# Patient Record
Sex: Female | Born: 1960 | Race: Black or African American | Hispanic: No | Smoking: Never smoker
Health system: Southern US, Community
[De-identification: ages and names within clinical notes are randomized; demographics above are authoritative.]

## PROBLEM LIST (undated history)

## (undated) DIAGNOSIS — F419 Anxiety disorder, unspecified: Secondary | ICD-10-CM

## (undated) DIAGNOSIS — K219 Gastro-esophageal reflux disease without esophagitis: Secondary | ICD-10-CM

## (undated) DIAGNOSIS — K529 Noninfective gastroenteritis and colitis, unspecified: Secondary | ICD-10-CM

## (undated) DIAGNOSIS — D649 Anemia, unspecified: Secondary | ICD-10-CM

## (undated) DIAGNOSIS — N938 Other specified abnormal uterine and vaginal bleeding: Secondary | ICD-10-CM

## (undated) DIAGNOSIS — R51 Headache: Secondary | ICD-10-CM

## (undated) DIAGNOSIS — M797 Fibromyalgia: Secondary | ICD-10-CM

## (undated) DIAGNOSIS — M1990 Unspecified osteoarthritis, unspecified site: Secondary | ICD-10-CM

## (undated) DIAGNOSIS — Z9289 Personal history of other medical treatment: Secondary | ICD-10-CM

## (undated) HISTORY — DX: Fibromyalgia: M79.7

## (undated) HISTORY — PX: COLONOSCOPY: SHX174

## (undated) HISTORY — DX: Noninfective gastroenteritis and colitis, unspecified: K52.9

## (undated) HISTORY — PX: MULTIPLE TOOTH EXTRACTIONS: SHX2053

## (undated) HISTORY — DX: Unspecified osteoarthritis, unspecified site: M19.90

## (undated) HISTORY — DX: Other specified abnormal uterine and vaginal bleeding: N93.8

## (undated) HISTORY — DX: Personal history of other medical treatment: Z92.89

## (undated) HISTORY — DX: Anxiety disorder, unspecified: F41.9

## (undated) HISTORY — DX: Gastro-esophageal reflux disease without esophagitis: K21.9

## (undated) HISTORY — DX: Anemia, unspecified: D64.9

---

## 1998-03-03 HISTORY — PX: TUBAL LIGATION: SHX77

## 1998-05-04 ENCOUNTER — Ambulatory Visit (HOSPITAL_COMMUNITY): Admission: RE | Admit: 1998-05-04 | Discharge: 1998-05-04 | Payer: Self-pay | Admitting: Obstetrics and Gynecology

## 1999-04-19 ENCOUNTER — Encounter: Admission: RE | Admit: 1999-04-19 | Discharge: 1999-04-19 | Payer: Self-pay | Admitting: Family Medicine

## 1999-04-19 ENCOUNTER — Encounter: Payer: Self-pay | Admitting: Family Medicine

## 2000-06-29 ENCOUNTER — Encounter: Payer: Self-pay | Admitting: Obstetrics and Gynecology

## 2000-06-29 ENCOUNTER — Encounter: Admission: RE | Admit: 2000-06-29 | Discharge: 2000-06-29 | Payer: Self-pay | Admitting: Obstetrics and Gynecology

## 2000-07-12 ENCOUNTER — Emergency Department (HOSPITAL_COMMUNITY): Admission: EM | Admit: 2000-07-12 | Discharge: 2000-07-12 | Payer: Self-pay | Admitting: Emergency Medicine

## 2000-07-31 ENCOUNTER — Encounter: Admission: RE | Admit: 2000-07-31 | Discharge: 2000-07-31 | Payer: Self-pay | Admitting: Family Medicine

## 2000-07-31 ENCOUNTER — Encounter: Payer: Self-pay | Admitting: Family Medicine

## 2001-04-23 ENCOUNTER — Encounter: Payer: Self-pay | Admitting: Family Medicine

## 2001-04-23 ENCOUNTER — Encounter: Admission: RE | Admit: 2001-04-23 | Discharge: 2001-04-23 | Payer: Self-pay | Admitting: Family Medicine

## 2001-07-12 ENCOUNTER — Other Ambulatory Visit: Admission: RE | Admit: 2001-07-12 | Discharge: 2001-07-12 | Payer: Self-pay | Admitting: Obstetrics and Gynecology

## 2001-07-26 ENCOUNTER — Encounter: Admission: RE | Admit: 2001-07-26 | Discharge: 2001-07-26 | Payer: Self-pay | Admitting: Family Medicine

## 2001-07-26 ENCOUNTER — Encounter: Payer: Self-pay | Admitting: Family Medicine

## 2002-09-30 ENCOUNTER — Other Ambulatory Visit: Admission: RE | Admit: 2002-09-30 | Discharge: 2002-09-30 | Payer: Self-pay | Admitting: Obstetrics and Gynecology

## 2003-05-05 ENCOUNTER — Encounter: Admission: RE | Admit: 2003-05-05 | Discharge: 2003-05-05 | Payer: Self-pay | Admitting: Family Medicine

## 2005-05-05 ENCOUNTER — Other Ambulatory Visit: Admission: RE | Admit: 2005-05-05 | Discharge: 2005-05-05 | Payer: Self-pay | Admitting: Obstetrics and Gynecology

## 2005-11-13 ENCOUNTER — Encounter: Admission: RE | Admit: 2005-11-13 | Discharge: 2005-11-13 | Payer: Self-pay | Admitting: Family Medicine

## 2005-12-25 ENCOUNTER — Ambulatory Visit: Payer: Self-pay | Admitting: Gastroenterology

## 2006-02-17 ENCOUNTER — Ambulatory Visit: Payer: Self-pay | Admitting: Gastroenterology

## 2011-04-22 ENCOUNTER — Other Ambulatory Visit (HOSPITAL_COMMUNITY): Payer: Self-pay | Admitting: Family Medicine

## 2011-04-22 DIAGNOSIS — N938 Other specified abnormal uterine and vaginal bleeding: Secondary | ICD-10-CM

## 2011-04-22 DIAGNOSIS — D649 Anemia, unspecified: Secondary | ICD-10-CM

## 2011-04-28 ENCOUNTER — Ambulatory Visit (HOSPITAL_COMMUNITY)
Admission: RE | Admit: 2011-04-28 | Discharge: 2011-04-28 | Disposition: A | Discharge status: Home / Self Care | Payer: Self-pay | Source: Ambulatory Visit | Attending: Family Medicine | Admitting: Family Medicine

## 2011-04-28 DIAGNOSIS — N949 Unspecified condition associated with female genital organs and menstrual cycle: Secondary | ICD-10-CM | POA: Insufficient documentation

## 2011-04-28 DIAGNOSIS — D649 Anemia, unspecified: Secondary | ICD-10-CM | POA: Insufficient documentation

## 2011-04-28 DIAGNOSIS — N938 Other specified abnormal uterine and vaginal bleeding: Secondary | ICD-10-CM | POA: Insufficient documentation

## 2011-05-14 ENCOUNTER — Encounter (HOSPITAL_COMMUNITY): Payer: Self-pay | Admitting: *Deleted

## 2011-05-14 ENCOUNTER — Inpatient Hospital Stay (HOSPITAL_COMMUNITY)
Admission: AD | Admit: 2011-05-14 | Discharge: 2011-05-14 | Disposition: A | Discharge status: Home / Self Care | Payer: Self-pay | Source: Ambulatory Visit | Attending: Obstetrics & Gynecology | Admitting: Obstetrics & Gynecology

## 2011-05-14 DIAGNOSIS — N921 Excessive and frequent menstruation with irregular cycle: Secondary | ICD-10-CM

## 2011-05-14 DIAGNOSIS — N920 Excessive and frequent menstruation with regular cycle: Secondary | ICD-10-CM | POA: Insufficient documentation

## 2011-05-14 HISTORY — DX: Headache: R51

## 2011-05-14 LAB — WET PREP, GENITAL
Clue Cells Wet Prep HPF POC: NONE SEEN
Yeast Wet Prep HPF POC: NONE SEEN

## 2011-05-14 LAB — CBC
MCH: 26.1 pg (ref 26.0–34.0)
MCV: 82.6 fL (ref 78.0–100.0)
Platelets: 329 10*3/uL (ref 150–400)
RDW: 15.2 % (ref 11.5–15.5)
WBC: 6.6 10*3/uL (ref 4.0–10.5)

## 2011-05-14 MED ORDER — MEDROXYPROGESTERONE ACETATE 10 MG PO TABS: 10.0000 mg | ORAL_TABLET | Freq: Two times a day (BID) | ORAL | Status: DC

## 2011-05-14 NOTE — Discharge Instructions (Signed)
Endometrial Biopsy This is a test in which a tissue sample (a biopsy) is taken from inside the uterus (womb). It is then looked at by a specialist under a microscope to see if the tissue is normal or abnormal. The endometrium is the lining of the uterus. This test helps determine where you are in your menstrual cycle and how hormone levels are affecting the lining of the uterus. Another use for this test is to diagnose endometrial cancer, tuberculosis, polyps, or inflammatory conditions and to evaluate uterine bleeding. PREPARATION FOR TEST No preparation or fasting is necessary. NORMAL FINDINGS No pathologic conditions. Presence of "secretory-type" endometrium 3 to 5 days before to normal menstruation. Ranges for normal findings may vary among different laboratories and hospitals. You should always check with your doctor after having lab work or other tests done to discuss the meaning of your test results and whether your values are considered within normal limits. MEANING OF TEST  Your caregiver will go over the test results with you and discuss the importance and meaning of your results, as well as treatment options and the need for additional tests if necessary. OBTAINING THE TEST RESULTS It is your responsibility to obtain your test results. Ask the lab or department performing the test when and how you will get your results. Document Released: 06/20/2004 Document Revised: 02/06/2011 Document Reviewed: 01/28/2008 [employer] Patient Information 2012 [employer]. 

## 2011-05-14 NOTE — MAU Note (Signed)
Pt has been taking Provera for the last 7 days, still bleeding.

## 2011-05-14 NOTE — MAU Provider Note (Signed)
History   Pt presents today c/o continued heavy vag bleeding. She is currently being seen by a family practice physician and is being treated with provera. She states she still has three days left of the medication and she continues to bleed. She also reports having a recent ultrasound and she was told everything was "ok." She denies abd pain, vag irritation, or any other sx.  CSN: [id number]  Arrival date and time: 05/14/11 1145   First Provider Initiated Contact with Patient 05/14/11 1246      Chief Complaint  Patient presents with  . Vaginal Bleeding   HPI  OB History    Grav Para Term Preterm Abortions TAB SAB Ect Mult Living   1 1 1       1       Past Medical History  Diagnosis Date  . Asthma   . Headache     Past Surgical History  Procedure Date  . Cesarean section   . Tubal ligation     History reviewed. No pertinent family history.  History  Substance Use Topics  . Smoking status: Never Smoker   . Smokeless tobacco: Not on file  . Alcohol Use: No    Allergies:  Allergies  Allergen Reactions  . Penicillins Nausea And Vomiting    Prescriptions prior to admission  Medication Sig Dispense Refill  . medroxyPROGESTERone (PROVERA) 10 MG tablet Take 10 mg by mouth daily. Patient takes one tablet days 1-10,she is on day 7 of ten        Review of Systems  Constitutional: Negative for fever and chills.  Eyes: Negative for blurred vision and double vision.  Respiratory: Negative for cough, hemoptysis, sputum production, shortness of breath and wheezing.   Cardiovascular: Negative for chest pain and palpitations.  Gastrointestinal: Negative for nausea, vomiting, abdominal pain, diarrhea and constipation.  Genitourinary: Negative for dysuria, urgency, frequency and hematuria.  Neurological: Negative for dizziness and headaches.  Psychiatric/Behavioral: Negative for depression and suicidal ideas.   Physical Exam   Blood pressure 128/70, pulse 73, temperature  99.4 F (37.4 C), temperature source Oral, resp. rate 20, height 5' 5"  (1.651 m), weight 195 lb 6.4 oz (88.633 kg), last menstrual period 04/28/2011, SpO2 100.00%.  Physical Exam  Nursing note and vitals reviewed. Constitutional: She is oriented to person, place, and time. She appears well-developed and well-nourished. No distress.  HENT:  Head: Normocephalic and atraumatic.  Eyes: EOM are normal. Pupils are equal, round, and reactive to light.  GI: Soft. She exhibits no distension and no mass. There is no tenderness. There is no rebound and no guarding.  Genitourinary: There is bleeding around the vagina. No vaginal discharge found.       Cervix Lg/closed. Uterus top-normal size with no adnexal masses. Pt non-tender to palpation.  Neurological: She is alert and oriented to person, place, and time.  Skin: Skin is warm and dry. She is not diaphoretic.  Psychiatric: She has a normal mood and affect. Her behavior is normal. Judgment and thought content normal.    MAU Course  Procedures  Wet prep and GC/Chlamydia cultures done.  Results for orders placed during the hospital encounter of 05/14/11 (from the past 24 hour(s))  WET PREP, GENITAL     Status: Abnormal   Collection Time   05/14/11  1:02 PM      Component Value Range   Yeast Wet Prep HPF POC NONE SEEN  NONE SEEN    Trich, Wet Prep NONE SEEN  NONE SEEN  Clue Cells Wet Prep HPF POC NONE SEEN  NONE SEEN    WBC, Wet Prep HPF POC FEW (*) NONE SEEN   CBC     Status: Abnormal   Collection Time   05/14/11  1:05 PM      Component Value Range   WBC 6.6  4.0 - 10.5 (K/uL)   RBC 3.10 (*) 3.87 - 5.11 (MIL/uL)   Hemoglobin 8.1 (*) 12.0 - 15.0 (g/dL)   HCT 25.6 (*) 36.0 - 46.0 (%)   MCV 82.6  78.0 - 100.0 (fL)   MCH 26.1  26.0 - 34.0 (pg)   MCHC 31.6  30.0 - 36.0 (g/dL)   RDW 15.2  11.5 - 15.5 (%)   Platelets 329  150 - 400 (K/uL)   Previous US reviewed from 04/22/11 which showed possible adenomyosis.  Assessment and Plan  Anemia:  discussed with pt at length. Will start iron supplementation.  Irregular vag bleeding: pt needs an endometrial biopsy. Will have her f/u in the GYN clinic. Will increase her provera to 4m daily. Discussed diet, activity, risks, and precautions.  EAlinda Deem Brookes Craine III, DrHSc, MPAS, PA-C  05/14/2011, 1:09 PM

## 2011-05-14 NOTE — MAU Provider Note (Signed)
Attestation of Attending Supervision of Advanced Practitioner: Evaluation and management procedures were performed by the PA/NP/CNM/OB Fellow under my supervision/collaboration. Chart reviewed, and agree with management and plan.  Verita Schneiders, M.D. 05/14/2011 1:42 PM

## 2011-05-14 NOTE — MAU Note (Signed)
Patient states she has been having almost constant bleeding since December. Was seen in MAU on 2-25 and has been bleeding daily since. Heavy, especially in the am, changing pads at least every hour. No pain.

## 2011-05-15 LAB — GC/CHLAMYDIA PROBE AMP, GENITAL: GC Probe Amp, Genital: UNDETERMINED

## 2011-06-04 ENCOUNTER — Ambulatory Visit (INDEPENDENT_AMBULATORY_CARE_PROVIDER_SITE_OTHER): Payer: Self-pay | Admitting: Obstetrics and Gynecology

## 2011-06-04 ENCOUNTER — Encounter: Payer: Self-pay | Admitting: Obstetrics and Gynecology

## 2011-06-04 ENCOUNTER — Other Ambulatory Visit (HOSPITAL_COMMUNITY)
Admission: RE | Admit: 2011-06-04 | Discharge: 2011-06-04 | Disposition: A | Discharge status: Home / Self Care | Payer: Self-pay | Source: Ambulatory Visit | Attending: Obstetrics & Gynecology | Admitting: Obstetrics & Gynecology

## 2011-06-04 VITALS — BP 135/82 | HR 68 | Temp 96.8°F | Resp 20 | Ht 64.5 in | Wt 194.2 lb

## 2011-06-04 DIAGNOSIS — N938 Other specified abnormal uterine and vaginal bleeding: Secondary | ICD-10-CM

## 2011-06-04 DIAGNOSIS — N939 Abnormal uterine and vaginal bleeding, unspecified: Secondary | ICD-10-CM

## 2011-06-04 DIAGNOSIS — N926 Irregular menstruation, unspecified: Secondary | ICD-10-CM

## 2011-06-04 DIAGNOSIS — N949 Unspecified condition associated with female genital organs and menstrual cycle: Secondary | ICD-10-CM | POA: Insufficient documentation

## 2011-06-04 DIAGNOSIS — Z01812 Encounter for preprocedural laboratory examination: Secondary | ICD-10-CM

## 2011-06-04 LAB — POCT PREGNANCY, URINE: Preg Test, Ur: NEGATIVE

## 2011-06-04 NOTE — Progress Notes (Signed)
Addended by: Michel Harrow on: 06/04/2011 03:22 PM   Modules accepted: Orders

## 2011-06-04 NOTE — Progress Notes (Signed)
S: Pt presents today for an endometrial biopsy. She has a long hx of heavy, irregular vag bleeding. She has been placed on provera on multiple occasions without relief. She also reports having a normal pelvic US. She has no other complaints and states she is not bleeding currently. O: VSS A&O x 3 in NAD Abd soft, non-tender to palpation. NL external genitalia. Time-out procedure done. Cervix easily visualized. Betadine used to clean cervix. Uterus sounded to 7cm. Tenaculum was placed on posterior lip of her cervix. Endometrial biopsy was then performed without difficulty. Pt tolerated procedure well. All instrumentation was then removed from the vagina without difficulty. A/P: DUB: pt will return in 2wks to discuss results of biopsy. Discussed post-procedure instructions and precautions. Discussed diet, activity, risks, and precautions.  Alinda Deem. Shante Maysonet III, DrHSc, MPAS, PA-C

## 2011-06-04 NOTE — Progress Notes (Signed)
UPT = Negative today

## 2011-06-26 ENCOUNTER — Encounter: Payer: Self-pay | Admitting: Advanced Practice Midwife

## 2011-06-26 ENCOUNTER — Ambulatory Visit (INDEPENDENT_AMBULATORY_CARE_PROVIDER_SITE_OTHER): Payer: Self-pay | Admitting: Advanced Practice Midwife

## 2011-06-26 VITALS — BP 129/95 | HR 69 | Temp 98.3°F | Ht 64.0 in | Wt 197.1 lb

## 2011-06-26 DIAGNOSIS — N938 Other specified abnormal uterine and vaginal bleeding: Secondary | ICD-10-CM

## 2011-06-26 DIAGNOSIS — N926 Irregular menstruation, unspecified: Secondary | ICD-10-CM

## 2011-06-26 MED ORDER — MEDROXYPROGESTERONE ACETATE 10 MG PO TABS: 20.0000 mg | ORAL_TABLET | Freq: Every day | ORAL | Status: DC

## 2011-07-01 NOTE — Progress Notes (Signed)
  Subjective:    Patient ID: Amanda Dickerson, female    DOB: December 22, 1960, 51 y.o.   MRN: [id number]  HPI: Pt is here for results of EBX from previous visit. Long Hx of Menorrhagia.     Review of Systems: Denies dizziness.      Objective:   Physical ExamA&OX$, NAD, no palor.  ENX normal.     Assessment & Plan:   1. DUB (dysfunctional uterine bleeding)  medroxyPROGESTERone (PROVERA) 10 MG tablet   Plan: Discussed hormones (including Mirena), vs ablation vs hysterectomy including R/B/I of each. Pt desires ablation.  F/U w/ surgeon ASAP Rx Provera for heavy bleeding in interim.  Bleeding prescautions.  Manya Silvas 06/26/11

## 2011-07-18 ENCOUNTER — Telehealth: Payer: Self-pay | Admitting: Emergency Medicine

## 2011-07-18 ENCOUNTER — Ambulatory Visit (INDEPENDENT_AMBULATORY_CARE_PROVIDER_SITE_OTHER): Payer: Self-pay | Admitting: Emergency Medicine

## 2011-07-18 ENCOUNTER — Encounter: Payer: Self-pay | Admitting: Emergency Medicine

## 2011-07-18 VITALS — BP 143/84 | HR 68 | Temp 98.2°F | Ht 66.5 in | Wt 198.0 lb

## 2011-07-18 DIAGNOSIS — Z0000 Encounter for general adult medical examination without abnormal findings: Secondary | ICD-10-CM

## 2011-07-18 DIAGNOSIS — Z01419 Encounter for gynecological examination (general) (routine) without abnormal findings: Secondary | ICD-10-CM | POA: Insufficient documentation

## 2011-07-18 DIAGNOSIS — IMO0001 Reserved for inherently not codable concepts without codable children: Secondary | ICD-10-CM

## 2011-07-18 DIAGNOSIS — N926 Irregular menstruation, unspecified: Secondary | ICD-10-CM

## 2011-07-18 DIAGNOSIS — N938 Other specified abnormal uterine and vaginal bleeding: Secondary | ICD-10-CM | POA: Insufficient documentation

## 2011-07-18 DIAGNOSIS — M797 Fibromyalgia: Secondary | ICD-10-CM | POA: Insufficient documentation

## 2011-07-18 DIAGNOSIS — N939 Abnormal uterine and vaginal bleeding, unspecified: Secondary | ICD-10-CM

## 2011-07-18 DIAGNOSIS — R42 Dizziness and giddiness: Secondary | ICD-10-CM

## 2011-07-18 MED ORDER — VENLAFAXINE HCL ER 37.5 MG PO CP24: 37.5000 mg | ORAL_CAPSULE | Freq: Every day | ORAL | Status: DC

## 2011-07-18 MED ORDER — VENLAFAXINE HCL 37.5 MG PO TABS: 37.5000 mg | ORAL_TABLET | Freq: Two times a day (BID) | ORAL | Status: DC

## 2011-07-18 NOTE — Assessment & Plan Note (Signed)
Pre-syncope vs mild vertigo.  Pre-syncope more likely.  Instructed her to drink at least 3-4 bottles of water a day.  She is to keep a log of these episodes and associated symptoms.  Return in about 1 month.

## 2011-07-18 NOTE — Patient Instructions (Signed)
It was nice to meet you!  Fibromyalgia - We are going to start the medicine Effexor at 37.23m twice a day.  It may make you tired or dizzy.  We are starting at a very low dose, so these will hopefully not be a problem.  It will take 1-2 weeks to see a difference in the pain.  It is also important to get good sleep at night and be as active as possible.  Hopefully, the Effexor will decrease your pain enough that you will be able to exercise more.  Dizziness - I would like for you to drink at least 3-4 bottles of water a day.  I am not sure what is going on with the dizziness.  Over the next few weeks please pay attention to these dizzy spells and write down how often they happen, any triggers, and any associated symptoms.  I have ordered some fasting labs (nothing to eat or drink except water for the 8hrs prior to lab draw).  Once you get the orange card, you can make a lab appointment to get them drawn.   I will see you back in about 1 month.

## 2011-07-18 NOTE — Telephone Encounter (Signed)
Patient is calling because she needs the Rx for Effexor to read for the long lasting medication in order to be able to get it through the [facility].  The fax # is [phone].
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
## 2011-07-18 NOTE — Assessment & Plan Note (Addendum)
Up to date except for TDaP, will address at next visit.  Will check lipids and cmp when fasting and after gets orange card.

## 2011-07-18 NOTE — Assessment & Plan Note (Signed)
Unclear if definitively fibromyalgia vs chronic MSK pain.  Will start Effexor XR 37.71m titrate to 742mafter one week.  Also encouraged exercise and good sleep.

## 2011-07-18 NOTE — Telephone Encounter (Signed)
Will change prescription to Effexor XR 37.65m daily, increase to 776mdaily in 1 week.  Prescription placed in to be faxed box.

## 2011-07-18 NOTE — Assessment & Plan Note (Signed)
Managed at [facility].  Endometrial biopsy normal.  Scheduled for an ablation.

## 2011-07-25 ENCOUNTER — Encounter: Payer: Self-pay | Admitting: Obstetrics & Gynecology

## 2011-07-25 ENCOUNTER — Ambulatory Visit (INDEPENDENT_AMBULATORY_CARE_PROVIDER_SITE_OTHER): Payer: Self-pay | Admitting: Obstetrics & Gynecology

## 2011-07-25 VITALS — BP 134/85 | HR 64 | Temp 97.8°F | Resp 12 | Ht 65.0 in | Wt 198.3 lb

## 2011-07-25 DIAGNOSIS — N938 Other specified abnormal uterine and vaginal bleeding: Secondary | ICD-10-CM

## 2011-07-25 DIAGNOSIS — N939 Abnormal uterine and vaginal bleeding, unspecified: Secondary | ICD-10-CM

## 2011-07-25 DIAGNOSIS — Z01818 Encounter for other preprocedural examination: Secondary | ICD-10-CM

## 2011-07-25 DIAGNOSIS — N926 Irregular menstruation, unspecified: Secondary | ICD-10-CM

## 2011-07-25 NOTE — Progress Notes (Signed)
History:  51 y.o. G1P1001 here today for preoperative examination for and discussion about endometrial ablation for menorrhagia. She does not currently have any bleeding.  No abdominal pain, abnormal discharge or other concerns.  The following portions of the patient's history were reviewed and updated as appropriate: allergies, current medications, past family history, past medical history, past social history, past surgical history and problem list.  Review of Systems:  Pertinent items are noted in HPI.  Objective:  Physical Exam Blood pressure 134/85, pulse 64, temperature 97.8 F (36.6 C), temperature source Oral, resp. rate 12, height 5' 5"  (1.651 m), weight 198 lb 4.8 oz (89.948 kg), last menstrual period 04/28/2011. Gen: NAD Abd: Soft, nontender and nondistended Pelvic: Normal appearing external genitalia; normal appearing vaginal mucosa and cervix.  Normal discharge.  Small anteverted uterus, no other palpable masses, no uterine or adnexal tenderness  Labs and Imaging 06/04/11 Endometrium, biopsy BENIGN PROLIFERATIVE ENDOMETRIUM. NO ATYPIA, HYPERPLASIA OR MALIGNANCY.  04/28/11 TRANSABDOMINAL AND TRANSVAGINAL ULTRASOUND OF PELVIS  Findings: Uterus: 9.8 x 4.7 x 5.4 cm. The uterine myometrium has a heterogeneous echotexture, particularly in the posterior corpus. Ill-defined increased echogenicity and tiny cystic foci are seen in the posterior uterine myometrium, suspicious for adenomyosis. No discrete fibroids identified. Endometrium: Double layer endometrial thickness measures 16 mm. No focal lesion visualized.  Right ovary: not directly visualized by transabdominal or  transvaginal sonography, however no adnexal mass identified. Left ovary: not directly visualized by transabdominal or transvaginal sonography, however no adnexal mass identified. Other Findings: No free fluid  IMPRESSION: 1. Endometrial thickness measures 16 mm. If bleeding is unresponsive to hormonal or medical therapy,  endometrial sampling should be considered in patients at high risk for endometrial carcinoma, or sonohysterogram could be performed for focal lesion work-up. 2. Heterogeneous increased echogenicity and tiny cystic foci in the posterior uterine myometrium, suspicious for adenomyosis. No definite fibroids identified. Pelvic MRI could be performed for improved characterization if clinically warranted. 3. Nonvisualization of ovaries, however no adnexal mass identified.   Assessment & Plan:  Patient desires surgical management with endometrial ablation. Discussed different modalities of endometrial ablation, differences highlighted. Patient desires Novasure endometrial ablation.  Risks of surgery were discussed with the patient including but not limited to: bleeding which may require transfusion; infection which may require antibiotics; injury to uterus leading to risk of injury to surrounding intraperitoneal organs, need for additional procedures including laparoscopy or laparotomy, and other postoperative/anesthesia complications.  Patient was told that the likelihood that her condition and symptoms will be treated effectively with this surgical management was very high; the postoperative expectations were also discussed in detail. The patient also understands the alternative treatment options which were discussed in full. All questions were answered.  She was told that she will be contacted by our surgical scheduler regarding the time and date of her surgery; routine preoperative instructions of having nothing to eat or drink after midnight on the day prior to surgery and also coming to the hospital 1 1/2 hours prior to her time of surgery were also emphasized.  She was told she may be called for a preoperative appointment about a week prior to surgery and will be given further preoperative instructions at that visit. Printed patient education handout about the Novasure procedure were given to the patient to review  at home.

## 2011-07-25 NOTE — Patient Instructions (Signed)
Endometrial Ablation Endometrial ablation removes the lining of the uterus (endometrium). It is usually a same day, outpatient treatment. Ablation helps avoid major surgery (such as a hysterectomy). A hysterectomy is removal of the cervix and uterus. Endometrial ablation has less risk and complications, has a shorter recovery period and is less expensive. After endometrial ablation, most women will have little or no menstrual bleeding. You may not keep your fertility. Pregnancy is no longer likely after this procedure but if you are pre-menopausal, you still need to use a reliable method of birth control following the procedure because pregnancy can occur. REASONS TO HAVE THE PROCEDURE MAY INCLUDE:  Heavy periods.   Bleeding that is causing anemia.   Anovulatory bleeding, very irregular, bleeding.   Bleeding submucous fibroids (on the lining inside the uterus) if they are smaller than 3 centimeters.  REASONS NOT TO HAVE THE PROCEDURE MAY INCLUDE:  You wish to have more children.   You have a pre-cancerous or cancerous problem. The cause of any abnormal bleeding must be diagnosed before having the procedure.   You have pain coming from the uterus.   You have a submucus fibroid larger than 3 centimeters.   You recently had a baby.   You recently had an infection in the uterus.   You have a severe retro-flexed, tipped uterus and cannot insert the instrument to do the ablation.   You had a Cesarean section or deep major surgery on the uterus.   The inner cavity of the uterus is too large for the endometrial ablation instrument.  RISKS AND COMPLICATIONS   Perforation of the uterus.   Bleeding.   Infection of the uterus, bladder or vagina.   Injury to surrounding organs.   Cutting the cervix.   An air bubble to the lung (air embolus).   Pregnancy following the procedure.   Failure of the procedure to help the problem requiring hysterectomy.   Decreased ability to diagnose  cancer in the lining of the uterus.  BEFORE THE PROCEDURE  The lining of the uterus must be tested to make sure there is no pre-cancerous or cancer cells present.   Medications may be given to make the lining of the uterus thinner.   Ultrasound may be used to evaluate the size and look for abnormalities of the uterus.   Future pregnancy is not desired.  PROCEDURE  There are different ways to destroy the lining of the uterus.   Resectoscope - radio frequency-alternating electric current is the most common one used.   Cryotherapy - freezing the lining of the uterus.   Heated Free Liquid - heated salt (saline) solution inserted into the uterus.   Wire mesh - uses high energy current through wire mesh conduction in the uterus.   Thermal Balloon - a catheter with a balloon tip is inserted into the uterus and filled with heated fluid.  Your caregiver will talk with you about the method used in this clinic. They will also instruct you on the pros and cons of the procedure. Endometrial ablation is performed along with a procedure called operative hysteroscopy. A narrow viewing tube is inserted through the birth canal (vagina) and through the cervix into the uterus. A tiny camera attached to the viewing tube (hysteroscope) allows the uterine cavity to be shown on a TV monitor during surgery. Your uterus is filled with a harmless liquid to make the procedure easier. The lining of the uterus is then removed. The lining can also be removed with a  resectoscope which allows your surgeon to cut away the lining of the uterus under direct vision. Usually, you will be able to go home within an hour after the procedure. HOME CARE INSTRUCTIONS   Do not drive for 24 hours.   No tampons, douching or intercourse for 2 weeks or until your caregiver approves.   Rest at home for 24 to 48 hours. You may then resume normal activities unless told differently by your caregiver.   Take your temperature two times a  day for 4 days, and record it.   Take any medications your caregiver has ordered, as directed.   Use some form of contraception if you are pre-menopausal and do not want to get pregnant.  Bleeding after the procedure is normal. It varies from light spotting and mildly watery to bloody discharge for 4 to 6 weeks. You may also have mild cramping. Only take over-the-counter or prescription medicines for pain, discomfort, or fever as directed by your caregiver. Do not use aspirin, as this may aggravate bleeding. Frequent urination during the first 24 hours is normal. You will not know how effective your surgery is until at least 3 months after the surgery. SEEK IMMEDIATE MEDICAL CARE IF:   Bleeding is heavier than a normal menstrual cycle.   An oral temperature above 102 F (38.9 C) develops.   You have increasing cramps or pains not relieved with medication or develop belly (abdominal) pain which does not seem to be related to the same area of earlier cramping and pain.   You are light headed, weak or have fainting episodes.   You develop pain in the shoulder strap areas.   You have chest or leg pain.   You have abnormal vaginal discharge.   You have painful urination.  Document Released: 12/28/2003 Document Revised: 02/06/2011 Document Reviewed: 03/27/2007 [facility] Patient Information 2012 [place].

## 2011-08-02 HISTORY — PX: ENDOMETRIAL ABLATION W/ NOVASURE: SUR434

## 2011-08-06 ENCOUNTER — Telehealth: Payer: Self-pay | Admitting: *Deleted

## 2011-08-06 ENCOUNTER — Ambulatory Visit: Payer: Self-pay | Admitting: Family Medicine

## 2011-08-06 ENCOUNTER — Encounter: Payer: Self-pay | Admitting: Physician Assistant

## 2011-08-06 ENCOUNTER — Encounter (HOSPITAL_COMMUNITY): Payer: Self-pay

## 2011-08-06 ENCOUNTER — Ambulatory Visit (INDEPENDENT_AMBULATORY_CARE_PROVIDER_SITE_OTHER): Payer: Self-pay | Admitting: Physician Assistant

## 2011-08-06 VITALS — BP 133/85 | HR 69 | Temp 98.3°F | Resp 16 | Ht 64.5 in | Wt 197.2 lb

## 2011-08-06 DIAGNOSIS — Z113 Encounter for screening for infections with a predominantly sexual mode of transmission: Secondary | ICD-10-CM

## 2011-08-06 NOTE — Progress Notes (Signed)
Chief Complaint:  Follow-up   Amanda Dickerson is  51 y.o. G1P1001.  Patient's last menstrual period was 04/28/2011..  She presents complaining of Follow-up  Presents for gc/chl screening only secondary to indeterminate test in 05/2011. Pt scheduled for Novasure with Dr. Harolyn Rutherford 08/18/10. No complaints.  Obstetrical/Gynecological History: OB History    Grav Para Term Preterm Abortions TAB SAB Ect Mult Living   1 1 1       1       Past Medical History: Past Medical History  Diagnosis Date  . Asthma   . Headache   . Fibromyalgia   . Dysfunctional uterine bleeding     Past Surgical History: Past Surgical History  Procedure Date  . Cesarean section 1988  . Tubal ligation 2000  . Multiple tooth extractions     Family History: Family History  Problem Relation Age of Onset  . Hypertension Mother   . Heart disease Mother   . Hyperlipidemia Mother   . Diabetes Father   . Cancer Father     prostate    Social History: History  Substance Use Topics  . Smoking status: Never Smoker   . Smokeless tobacco: Never Used  . Alcohol Use: Yes     occasionally    Allergies:  Allergies  Allergen Reactions  . Penicillins Nausea And Vomiting     (Not in a hospital admission)  Review of Systems - Negative except what has been reviewed in the HPI  Physical Exam   Blood pressure 133/85, pulse 69, temperature 98.3 F (36.8 C), temperature source Oral, resp. rate 16, height 5' 4.5" (1.638 m), weight 197 lb 3.2 oz (89.449 kg), last menstrual period 04/28/2011.  General: General appearance - alert, well appearing, and in no distress, oriented to person, place, and time and overweight Mental status - alert, oriented to person, place, and time, normal mood, behavior, speech, dress, motor activity, and thought processes, affect appropriate to mood Focused Gynecological Exam: VULVA: normal appearing vulva with no masses, tenderness or lesions, VAGINA: normal appearing vagina with normal  color and discharge, no lesions, CERVIX: normal appearing cervix without discharge or lesions  Assessment: 1. Routine screening for STI (sexually transmitted infection)  GC/chlamydia probe amp, genital    Plan: Follow up as schedule for pre-op and surgery as scheduled. Clinic staff will call with positive results only  [place]. 08/06/2011,2:26 PM

## 2011-08-06 NOTE — Telephone Encounter (Signed)
Message copied by Samuel Germany on Wed Aug 06, 2011 10:31 AM ------      Message from: Verita Schneiders A      Created: Wed Aug 06, 2011  8:15 AM       GC/CT cultures indeterminate.  Patient needs to be tested again prior to surgery.  Needs appointment with any provider to get cervical cultures.

## 2011-08-06 NOTE — Patient Instructions (Signed)
Return as scheduled for surgical procedure with Dr. Harolyn Rutherford  Endometrial Ablation Endometrial ablation removes the lining of the uterus (endometrium). It is usually a same day, outpatient treatment. Ablation helps avoid major surgery (such as a hysterectomy). A hysterectomy is removal of the cervix and uterus. Endometrial ablation has less risk and complications, has a shorter recovery period and is less expensive. After endometrial ablation, most women will have little or no menstrual bleeding. You may not keep your fertility. Pregnancy is no longer likely after this procedure but if you are pre-menopausal, you still need to use a reliable method of birth control following the procedure because pregnancy can occur. REASONS TO HAVE THE PROCEDURE MAY INCLUDE:  Heavy periods.   Bleeding that is causing anemia.   Anovulatory bleeding, very irregular, bleeding.   Bleeding submucous fibroids (on the lining inside the uterus) if they are smaller than 3 centimeters.  REASONS NOT TO HAVE THE PROCEDURE MAY INCLUDE:  You wish to have more children.   You have a pre-cancerous or cancerous problem. The cause of any abnormal bleeding must be diagnosed before having the procedure.   You have pain coming from the uterus.   You have a submucus fibroid larger than 3 centimeters.   You recently had a baby.   You recently had an infection in the uterus.   You have a severe retro-flexed, tipped uterus and cannot insert the instrument to do the ablation.   You had a Cesarean section or deep major surgery on the uterus.   The inner cavity of the uterus is too large for the endometrial ablation instrument.  RISKS AND COMPLICATIONS   Perforation of the uterus.   Bleeding.   Infection of the uterus, bladder or vagina.   Injury to surrounding organs.   Cutting the cervix.   An air bubble to the lung (air embolus).   Pregnancy following the procedure.   Failure of the procedure to help the  problem requiring hysterectomy.   Decreased ability to diagnose cancer in the lining of the uterus.  BEFORE THE PROCEDURE  The lining of the uterus must be tested to make sure there is no pre-cancerous or cancer cells present.   Medications may be given to make the lining of the uterus thinner.   Ultrasound may be used to evaluate the size and look for abnormalities of the uterus.   Future pregnancy is not desired.  PROCEDURE  There are different ways to destroy the lining of the uterus.   Resectoscope - radio frequency-alternating electric current is the most common one used.   Cryotherapy - freezing the lining of the uterus.   Heated Free Liquid - heated salt (saline) solution inserted into the uterus.   Microwave - uses high energy microwaves in the uterus.   Thermal Balloon - a catheter with a balloon tip is inserted into the uterus and filled with heated fluid.  Your caregiver will talk with you about the method used in this clinic. They will also instruct you on the pros and cons of the procedure. Endometrial ablation is performed along with a procedure called operative hysteroscopy. A narrow viewing tube is inserted through the birth canal (vagina) and through the cervix into the uterus. A tiny camera attached to the viewing tube (hysteroscope) allows the uterine cavity to be shown on a TV monitor during surgery. Your uterus is filled with a harmless liquid to make the procedure easier. The lining of the uterus is then removed. The lining can  also be removed with a resectoscope which allows your surgeon to cut away the lining of the uterus under direct vision. Usually, you will be able to go home within an hour after the procedure. HOME CARE INSTRUCTIONS   Do not drive for 24 hours.   No tampons, douching or intercourse for 2 weeks or until your caregiver approves.   Rest at home for 24 to 48 hours. You may then resume normal activities unless told differently by your caregiver.    Take your temperature two times a day for 4 days, and record it.   Take any medications your caregiver has ordered, as directed.   Use some form of contraception if you are pre-menopausal and do not want to get pregnant.  Bleeding after the procedure is normal. It varies from light spotting and mildly watery to bloody discharge for 4 to 6 weeks. You may also have mild cramping. Only take over-the-counter or prescription medicines for pain, discomfort, or fever as directed by your caregiver. Do not use aspirin, as this may aggravate bleeding. Frequent urination during the first 24 hours is normal. You will not know how effective your surgery is until at least 3 months after the surgery. SEEK IMMEDIATE MEDICAL CARE IF:   Bleeding is heavier than a normal menstrual cycle.   An oral temperature above 102 F (38.9 C) develops.   You have increasing cramps or pains not relieved with medication or develop belly (abdominal) pain which does not seem to be related to the same area of earlier cramping and pain.   You are light headed, weak or have fainting episodes.   You develop pain in the shoulder strap areas.   You have chest or leg pain.   You have abnormal vaginal discharge.   You have painful urination.  Document Released: 12/28/2003 Document Revised: 02/06/2011 Document Reviewed: 03/27/2007 [facility] Patient Information 2012 [place].

## 2011-08-06 NOTE — Telephone Encounter (Signed)
Called patient back in regard of questions if there will be Rx prescribed postop because she gets her meds through mail and needs them written ahead of time. Also needs to make appt. For repeat GC/CT cultures. Left message for patient to call us back.

## 2011-08-06 NOTE — Progress Notes (Signed)
Pt only needs GC/Chlamydia testing today due to indeterminate results from 05/14/11.  Surgery is posted for 08/18/11.

## 2011-08-18 ENCOUNTER — Encounter (HOSPITAL_COMMUNITY): Payer: Self-pay | Admitting: Anesthesiology

## 2011-08-18 ENCOUNTER — Ambulatory Visit (HOSPITAL_COMMUNITY): Payer: Self-pay | Admitting: Anesthesiology

## 2011-08-18 ENCOUNTER — Ambulatory Visit (HOSPITAL_COMMUNITY)
Admission: RE | Admit: 2011-08-18 | Discharge: 2011-08-18 | Disposition: A | Discharge status: Home / Self Care | Payer: Self-pay | Source: Ambulatory Visit | Attending: Obstetrics & Gynecology | Admitting: Obstetrics & Gynecology

## 2011-08-18 ENCOUNTER — Encounter (HOSPITAL_COMMUNITY)
Admission: RE | Disposition: A | Discharge status: Home / Self Care | Payer: Self-pay | Source: Ambulatory Visit | Attending: Obstetrics & Gynecology

## 2011-08-18 DIAGNOSIS — N938 Other specified abnormal uterine and vaginal bleeding: Secondary | ICD-10-CM | POA: Diagnosis present

## 2011-08-18 DIAGNOSIS — N949 Unspecified condition associated with female genital organs and menstrual cycle: Secondary | ICD-10-CM

## 2011-08-18 DIAGNOSIS — N920 Excessive and frequent menstruation with regular cycle: Secondary | ICD-10-CM | POA: Insufficient documentation

## 2011-08-18 LAB — CBC
HCT: 40.9 % (ref 36.0–46.0)
Hemoglobin: 13.8 g/dL (ref 12.0–15.0)
MCV: 79.4 fL (ref 78.0–100.0)
Platelets: 137 10*3/uL — ABNORMAL LOW (ref 150–400)
RBC: 5.15 MIL/uL — ABNORMAL HIGH (ref 3.87–5.11)
WBC: 3.9 10*3/uL — ABNORMAL LOW (ref 4.0–10.5)

## 2011-08-18 SURGERY — DILATATION & CURETTAGE/HYSTEROSCOPY WITH NOVASURE ABLATION
Anesthesia: General | Site: Uterus | Wound class: Clean Contaminated

## 2011-08-18 MED ORDER — IBUPROFEN 600 MG PO TABS: 600.0000 mg | ORAL_TABLET | Freq: Four times a day (QID) | ORAL | Status: AC | PRN

## 2011-08-18 MED ORDER — MEPERIDINE HCL 25 MG/ML IJ SOLN: 6.2500 mg | INTRAMUSCULAR | Status: DC | PRN

## 2011-08-18 MED ORDER — LIDOCAINE HCL (CARDIAC) 20 MG/ML IV SOLN
INTRAVENOUS | Status: DC | PRN
  Administered 2011-08-18: 50 mg via INTRAVENOUS

## 2011-08-18 MED ORDER — LACTATED RINGERS IV SOLN: INTRAVENOUS | Status: DC

## 2011-08-18 MED ORDER — DEXAMETHASONE SODIUM PHOSPHATE 10 MG/ML IJ SOLN
INTRAMUSCULAR | Status: AC
  Filled 2011-08-18: qty 1

## 2011-08-18 MED ORDER — OXYCODONE-ACETAMINOPHEN 5-325 MG PO TABS: 1.0000 | ORAL_TABLET | Freq: Four times a day (QID) | ORAL | Status: AC | PRN

## 2011-08-18 MED ORDER — DROPERIDOL 2.5 MG/ML IJ SOLN
INTRAMUSCULAR | Status: AC
  Filled 2011-08-18: qty 2

## 2011-08-18 MED ORDER — ONDANSETRON HCL 4 MG/2ML IJ SOLN
INTRAMUSCULAR | Status: DC | PRN
  Administered 2011-08-18: 4 mg via INTRAVENOUS

## 2011-08-18 MED ORDER — FENTANYL CITRATE 0.05 MG/ML IJ SOLN
25.0000 ug | INTRAMUSCULAR | Status: DC | PRN
  Administered 2011-08-18: 50 ug via INTRAVENOUS

## 2011-08-18 MED ORDER — FENTANYL CITRATE 0.05 MG/ML IJ SOLN
INTRAMUSCULAR | Status: AC
  Administered 2011-08-18: 50 ug via INTRAVENOUS
  Filled 2011-08-18: qty 2

## 2011-08-18 MED ORDER — MIDAZOLAM HCL 5 MG/5ML IJ SOLN
INTRAMUSCULAR | Status: DC | PRN
  Administered 2011-08-18: 2 mg via INTRAVENOUS

## 2011-08-18 MED ORDER — PROMETHAZINE HCL 25 MG/ML IJ SOLN: 6.2500 mg | INTRAMUSCULAR | Status: DC | PRN

## 2011-08-18 MED ORDER — KETOROLAC TROMETHAMINE 30 MG/ML IJ SOLN: 15.0000 mg | Freq: Once | INTRAMUSCULAR | Status: DC | PRN

## 2011-08-18 MED ORDER — BUPIVACAINE HCL 0.5 % IJ SOLN
INTRAMUSCULAR | Status: DC | PRN
  Administered 2011-08-18: 30 mL

## 2011-08-18 MED ORDER — ONDANSETRON HCL 4 MG/2ML IJ SOLN
INTRAMUSCULAR | Status: AC
  Filled 2011-08-18: qty 2

## 2011-08-18 MED ORDER — BUPIVACAINE HCL (PF) 0.5 % IJ SOLN
INTRAMUSCULAR | Status: AC
  Filled 2011-08-18: qty 30

## 2011-08-18 MED ORDER — FENTANYL CITRATE 0.05 MG/ML IJ SOLN
INTRAMUSCULAR | Status: AC
  Filled 2011-08-18: qty 2

## 2011-08-18 MED ORDER — LACTATED RINGERS IR SOLN
Status: DC | PRN
  Administered 2011-08-18: 3000 mL

## 2011-08-18 MED ORDER — PROPOFOL 10 MG/ML IV EMUL
INTRAVENOUS | Status: AC
  Filled 2011-08-18: qty 20

## 2011-08-18 MED ORDER — MIDAZOLAM HCL 2 MG/2ML IJ SOLN
INTRAMUSCULAR | Status: AC
  Filled 2011-08-18: qty 2

## 2011-08-18 MED ORDER — DROPERIDOL 2.5 MG/ML IJ SOLN
INTRAMUSCULAR | Status: DC | PRN
  Administered 2011-08-18: 0.625 mg via INTRAVENOUS

## 2011-08-18 MED ORDER — PROPOFOL 10 MG/ML IV EMUL
INTRAVENOUS | Status: DC | PRN
  Administered 2011-08-18: 200 mg via INTRAVENOUS

## 2011-08-18 MED ORDER — DEXAMETHASONE SODIUM PHOSPHATE 4 MG/ML IJ SOLN
INTRAMUSCULAR | Status: DC | PRN
  Administered 2011-08-18: 10 mg via INTRAVENOUS

## 2011-08-18 MED ORDER — ACETAMINOPHEN 325 MG PO TABS: 325.0000 mg | ORAL_TABLET | ORAL | Status: DC | PRN

## 2011-08-18 MED ORDER — LACTATED RINGERS IV SOLN
INTRAVENOUS | Status: DC
  Administered 2011-08-18: 10:00:00 via INTRAVENOUS

## 2011-08-18 MED ORDER — DOCUSATE SODIUM 100 MG PO CAPS: 100.0000 mg | ORAL_CAPSULE | Freq: Two times a day (BID) | ORAL | Status: AC | PRN

## 2011-08-18 MED ORDER — FENTANYL CITRATE 0.05 MG/ML IJ SOLN
INTRAMUSCULAR | Status: DC | PRN
  Administered 2011-08-18: 100 ug via INTRAVENOUS

## 2011-08-18 MED ORDER — KETOROLAC TROMETHAMINE 30 MG/ML IJ SOLN
INTRAMUSCULAR | Status: DC | PRN
  Administered 2011-08-18: 30 mg via INTRAMUSCULAR
  Administered 2011-08-18: 30 mg via INTRAVENOUS

## 2011-08-18 MED ORDER — MIDAZOLAM HCL 2 MG/2ML IJ SOLN: 0.5000 mg | Freq: Once | INTRAMUSCULAR | Status: DC | PRN

## 2011-08-18 MED ORDER — LIDOCAINE HCL (CARDIAC) 20 MG/ML IV SOLN
INTRAVENOUS | Status: AC
  Filled 2011-08-18: qty 5

## 2011-08-18 SURGICAL SUPPLY — 16 items
ABLATOR ENDOMETRIAL BIPOLAR (ABLATOR) ×2 IMPLANT
CATH ROBINSON RED A/P 16FR (CATHETERS) ×2 IMPLANT
CLOTH BEACON ORANGE TIMEOUT ST (SAFETY) ×2 IMPLANT
CONTAINER PREFILL 10% NBF 60ML (FORM) IMPLANT
GLOVE BIO SURGEON STRL SZ7 (GLOVE) ×2 IMPLANT
GLOVE BIOGEL PI IND STRL 6.5 (GLOVE) ×1 IMPLANT
GLOVE BIOGEL PI IND STRL 7.0 (GLOVE) ×2 IMPLANT
GLOVE BIOGEL PI INDICATOR 6.5 (GLOVE) ×1
GLOVE BIOGEL PI INDICATOR 7.0 (GLOVE) ×2
GLOVE ECLIPSE 6.0 STRL STRAW (GLOVE) ×2 IMPLANT
GOWN PREVENTION PLUS LG XLONG (DISPOSABLE) ×4 IMPLANT
GOWN STRL REIN XL XLG (GOWN DISPOSABLE) ×2 IMPLANT
PACK HYSTEROSCOPY LF (CUSTOM PROCEDURE TRAY) ×2 IMPLANT
PAD OB MATERNITY 4.3X12.25 (PERSONAL CARE ITEMS) ×2 IMPLANT
TOWEL OR 17X24 6PK STRL BLUE (TOWEL DISPOSABLE) ×4 IMPLANT
WATER STERILE IRR 1000ML POUR (IV SOLUTION) ×2 IMPLANT

## 2011-08-18 NOTE — Interval H&P Note (Signed)
History and Physical Interval Note  Amanda Dickerson  has presented today for surgery, with the diagnosis of Menorrhagia  The various methods of treatment have been discussed with the patient and family. After consideration of risks, benefits and other options for treatment, the patient has consented to  Procedure(s) (LRB): DILATATION & CURETTAGE/HYSTEROSCOPY WITH NOVASURE ABLATION (N/A) as a surgical intervention .  The patients' history has been reviewed, patient examined, no change in status, stable for surgery.  I have reviewed the patients' chart and labs.  Questions were answered to the patient's satisfaction.  To OR when ready  Verita Schneiders, M.D. 08/18/2011 10:53 AM

## 2011-08-18 NOTE — Discharge Instructions (Signed)
Hysteroscopy Hysteroscopy is a procedure used for looking inside the womb (uterus). It may be done for many different reasons, including:  To evaluate abnormal bleeding, fibroid (benign, noncancerous) tumors, polyps, scar tissue (adhesions), and possibly cancer of the uterus.  LET YOUR CAREGIVER KNOW ABOUT:   Allergies.   Medicines taken, including herbs, eyedrops, over-the-counter medicines, and creams.   Use of steroids (by mouth or creams).   Previous problems with anesthetics or numbing medicines.   History of bleeding or blood problems.   History of blood clots.   Possibility of pregnancy, if this applies.   Previous surgery.   Other health problems.  RISKS AND COMPLICATIONS   Putting a hole in the uterus.   Excessive bleeding.   Infection.   Damage to the cervix.   Injury to other organs.   Allergic reaction to medicines.   Too much fluid used in the uterus for the procedure.  BEFORE THE PROCEDURE   Do not take aspirin or blood thinners for a week before the procedure, or as directed. It can cause bleeding.   Arrive at least 60 minutes before the procedure or as directed to read and sign the necessary forms.   Arrange for someone to take you home after the procedure.   If you smoke, do not smoke for 2 weeks before the procedure.  PROCEDURE   Your caregiver may give you medicine to relax you. He or she may also give you a medicine that numbs the area around the cervix (local anesthetic) or a medicine that makes you sleep (general anesthesia).   Sometimes, a medicine is placed in the cervix the day before the procedure. This medicine makes the cervix have a larger opening (dilate). This makes it easier for the instrument to be inserted into the uterus.   A small instrument (hysteroscope) is inserted through the vagina into the uterus. This instrument is similar to a pencil-sized telescope with a light.   During the procedure, air or a liquid is put into the  uterus, which allows the surgeon to see better.   Sometimes, tissue is gently scraped from inside the uterus. These tissue samples are sent to a specialist who looks at tissue samples (pathologist). The pathologist will give a report to your caregiver. This will help your caregiver decide if further treatment is necessary. The report will also help your caregiver decide on the best treatment if the test comes back abnormal.  AFTER THE PROCEDURE   If you had a general anesthetic, you may be groggy for a couple hours after the procedure.   If you had a local anesthetic, you will be advised to rest at the surgical center or caregiver's office until you are stable and feel ready to go home.   You may have some cramping for a couple days.   You may have bleeding, which varies from light spotting for a few days to menstrual-like bleeding for up to 3 to 7 days. This is normal.   Have someone take you home.  FINDING OUT THE RESULTS OF YOUR TEST Not all test results are available during your visit. If your test results are not back during the visit, make an appointment with your caregiver to find out the results. Do not assume everything is normal if you have not heard from your caregiver or the medical facility. It is important for you to follow up on all of your test results. HOME CARE INSTRUCTIONS   Do not drive for 24 hours or  as instructed.   Only take over-the-counter or prescription medicines for pain, discomfort, or fever as directed by your caregiver.   Do not take aspirin. It can cause or aggravate bleeding.   Do not drive or drink alcohol while taking pain medicine.   You may resume your usual diet.   Do not use tampons, douche, or have sexual intercourse for 2 weeks, or as advised by your caregiver.   Rest and sleep for the first 24 to 48 hours.   Take your temperature twice a day for 4 to 5 days. Write it down. Give these temperatures to your caregiver if they are abnormal (above  98.6 F or 37.0 C).   Take medicines your caregiver has ordered as directed.   Follow your caregiver's advice regarding diet, exercise, lifting, driving, and general activities.   Take showers instead of baths for 2 weeks, or as recommended by your caregiver.   If you develop constipation:   Take a mild laxative with the advice of your caregiver.   Eat bran foods.   Drink enough water and fluids to keep your urine clear or pale yellow.   Try to have someone with you or available to you for the first 24 to 48 hours, especially if you had a general anesthetic.   Make sure you and your family understand everything about your operation and recovery.   Follow your caregiver's advice regarding follow-up appointments and Pap smears.  SEEK MEDICAL CARE IF:   You feel dizzy or lightheaded.   You feel sick to your stomach (nauseous).   You develop abnormal vaginal discharge.   You develop a rash.   You have an abnormal reaction or allergy to your medicine.   You need stronger pain medicine.  SEEK IMMEDIATE MEDICAL CARE IF:   Bleeding is heavier than a normal menstrual period or you have blood clots.   You have an oral temperature above 102 F (38.9 C), not controlled by medicine.   You have increasing cramps or pains not relieved with medicine.   You develop belly (abdominal) pain that does not seem to be related to the same area of earlier cramping and pain.   You pass out.   You develop pain in the tops of your shoulders (shoulder strap areas).   You develop shortness of breath.  MAKE SURE YOU:   Understand these instructions.   Will watch your condition.   Will get help right away if you are not doing well or get worse.  Document Released: 05/26/2000 Document Revised: 02/06/2011 Document Reviewed: 09/18/2008 [facility] Patient Information 2012 [place].  Endometrial Ablation Endometrial ablation removes the lining of the uterus (endometrium). It is usually  a same day, outpatient treatment. Ablation helps avoid major surgery (such as a hysterectomy). A hysterectomy is removal of the cervix and uterus. Endometrial ablation has less risk and complications, has a shorter recovery period and is less expensive. During the ablation, the lining of the uterus is destroyed. Usually, you will be able to go home within an hour after the procedure. After endometrial ablation, most women will have little or no menstrual bleeding. You may not keep your fertility. Pregnancy is no longer likely after this procedure but if you are pre-menopausal, you still need to use a reliable method of birth control following the procedure because pregnancy can occur. RISKS AND COMPLICATIONS   Perforation of the uterus.   Bleeding.   Infection of the uterus, bladder or vagina.   Injury to surrounding  organs.   Cutting the cervix.   An air bubble to the lung (air embolus).   Pregnancy following the procedure.   Failure of the procedure to help the problem requiring hysterectomy.   Decreased ability to diagnose cancer in the lining of the uterus.  HOME CARE INSTRUCTIONS   Do not drive for 24 hours.   No tampons, douching or intercourse for 2 weeks or until your caregiver approves.   Rest at home for 24 to 48 hours. You may then resume normal activities unless told differently by your caregiver.   Take your temperature two times a day for 4 days, and record it.   Take any medications your caregiver has ordered, as directed.   Use some form of contraception if you are pre-menopausal and do not want to get pregnant.  Bleeding after the procedure is normal. It varies from light spotting and mildly watery to bloody discharge for 4 to 6 weeks. You may also have mild cramping. Only take over-the-counter or prescription medicines for pain, discomfort, or fever as directed by your caregiver. Do not use aspirin, as this may aggravate bleeding. Frequent urination during the first  24 hours is normal. You will not know how effective your surgery is until at least 3 months after the surgery. SEEK IMMEDIATE MEDICAL CARE IF:   Bleeding is heavier than a normal menstrual cycle.   An oral temperature above 102 F (38.9 C) develops.   You have increasing cramps or pains not relieved with medication or develop belly (abdominal) pain which does not seem to be related to the same area of earlier cramping and pain.   You are light headed, weak or have fainting episodes.   You develop pain in the shoulder strap areas.   You have chest or leg pain.   You have abnormal vaginal discharge.   You have painful urination.  Document Released: 12/28/2003 Document Revised: 02/06/2011 Document Reviewed: 03/27/2007 [facility] Patient Information 2012 [place].

## 2011-08-18 NOTE — H&P (View-Only) (Signed)
History:  51 y.o. G1P1001 here today for preoperative examination for and discussion about endometrial ablation for menorrhagia. She does not currently have any bleeding.  No abdominal pain, abnormal discharge or other concerns.  The following portions of the patient's history were reviewed and updated as appropriate: allergies, current medications, past family history, past medical history, past social history, past surgical history and problem list.  Review of Systems:  Pertinent items are noted in HPI.  Objective:  Physical Exam Blood pressure 134/85, pulse 64, temperature 97.8 F (36.6 C), temperature source Oral, resp. rate 12, height 5' 5"  (1.651 m), weight 198 lb 4.8 oz (89.948 kg), last menstrual period 04/28/2011. Gen: NAD Abd: Soft, nontender and nondistended Pelvic: Normal appearing external genitalia; normal appearing vaginal mucosa and cervix.  Normal discharge.  Small anteverted uterus, no other palpable masses, no uterine or adnexal tenderness  Labs and Imaging 06/04/11 Endometrium, biopsy BENIGN PROLIFERATIVE ENDOMETRIUM. NO ATYPIA, HYPERPLASIA OR MALIGNANCY.  04/28/11 TRANSABDOMINAL AND TRANSVAGINAL ULTRASOUND OF PELVIS  Findings: Uterus: 9.8 x 4.7 x 5.4 cm. The uterine myometrium has a heterogeneous echotexture, particularly in the posterior corpus. Ill-defined increased echogenicity and tiny cystic foci are seen in the posterior uterine myometrium, suspicious for adenomyosis. No discrete fibroids identified. Endometrium: Double layer endometrial thickness measures 16 mm. No focal lesion visualized.  Right ovary: not directly visualized by transabdominal or  transvaginal sonography, however no adnexal mass identified. Left ovary: not directly visualized by transabdominal or transvaginal sonography, however no adnexal mass identified. Other Findings: No free fluid  IMPRESSION: 1. Endometrial thickness measures 16 mm. If bleeding is unresponsive to hormonal or medical therapy,  endometrial sampling should be considered in patients at high risk for endometrial carcinoma, or sonohysterogram could be performed for focal lesion work-up. 2. Heterogeneous increased echogenicity and tiny cystic foci in the posterior uterine myometrium, suspicious for adenomyosis. No definite fibroids identified. Pelvic MRI could be performed for improved characterization if clinically warranted. 3. Nonvisualization of ovaries, however no adnexal mass identified.   Assessment & Plan:  Patient desires surgical management with endometrial ablation. Discussed different modalities of endometrial ablation, differences highlighted. Patient desires Novasure endometrial ablation.  Risks of surgery were discussed with the patient including but not limited to: bleeding which may require transfusion; infection which may require antibiotics; injury to uterus leading to risk of injury to surrounding intraperitoneal organs, need for additional procedures including laparoscopy or laparotomy, and other postoperative/anesthesia complications.  Patient was told that the likelihood that her condition and symptoms will be treated effectively with this surgical management was very high; the postoperative expectations were also discussed in detail. The patient also understands the alternative treatment options which were discussed in full. All questions were answered.  She was told that she will be contacted by our surgical scheduler regarding the time and date of her surgery; routine preoperative instructions of having nothing to eat or drink after midnight on the day prior to surgery and also coming to the hospital 1 1/2 hours prior to her time of surgery were also emphasized.  She was told she may be called for a preoperative appointment about a week prior to surgery and will be given further preoperative instructions at that visit. Printed patient education handout about the Novasure procedure were given to the patient to review  at home.

## 2011-08-18 NOTE — Anesthesia Preprocedure Evaluation (Signed)
Anesthesia Evaluation  Patient identified by MRN, date of birth, ID band Patient awake    Reviewed: Allergy & Precautions, H&P , Patient's Chart, lab work & pertinent test results, reviewed documented beta blocker date and time   History of Anesthesia Complications Negative for: history of anesthetic complications  Airway Mallampati: II TM Distance: >3 FB Neck ROM: full    Dental No notable dental hx.    Pulmonary neg pulmonary ROS, asthma ,  breath sounds clear to auscultation  Pulmonary exam normal       Cardiovascular Exercise Tolerance: Good negative cardio ROS  Rhythm:regular Rate:Normal     Neuro/Psych  Headaches, negative neurological ROS  negative psych ROS   GI/Hepatic negative GI ROS, Neg liver ROS,   Endo/Other  negative endocrine ROS  Renal/GU negative Renal ROS     Musculoskeletal   Abdominal   Peds  Hematology negative hematology ROS (+)   Anesthesia Other Findings Asthma     Headache        Fibromyalgia     Dysfunctional uterine bleeding           Reproductive/Obstetrics negative OB ROS                           Anesthesia Physical Anesthesia Plan  ASA: II  Anesthesia Plan: General LMA   Post-op Pain Management:    Induction:   Airway Management Planned:   Additional Equipment:   Intra-op Plan:   Post-operative Plan:   Informed Consent: I have reviewed the patients History and Physical, chart, labs and discussed the procedure including the risks, benefits and alternatives for the proposed anesthesia with the patient or authorized representative who has indicated his/her understanding and acceptance.   Dental Advisory Given  Plan Discussed with: CRNA, Surgeon and Anesthesiologist  Anesthesia Plan Comments:         Anesthesia Quick Evaluation

## 2011-08-18 NOTE — Op Note (Signed)
PREOPERATIVE DIAGNOSIS:  Menorrhagia POSTOPERATIVE DIAGNOSIS: The same PROCEDURE:  Hysteroscopy, Dilation and curettage, NovaSure Endometrial Ablation SURGEON:  Dr. Verita Schneiders  INDICATIONS: 51 y.o. G1P1001 here for NovaSure Endometrial Ablation.  Risks of surgery were discussed with the patient including but not limited to: bleeding which may require transfusion; infection which may require antibiotics; injury to uterus leading to risk of injury to surrounding intraperitoneal organs, need for additional procedures including laparoscopy or laparotomy, and other postoperative/anesthesia complications. Written informed consent was obtained.   FINDINGS:  A 8 week size uterus.  Diffuse proliferative endometrium. Normal ostia bilaterally.  ANESTHESIA:   General, paracervical block. INTRAVENOUS FLUIDS:  1000 ml of LR FLUID DEFICITS:  90 ml of Lactated Ringers ESTIMATED BLOOD LOSS:  5 ml. SPECIMENS: None COMPLICATIONS:  None immediate.  PROCEDURE DETAILS:  The patient was taken to the operating room where general anesthesia was administered and was found to be adequate.  After an adequate timeout was performed, she was placed in the dorsal lithotomy position and examined; then prepped and draped in the sterile manner.   Her bladder was catheterized for an unmeasured amount of clear, yellow urine. A speculum was then placed in the patient's vagina and a single tooth tenaculum was applied to the anterior lip of the cervix.  A paracervical block using 30 ml of 0.5% Marcaine was administered.  The uterine cavity was sounded to 8 cm, and the sound was used to obtain the cervical and uterine cavity length measurements of 3.5 cm and 4.5 cm respectively.  The cervix was dilated manually with Hegar dilators to accommodate the 5 mm hysteroscope.  Once the cervix was dilated, the hysteroscope was inserted under direct visualization.  The hysteroscope was also used to determine the level of the internal os, and  measurements were confirmed. The uterine cavity was carefully examined, both ostia were recognized, and diffusely proliferative endometrium was noted. The hysteroscope was removed and the cervix was further dilated to accommodate the NovaSure device.  The NovaSure device was inserted, and a cavity width of 3.6 cm was determined. Using a power of 89 watts, for 65 sec, the endometrial ablation was performed. The hysteroscope was then re-introduced into the uterine cavity, confirming complete ablation of the endometrium. The tenaculum was removed from the anterior lip of the cervix, and the vaginal speculum was removed after noting good hemostasis.  The patient tolerated the procedure well and was taken to the recovery area awake, extubated and in stable condition.  The patient will be discharged to home as per PACU criteria.  Routine postoperative instructions given.  She was prescribed Percocet, Ibuprofen and Colace.  She will follow up in the clinic on 09/11/11  for postoperative evaluation .

## 2011-08-18 NOTE — Anesthesia Postprocedure Evaluation (Signed)
Anesthesia Post Note  Patient: Amanda Dickerson  Procedure(s) Performed: Procedure(s) (LRB): DILATATION & CURETTAGE/HYSTEROSCOPY WITH NOVASURE ABLATION (N/A)  Anesthesia type: General  Patient location: PACU  Post pain: Pain level controlled  Post assessment: Post-op Vital signs reviewed  Last Vitals:  Filed Vitals:   08/18/11 1245  BP: 131/78  Pulse: 57  Temp:   Resp: 16    Post vital signs: Reviewed  Level of consciousness: sedated  Complications: No apparent anesthesia complications

## 2011-08-18 NOTE — Transfer of Care (Signed)
Immediate Anesthesia Transfer of Care Note  Patient: Amanda Dickerson  Procedure(s) Performed: Procedure(s) (LRB): DILATATION & CURETTAGE/HYSTEROSCOPY WITH NOVASURE ABLATION (N/A)  Patient Location: PACU  Anesthesia Type: General  Level of Consciousness: awake and sedated  Airway & Oxygen Therapy: Patient Spontanous Breathing and Patient connected to nasal cannula oxygen  Post-op Assessment: Report given to PACU RN and Post -op Vital signs reviewed and stable  Post vital signs: Reviewed and stable  Complications: No apparent anesthesia complications

## 2011-08-19 ENCOUNTER — Ambulatory Visit: Payer: Self-pay | Admitting: Emergency Medicine

## 2011-08-28 ENCOUNTER — Encounter: Payer: Self-pay | Admitting: Emergency Medicine

## 2011-08-28 ENCOUNTER — Ambulatory Visit (INDEPENDENT_AMBULATORY_CARE_PROVIDER_SITE_OTHER): Payer: Self-pay | Admitting: Emergency Medicine

## 2011-08-28 VITALS — BP 133/86 | HR 71 | Ht 64.5 in | Wt 196.0 lb

## 2011-08-28 DIAGNOSIS — IMO0001 Reserved for inherently not codable concepts without codable children: Secondary | ICD-10-CM

## 2011-08-28 DIAGNOSIS — M797 Fibromyalgia: Secondary | ICD-10-CM

## 2011-08-28 DIAGNOSIS — R42 Dizziness and giddiness: Secondary | ICD-10-CM

## 2011-08-28 DIAGNOSIS — Z0000 Encounter for general adult medical examination without abnormal findings: Secondary | ICD-10-CM

## 2011-08-28 MED ORDER — OMEPRAZOLE 20 MG PO CPDR: 20.0000 mg | DELAYED_RELEASE_CAPSULE | Freq: Every day | ORAL | Status: DC

## 2011-08-28 MED ORDER — MECLIZINE HCL 25 MG PO TABS: 25.0000 mg | ORAL_TABLET | Freq: Three times a day (TID) | ORAL | Status: AC | PRN

## 2011-08-28 MED ORDER — VENLAFAXINE HCL ER 37.5 MG PO CP24: 37.5000 mg | ORAL_CAPSULE | Freq: Every day | ORAL | Status: DC

## 2011-08-28 NOTE — Patient Instructions (Signed)
It was nice to see you again.  If Amanda Dickerson says the orange card you have will cover labs, please make a lab appointment to get your blood drawn - do not eat or drink anything 8 hours prior to the lab draw.  I will see you in about 1 month to see how the meclizine and Effexor are working.

## 2011-08-28 NOTE — Assessment & Plan Note (Signed)
Neurologic exam intact.  Provided prescription and application to receive Effexor XR at reduced cost.  Recommended getting as much exercise as possible.

## 2011-08-28 NOTE — Assessment & Plan Note (Signed)
Vertigo vs migraine related.  Will try meclizine 39m TID prn.  Follow up in 1 month.

## 2011-08-28 NOTE — Progress Notes (Signed)
  Subjective:    Patient ID: Graceann Congress, female    DOB: 04/10/1960, 51 y.o.   MRN: [id number]  HPI SAYURI RHAMES is here for follow up.  1. Dizziness: Continues to have intermittent dizziness alternately described as lightheaded and vertigo.  No associated nausea.  Seems to be triggered by headaches or moving her head too quickly.  Episodes resolve spontaneously or with lying down.  No associated nausea.  Mom does have vertigo.  No ear symptoms other than a draining sensation.  2. Fibromyalgia: Was not able to get the Effexor prescription.  Pain primarily in left shoulder and left hip/thigh.  Described as a deep burning like a toothache, that comes and goes.  Massaging leg does not help.  Feels generally weak.    I have reviewed and updated the following as appropriate: allergies, current medications, past surgical history and problem list  Review of Systems See HPI    Objective:   Physical Exam BP 133/86  Pulse 71  Ht 5' 4.5" (1.638 m)  Wt 196 lb (88.905 kg)  BMI 33.12 kg/m2  LMP 04/28/2011 Gen: alert, cooperative, NAD HEENT: AT/[place], TMs normal bilaterally, no pharyngeal erythema or exudate, PERRL CV: RRR, no murmurs Pulm: CTAB, no wheezes or rales Neuro: Dix-hallpike negative for nystagmus but does cause slight dizzy sensation bilaterally, strength 5/5 in all extremities, gross sensation intact, CN II-XII intact, negative SLR bilaterally, negative spurlings bilaterally     Assessment & Plan:

## 2011-08-28 NOTE — Assessment & Plan Note (Signed)
Provided handout to schedule mammogram.  Will make lab appointment for labs if orange card (was seeing [facility] with an orange card) will cover it.

## 2011-09-11 ENCOUNTER — Ambulatory Visit (INDEPENDENT_AMBULATORY_CARE_PROVIDER_SITE_OTHER): Payer: Self-pay | Admitting: Obstetrics & Gynecology

## 2011-09-11 ENCOUNTER — Encounter: Payer: Self-pay | Admitting: Obstetrics & Gynecology

## 2011-09-11 VITALS — BP 128/83 | HR 65 | Temp 98.3°F | Ht 64.0 in | Wt 197.5 lb

## 2011-09-11 DIAGNOSIS — Z09 Encounter for follow-up examination after completed treatment for conditions other than malignant neoplasm: Secondary | ICD-10-CM

## 2011-09-11 DIAGNOSIS — A499 Bacterial infection, unspecified: Secondary | ICD-10-CM

## 2011-09-11 DIAGNOSIS — N760 Acute vaginitis: Secondary | ICD-10-CM

## 2011-09-11 DIAGNOSIS — B9689 Other specified bacterial agents as the cause of diseases classified elsewhere: Secondary | ICD-10-CM

## 2011-09-11 NOTE — Progress Notes (Signed)
  Subjective:     Amanda Dickerson is a 51 y.o. female who presents to the clinic 3 weeks status post diagnostic hysteroscopy, D&C, Novasure endometrial ablation on 08/18/11 for abnormal uterine bleeding. Eating a regular diet without difficulty. Bowel movements are normal. The patient is not having any pain. complains of pruritic foul-smelling discharge, wants to make sure she doesnot have BV on top of routine post-ablation discharge.  The following portions of the patient's history were reviewed and updated as appropriate: allergies, current medications, past family history, past medical history, past social history, past surgical history and problem list.  Review of Systems Pertinent items are noted in HPI.    Objective:    BP 128/83  Pulse 65  Temp 98.3 F (36.8 C) (Oral)  Ht 5' 4"  (1.626 m)  Wt 197 lb 8 oz (89.585 kg)  BMI 33.90 kg/m2  LMP 04/28/2011 General:  alert and no distress  Abdomen: soft, bowel sounds active, non-tender  Pelvic:   Brown, malodorous discharge seen. Wet prep obtained     08/18/11 Endometrium, curettage - BENIGN WEAKLY PROLIFERATIVE ENDOMETRIUM, NO ATYPIA, HYPERPLASIA OR MALIGNANCY. - BENIGN SQUAMOUS EPITHELIUM, NO DYSPLASIA OR MALIGNANCY.  Assessment:    Doing well postoperatively. Operative findings again reviewed. Pathology report discussed.    Plan:   Will follow up wet prep and manage accordingly. Pelvic rest restrictions lifted

## 2011-09-11 NOTE — Patient Instructions (Signed)
Return to clinic for any scheduled appointments or for any gynecologic concerns as needed.   

## 2011-09-12 LAB — WET PREP, GENITAL
Trich, Wet Prep: NONE SEEN
Yeast Wet Prep HPF POC: NONE SEEN

## 2011-09-16 MED ORDER — METRONIDAZOLE 500 MG PO TABS: 500.0000 mg | ORAL_TABLET | Freq: Two times a day (BID) | ORAL | Status: AC

## 2011-09-16 NOTE — Addendum Note (Signed)
Addended by: Verita Schneiders A on: 09/16/2011 01:52 PM   Modules accepted: Orders

## 2011-09-16 NOTE — Progress Notes (Addendum)
Wet prep showed clue cells, Metronidazole phoned in to [facility]. Patient called  to inform her of diagnosis and treatment, and to go pick up the prescription. She was counseled about antabuse reaction that can happen with alcohol and metronidazole. She was also told to call the clinic with any further questions or further concerns.

## 2011-09-26 ENCOUNTER — Encounter: Payer: Self-pay | Admitting: Emergency Medicine

## 2011-09-26 ENCOUNTER — Ambulatory Visit (INDEPENDENT_AMBULATORY_CARE_PROVIDER_SITE_OTHER): Payer: Self-pay | Admitting: Emergency Medicine

## 2011-09-26 VITALS — BP 139/94 | HR 80 | Ht 64.0 in | Wt 196.0 lb

## 2011-09-26 DIAGNOSIS — R232 Flushing: Secondary | ICD-10-CM | POA: Insufficient documentation

## 2011-09-26 DIAGNOSIS — N951 Menopausal and female climacteric states: Secondary | ICD-10-CM

## 2011-09-26 DIAGNOSIS — M797 Fibromyalgia: Secondary | ICD-10-CM

## 2011-09-26 DIAGNOSIS — IMO0001 Reserved for inherently not codable concepts without codable children: Secondary | ICD-10-CM

## 2011-09-26 MED ORDER — GABAPENTIN 100 MG PO CAPS: 100.0000 mg | ORAL_CAPSULE | Freq: Three times a day (TID) | ORAL | Status: DC

## 2011-09-26 NOTE — Assessment & Plan Note (Signed)
Poorly controlled.  Concern for developing left frozen shoulder.  Gave home stretching exercises for shoulder and hamstrings.  Will refer to PT when patient has medicaid vs orange card.  Start gabapentin 127m in morning and afternoon, 3064mat bedtime.  Continue flexeril and ibuprofen.  Encouraged daily exercise.

## 2011-09-26 NOTE — Patient Instructions (Addendum)
It was nice to see you, I'm sorry your having a hard time.  I have given you some handouts for shoulder and hamstring stretches.  Please do these at home until we can send you to physical therapy.  I have also given you a prescription for gabapentin.  Take 1 tablet in the morning and afternoon and 3 at bedtime.  This will help with your pain and it also helps with hot flashes.   Follow up in 2-4 weeks.

## 2011-09-26 NOTE — Progress Notes (Signed)
  Subjective:    Patient ID: Amanda Dickerson, female    DOB: April 03, 1960, 51 y.o.   MRN: [id number]  HPI Amanda Dickerson is here for f/u of fibromyalgia.  Fibromyalgia: reports worse pain on left side.  Primarily in left shoulder and shooting down arm like "arrows."  Worse with movement (particularly overhead), better with rest.  No left sided neck pain.  Does have some numbness and tingling in left hand.  Similar pain is also in left buttock and leg.  Unable to get the Effexor  Also complaining of hot flashes, worse since her ablation.  Does have pending Medicaid application.  I have reviewed and updated the following as appropriate: allergies and current medications  Review of Systems See HPI    Objective:   Physical Exam BP 139/94  Pulse 80  Ht 5' 4"  (1.626 m)  Wt 196 lb (88.905 kg)  BMI 33.64 kg/m2  LMP 04/28/2011 Gen: alert, cooperative, mildly tearful HEENT: AT/, sclera white, MMM CV: RRR, no murmurs Pulm: CTAB, no wheezes or rales MSK -left shoulder: no erythema or edema, no point tenderness, limited range of motion (no overhead movement) -left leg: no erythema or edema, no point tenderness, hamstrings extremely tight Neuro: sensation intact in all 4 extremities, mild diffuse weakness but poor effort with exam     Assessment & Plan:

## 2011-09-26 NOTE — Assessment & Plan Note (Signed)
Likely peri-menopausal; however, difficult to assess given history of DUB and recent ablation.  Some evidence for gabapentin helping, will continue to monitor.

## 2011-09-30 ENCOUNTER — Telehealth: Payer: Self-pay | Admitting: Emergency Medicine

## 2011-09-30 NOTE — Telephone Encounter (Signed)
Patient should take 153m in the morning and afternoon and 3034mat bedtime.

## 2011-09-30 NOTE — Telephone Encounter (Signed)
Called [facility] and was told they can get  Neurontin  as long as they have an RX. The current RX given 07/26  has 2 directions . Patient has this RX in her possession she states . Will need for Dr. Darrick Grinder to clarify directions then I will call RX to [facility].

## 2011-09-30 NOTE — Telephone Encounter (Signed)
Pt states that the gabapentin is too expensive and would like [place] to send to [place] - fax# [phone]

## 2011-10-01 NOTE — Telephone Encounter (Signed)
[place] and gave correct directions. Gave enough for one month # 150  tabs

## 2011-10-08 ENCOUNTER — Other Ambulatory Visit: Payer: Self-pay | Admitting: Emergency Medicine

## 2011-10-08 NOTE — Telephone Encounter (Signed)
Patient  wants RX for neurontin   ( brand name ) faxed to [facility]. Fax number below. Will ask MD to print and sign then give back to [facility] and I will fax. [facility] advised patient she is not signed up with them.

## 2011-10-08 NOTE — Telephone Encounter (Signed)
Patient is calling because she needs the Rx to be for the brand name Neurontin through [facility].  She is sorry for all of the confusion.

## 2011-10-08 NOTE — Telephone Encounter (Signed)
Message left to return call.

## 2011-10-09 ENCOUNTER — Other Ambulatory Visit: Payer: Self-pay | Admitting: Emergency Medicine

## 2011-10-09 MED ORDER — GABAPENTIN 100 MG PO CAPS: 100.0000 mg | ORAL_CAPSULE | Freq: Three times a day (TID) | ORAL | Status: DC

## 2011-10-09 NOTE — Telephone Encounter (Signed)
Faxed to [facility] at # provided by patient in previous note.

## 2011-10-09 NOTE — Telephone Encounter (Signed)
Faxed to [facility] at number provided.

## 2011-10-09 NOTE — Telephone Encounter (Signed)
Printed prescription and given to [facility].

## 2011-11-06 ENCOUNTER — Ambulatory Visit (INDEPENDENT_AMBULATORY_CARE_PROVIDER_SITE_OTHER): Payer: Self-pay | Admitting: Emergency Medicine

## 2011-11-06 ENCOUNTER — Encounter: Payer: Self-pay | Admitting: Emergency Medicine

## 2011-11-06 VITALS — BP 125/82 | HR 62 | Ht 64.0 in | Wt 198.0 lb

## 2011-11-06 DIAGNOSIS — M797 Fibromyalgia: Secondary | ICD-10-CM

## 2011-11-06 DIAGNOSIS — IMO0001 Reserved for inherently not codable concepts without codable children: Secondary | ICD-10-CM

## 2011-11-06 MED ORDER — GABAPENTIN 100 MG PO CAPS: 100.0000 mg | ORAL_CAPSULE | Freq: Three times a day (TID) | ORAL | Status: DC

## 2011-11-06 NOTE — Progress Notes (Signed)
  Subjective:    Patient ID: Amanda Dickerson, female    DOB: 10-28-1960, 51 y.o.   MRN: [id number]  HPI Amanda Dickerson is here for follow up of fibromyalgia.  Reports taking gabapentin as prescribed.  States sometimes she feels like it "takes the edge off," but does not get rid of the pain.  Currently worst in her legs.  No problems taking the gabapentin.  She has been experience some headaches and anxiety, but is going through a stressful divorce currently.  Also has a spot on the back of her neck that has been there for a while, but now is causing pain with combing her hair.  I have reviewed and updated the following as appropriate: allergies and current medications   Review of Systems See HPI    Objective:   Physical Exam BP 125/82  Pulse 62  Ht 5' 4"  (1.626 m)  Wt 198 lb (89.812 kg)  BMI 33.99 kg/m2  LMP 04/24/2011 Gen: alert, cooperative, NAD HEENT: AT/[place], sclera white, MMM CV: RRR, no murmurs Pulm: CTAB MSK: no point tenderness      Assessment & Plan:  Skin tags: present on right posterior neck; discussed options for removal with patient and she would like to freeze them off.  Will proceed with cryotherapy at follow up appointment.

## 2011-11-06 NOTE — Assessment & Plan Note (Signed)
Gabapentin may be helping some.  Will titrate to 365m in morning, 3081min afternoon, and 60069mt bedtime.  If anxiety and headaches get worse, will likely attribute to gabapentin and titrate medication off and try Cymbalta instead.  Follow up in 4 weeks.

## 2011-11-06 NOTE — Patient Instructions (Addendum)
It was good to see you!  We are going to titrate up your gabapentin. Starting today, take 2 tablets in the morning, 2 in the afternoon, and 3 at bedtime. After 3 days, take 3 tablets in the morning, afternoon and bedtime.  After 3 days take 3 tablets in the morning, 3 in the afternoon and 6 at bedtime.  The things on your neck are skin tags.  We can freeze them at your follow up appointment.  Follow up with me in 2-4 weeks.  Please call me in 2 weeks to let me know if the gabapentin is working or if your headaches and anxiety are getting worse with the increased dose.

## 2011-11-14 ENCOUNTER — Other Ambulatory Visit: Payer: Self-pay | Admitting: Obstetrics & Gynecology

## 2011-11-14 DIAGNOSIS — Z1231 Encounter for screening mammogram for malignant neoplasm of breast: Secondary | ICD-10-CM

## 2011-11-17 ENCOUNTER — Ambulatory Visit (HOSPITAL_COMMUNITY)
Admission: RE | Admit: 2011-11-17 | Discharge: 2011-11-17 | Disposition: A | Discharge status: Home / Self Care | Payer: No Typology Code available for payment source | Source: Ambulatory Visit | Attending: Obstetrics & Gynecology | Admitting: Obstetrics & Gynecology

## 2011-11-17 DIAGNOSIS — Z1231 Encounter for screening mammogram for malignant neoplasm of breast: Secondary | ICD-10-CM

## 2011-11-27 ENCOUNTER — Encounter: Payer: Self-pay | Admitting: Emergency Medicine

## 2011-11-27 ENCOUNTER — Ambulatory Visit (INDEPENDENT_AMBULATORY_CARE_PROVIDER_SITE_OTHER): Payer: Self-pay | Admitting: Emergency Medicine

## 2011-11-27 VITALS — BP 125/85 | HR 60 | Ht 64.0 in | Wt 198.0 lb

## 2011-11-27 DIAGNOSIS — J4520 Mild intermittent asthma, uncomplicated: Secondary | ICD-10-CM | POA: Insufficient documentation

## 2011-11-27 DIAGNOSIS — IMO0001 Reserved for inherently not codable concepts without codable children: Secondary | ICD-10-CM

## 2011-11-27 DIAGNOSIS — M797 Fibromyalgia: Secondary | ICD-10-CM

## 2011-11-27 DIAGNOSIS — Z0000 Encounter for general adult medical examination without abnormal findings: Secondary | ICD-10-CM

## 2011-11-27 DIAGNOSIS — J45909 Unspecified asthma, uncomplicated: Secondary | ICD-10-CM

## 2011-11-27 LAB — COMPREHENSIVE METABOLIC PANEL
ALT: 18 U/L (ref 0–35)
AST: 16 U/L (ref 0–37)
Albumin: 4.2 g/dL (ref 3.5–5.2)
Alkaline Phosphatase: 68 U/L (ref 39–117)
BUN: 11 mg/dL (ref 6–23)
Calcium: 9 mg/dL (ref 8.4–10.5)
Chloride: 105 mEq/L (ref 96–112)
Potassium: 4.1 mEq/L (ref 3.5–5.3)
Sodium: 139 mEq/L (ref 135–145)
Total Protein: 7 g/dL (ref 6.0–8.3)

## 2011-11-27 LAB — LIPID PANEL
Cholesterol: 204 mg/dL — ABNORMAL HIGH (ref 0–200)
Total CHOL/HDL Ratio: 3.3 Ratio
VLDL: 31 mg/dL (ref 0–40)

## 2011-11-27 MED ORDER — ALBUTEROL SULFATE HFA 108 (90 BASE) MCG/ACT IN AERS: 2.0000 | INHALATION_SPRAY | Freq: Four times a day (QID) | RESPIRATORY_TRACT | Status: DC | PRN

## 2011-11-27 MED ORDER — NORTRIPTYLINE HCL 25 MG PO CAPS: 25.0000 mg | ORAL_CAPSULE | Freq: Every day | ORAL | Status: DC

## 2011-11-27 NOTE — Assessment & Plan Note (Signed)
By patient report.  While no wheezes were heard on exam, will provide albuterol inhaler given that this is a common time of year for asthma flares.

## 2011-11-27 NOTE — Assessment & Plan Note (Signed)
Not improving with gabapentin.  Will taper gabapentin and start nortriptyline.  Instructions given on escalating dose and warned about development of SI.  Encouraged continued activity, but at decreased level given muscle aches. Follow up in 4 weeks.

## 2011-11-27 NOTE — Progress Notes (Signed)
  Subjective:    Patient ID: Amanda Dickerson, female    DOB: April 09, 1960, 51 y.o.   MRN: [id number]  HPI Amanda Dickerson is here for follow up.  1. Fibromyalgia: No real improvement on gabapentin.  Continues to have heart fluttering after some doses as well as headaches.  Pain worse in right neck/shoulder, also achy in left leg.  Left leg pain in calf and shin.  Has been walking 15 minutes daily on the treadmill.  Pain is worse at end of exercise.  2. Skin tags: Still wants removed via cryoablation  3. Shortness of breath: reports history of intermittent asthma and this feels sort of like that. Reports that she typically has flares once in a while, but usually does not require any medication.  Mostly when she is reading aloud.  No chest tightness of wheezes.  I have reviewed and updated the following as appropriate: allergies, current medications and problem list SHx: never smoker   Review of Systems See  HPI    Objective:   Physical Exam BP 125/85  Pulse 60  Ht 5' 4"  (1.626 m)  Wt 198 lb (89.812 kg)  BMI 33.99 kg/m2  LMP 04/24/2011 Gen: alert, cooperative, NAD, appears tired HEENT: At/[place], sclera white, MMM CV: RRR, no murmurs Pulm: CTAB, no wheezes or rales Ext: no edema, 2+ DP pulses Left leg: no erythema or edema, mild tenderness along lateral shin, no calf tenderness, full ROM of ankle and knee; good toe walking; left foot fatigues quickly with heel walking      Assessment & Plan:  Skin Tags: Verbal consent obtained from patient.  Liquid nitrogen was applied to 3 skin tags on nape of neck with q-tip applicator until lesions remained white for 30 seconds.  Left leg pain: Likely MSK from increased walking; may be leaning toward shin splints.  Discussed alternating heat and cold as well as stretching.  Also recommended backing of on the walking until leg feels better and increasing slowly.

## 2011-11-27 NOTE — Patient Instructions (Addendum)
It was nice to see you again!  We are going to change your fibromyalgia medication. - Gabapentin: we are going to taper this off.  Take 39m three times a day fo 3 days, then 2036mthree times a day for 3 days, then 10050mhree times a day for 3 days, then stop. - Start nortriptyline 74m87mily at bedtime.  After 1 week increase to 50mg59mbedtime, then 1 week later to 75mg 1medtime.  If you start experiencing any thoughts of hurting yourself or someone else, please stop taking this medicine and give me a call.  I also sent an albuterol inhaler prescription.  Take every 4 hours as needed for shortness of breath.  Follow up in 4 weeks.

## 2011-11-28 ENCOUNTER — Telehealth: Payer: Self-pay | Admitting: Emergency Medicine

## 2011-11-28 MED ORDER — ALBUTEROL SULFATE HFA 108 (90 BASE) MCG/ACT IN AERS: 2.0000 | INHALATION_SPRAY | Freq: Four times a day (QID) | RESPIRATORY_TRACT | Status: DC | PRN

## 2011-11-28 MED ORDER — DULOXETINE HCL 30 MG PO CPEP: 60.0000 mg | ORAL_CAPSULE | Freq: Every day | ORAL | Status: DC

## 2011-11-28 NOTE — Telephone Encounter (Signed)
Prescriptions for cymbalta and albuterol inhaler placed in box to be faxed to [facility].

## 2011-11-28 NOTE — Telephone Encounter (Signed)
Her pharmacy could not fill her Rx.  [facility] doesn't have the Pamelor, but they can do Cymbalta.  It is going to be next week before they can fill it.  An Rx for Cymbalta and the Inhaler faxed to [phone].

## 2011-11-28 NOTE — Telephone Encounter (Signed)
Forward to PCP for refill request 

## 2011-12-10 ENCOUNTER — Other Ambulatory Visit: Payer: Self-pay | Admitting: Emergency Medicine

## 2011-12-10 DIAGNOSIS — R42 Dizziness and giddiness: Secondary | ICD-10-CM

## 2011-12-10 MED ORDER — OMEPRAZOLE 20 MG PO CPDR: 20.0000 mg | DELAYED_RELEASE_CAPSULE | Freq: Every day | ORAL | Status: DC

## 2011-12-10 MED ORDER — ALBUTEROL SULFATE HFA 108 (90 BASE) MCG/ACT IN AERS: 2.0000 | INHALATION_SPRAY | Freq: Four times a day (QID) | RESPIRATORY_TRACT | Status: DC | PRN

## 2011-12-11 ENCOUNTER — Ambulatory Visit (INDEPENDENT_AMBULATORY_CARE_PROVIDER_SITE_OTHER): Payer: Self-pay | Admitting: Obstetrics & Gynecology

## 2011-12-11 ENCOUNTER — Encounter: Payer: Self-pay | Admitting: Obstetrics & Gynecology

## 2011-12-11 VITALS — BP 122/81 | HR 68 | Temp 98.2°F | Resp 12 | Ht 65.0 in | Wt 199.0 lb

## 2011-12-11 DIAGNOSIS — N949 Unspecified condition associated with female genital organs and menstrual cycle: Secondary | ICD-10-CM

## 2011-12-11 DIAGNOSIS — R102 Pelvic and perineal pain: Secondary | ICD-10-CM

## 2011-12-11 NOTE — Patient Instructions (Signed)
Constipation, Adult Constipation is when a person has fewer than 3 bowel movements a week; has difficulty having a bowel movement; or has stools that are dry, hard, or larger than normal. As people grow older, constipation is more common. If you try to fix constipation with medicines that make you have a bowel movement (laxatives), the problem may get worse. Long-term laxative use may cause the muscles of the colon to become weak. A low-fiber diet, not taking in enough fluids, and taking certain medicines may make constipation worse. CAUSES   Certain medicines, such as antidepressants, pain medicine, iron supplements, antacids, and water pills.   Certain diseases, such as diabetes, irritable bowel syndrome (IBS), thyroid disease, or depression.   Not drinking enough water.   Not eating enough fiber-rich foods.   Stress or travel.  Lack of physical activity or exercise.  Not going to the restroom when there is the urge to have a bowel movement.  Ignoring the urge to have a bowel movement.  Using laxatives too much. SYMPTOMS   Having fewer than 3 bowel movements a week.   Straining to have a bowel movement.   Having hard, dry, or larger than normal stools.   Feeling full or bloated.   Pain in the lower abdomen.  Not feeling relief after having a bowel movement. DIAGNOSIS  Your caregiver will take a medical history and perform a physical exam. Further testing may be done for severe constipation. Some tests may include:   A barium enema X-ray to examine your rectum, colon, and sometimes, your small intestine.  A sigmoidoscopy to examine your lower colon.  A colonoscopy to examine your entire colon. TREATMENT  Treatment will depend on the severity of your constipation and what is causing it. Some dietary treatments include drinking more fluids and eating more fiber-rich foods. Lifestyle treatments may include regular exercise. If these diet and lifestyle recommendations  do not help, your caregiver may recommend taking over-the-counter laxative medicines to help you have bowel movements. Prescription medicines may be prescribed if over-the-counter medicines do not work.  HOME CARE INSTRUCTIONS   Increase dietary fiber in your diet, such as fruits, vegetables, whole grains, and beans. Limit high-fat and processed sugars in your diet, such as [place] fries, hamburgers, cookies, candies, and soda.   A fiber supplement may be added to your diet if you cannot get enough fiber from foods.   Drink enough fluids to keep your urine clear or pale yellow.   Exercise regularly or as directed by your caregiver.   Go to the restroom when you have the urge to go. Do not hold it.  Only take medicines as directed by your caregiver. Do not take other medicines for constipation without talking to your caregiver first. [place] IF:   You have bright red blood in your stool.   Your constipation lasts for more than 4 days or gets worse.   You have abdominal or rectal pain.   You have thin, pencil-like stools.  You have unexplained weight loss. MAKE SURE YOU:   Understand these instructions.  Will watch your condition.  Will get help right away if you are not doing well or get worse. Document Released: 11/16/2003 Document Revised: 05/12/2011 Document Reviewed: 01/21/2011 [facility] Patient Information 2013 [place]. Pelvic Pain Pelvic pain is pain below the belly button and located between your hips. Acute pain may last a few hours or days. Chronic pelvic pain may last weeks and months. The cause may be  different for different types of pain. The pain may be dull or sharp, mild or severe and can interfere with your daily activities. Write down and tell your caregiver:   Exactly where the pain is located.  If it comes and goes or is there all the time.  When it happens (with sex, urination, bowel movement, etc.)  If the pain is related  to your menstrual period or stress. Your caregiver will take a full history and do a complete physical exam and Pap test. CAUSES   Painful menstrual periods (dysmenorrhea).  Normal ovulation ([place]ch[place]z) that occurs in the middle of the menstrual cycle every month.  The pelvic organs get engorged with blood just before the menstrual period (pelvic congestive syndrome).  Scar tissue from an infection or past surgery (pelvic adhesions).  Cancer of the female pelvic organs. When there is pain with cancer, it has been there for a long time.  The lining of the uterus (endometrium) abnormally grows in places like the pelvis and on the pelvic organs (endometriosis).  A form of endometriosis with the lining of the uterus present inside of the muscle tissue of the uterus (adenomyosis).  Fibroid tumor (noncancerous) in the uterus.  Bladder problems such as infection, bladder spasms of the muscle tissue of the bladder.  Intestinal problems (irritable bowel syndrome, colitis, an ulcer or gastrointestinal infection).  Polyps of the cervix or uterus.  Pregnancy in the tube (ectopic pregnancy).  The opening of the cervix is too small for the menstrual blood to flow through it (cervical stenosis).  Physical or sexual abuse (past or present).  Musculo-skeletal problems from poor posture, problems with the vertebrae of the lower back or the uterine pelvic muscles falling (prolapse).  Psychological problems such as depression or stress.  IUD (intrauterine device) in the uterus. DIAGNOSIS  Tests to make a diagnosis depends on the type, location, severity and what causes the pain to occur. Tests that may be needed include:  Blood tests.  Urine tests  Ultrasound.  X-rays.  CT Scan.  MRI.  Laparoscopy.  Major surgery. TREATMENT  Treatment will depend on the cause of the pain, which includes:  Prescription or over-the-counter pain medication.  Antibiotics.  Birth control  pills.  Hormone treatment.  Nerve blocking injections.  Physical therapy.  Antidepressants.  Counseling with a psychiatrist or psychologist.  Minor or major surgery. HOME CARE INSTRUCTIONS   Only take over-the-counter or prescription medicines for pain, discomfort or fever as directed by your caregiver.  Follow your caregiver's advice to treat your pain.  Rest.  Avoid sexual intercourse if it causes the pain.  Apply warm or cold compresses (which ever works best) to the pain area.  Do relaxation exercises such as yoga or meditation.  Try acupuncture.  Avoid stressful situations.  Try group therapy.  If the pain is because of a stomach/intestinal upset, drink clear liquids, eat a bland light food diet until the symptoms go away. SEEK MEDICAL CARE IF:   You need stronger prescription pain medication.  You develop pain with sexual intercourse.  You have pain with urination.  You develop a temperature of 102 F (38.9 C) with the pain.  You are still in pain after 4 hours of taking prescription medication for the pain.  You need depression medication.  Your IUD is causing pain and you want it removed. SEEK IMMEDIATE MEDICAL CARE IF:  You develop very severe pain or tenderness.  You faint, have chills, severe weakness or dehydration.  You develop  heavy vaginal bleeding or passing solid tissue.  You develop a temperature of 102 F (38.9 C) with the pain.  You have blood in the urine.  You are being physically or sexually abused.  You have uncontrolled vomiting and diarrhea.  You are depressed and afraid of harming yourself or someone else. Document Released: 03/27/2004 Document Revised: 05/12/2011 Document Reviewed: 12/23/2007 [facility] Patient Information 2013 [place].

## 2011-12-11 NOTE — Progress Notes (Signed)
Here today because is having pelvic pain that started in September 2nd week.States has been diagnosed with Fibromyalgia and was told to start stretching and she thought that was what caused the pain. But pain is still continuing. Had ablation in June , 2013.  Had a yeast then also.

## 2011-12-11 NOTE — Progress Notes (Signed)
Subjective:     Patient ID: Amanda Dickerson, female   DOB: 1960/06/25, 51 y.o.   MRN: [id number]  HPI Pt c/o 2 episodes of pain in abd.  Now, c/o mild soreness in the area. Pt did not take anything for the pain.  Pt denies cycles since the endometrial ablation.  Pt reports BM irregular and seldom has a BM without a laxative   Review of Systems     Objective:   Physical ExamBP 122/81  Pulse 68  Temp 98.2 F (36.8 C) (Oral)  Resp 12  Ht 5' 5"  (1.651 m)  Wt 199 lb (90.266 kg)  BMI 33.12 kg/m2   Abd: soft, NT, ND obsese GU: EGBUS: no lesions Vagina: no blood in vault Cervix: no lesion; no CMT Uterus: small, mobile Adnexa: no masses; nontender         Assessment:     Pelvic pain- possibly related to constipation vs dysmenorrhea    Plan:     NSAIDS prn F/u prn Miralax 1 capful daily unti lregualr BM then QOD or prn  Mana Morison L. Harraway-Smith, M.D., Cherlynn June

## 2011-12-26 ENCOUNTER — Encounter: Payer: Self-pay | Admitting: Emergency Medicine

## 2011-12-26 ENCOUNTER — Ambulatory Visit (INDEPENDENT_AMBULATORY_CARE_PROVIDER_SITE_OTHER): Payer: Self-pay | Admitting: Emergency Medicine

## 2011-12-26 VITALS — BP 135/87 | HR 61 | Ht 65.0 in | Wt 199.0 lb

## 2011-12-26 DIAGNOSIS — IMO0001 Reserved for inherently not codable concepts without codable children: Secondary | ICD-10-CM

## 2011-12-26 DIAGNOSIS — M797 Fibromyalgia: Secondary | ICD-10-CM

## 2011-12-26 DIAGNOSIS — R002 Palpitations: Secondary | ICD-10-CM

## 2011-12-26 NOTE — Assessment & Plan Note (Signed)
Just started on Cymbalta 2 weeks ago.  Will continue cymbalta 61m daily and reassess in 1-2 months.

## 2011-12-26 NOTE — Patient Instructions (Addendum)
It was good to see you! Let's give the Cymbalta another month or so to see how it does. I will put in a referral to cardiology for a stress test to evaluate the palpitations and chest discomfort.  You should get a phone call with the appointment in the next 2 weeks. Follow up with me in 1-2 months.

## 2011-12-26 NOTE — Assessment & Plan Note (Signed)
Likely SVT.  Given report of some chest discomfort associated with the palpitations, it raises the concern for CAD.  Discussed options with patient including holter monitor, stress test, and BB with monitoring.  She opted for stress test. Will refer to cardiology for stress test.

## 2011-12-26 NOTE — Progress Notes (Signed)
  Subjective:    Patient ID: Amanda Dickerson, female    DOB: 04/01/1960, 51 y.o.   MRN: [id number]  HPI Amanda Dickerson is here for follow up.  1. Fibromyalgia: Just started cymbalta 2 weeks ago.  States pain is maybe a little better.  2. Palpitations: Reports continued palpitations despite stopping the gabapentin.  States it occurs 3-4 times per week.  Each episodes last a few minutes.  Associated with chest discomfort.  Does have some dizziness and nausea, but not related to the palpitations as far as she can tell.    3. Skin tags: One of the skin tags did not fall off after freezing them at the last appointment.  Patient would like the cut off.  I have reviewed and updated the following as appropriate: allergies and current medications SHx: never smoker  Review of Systems See HPI    Objective:   Physical Exam BP 135/87  Pulse 61  Ht 5' 5"  (1.651 m)  Wt 199 lb (90.266 kg)  BMI 33.12 kg/m2 Gen: alert, cooperative, NAD HEENT: AT/[place], sclera white, MMM Neck: supple CV: RRR, no murmurs Pulm: CTAB, no wheezes or rales Skin: 3 pedunculated skin tags on left side of neck     Assessment & Plan:  Skin Tags: Discussed options with patient including refreezing or cutting off.  She opted for cutting them off.   Informed consent was obtained.  Skin was cleaned with alcohol swabs.  Using suture removal scissors, the skin tags were removed.  Patient tolerated procedure well with minimal bleeding.

## 2012-01-19 ENCOUNTER — Encounter: Payer: Self-pay | Admitting: Cardiovascular Disease

## 2012-01-19 ENCOUNTER — Ambulatory Visit (INDEPENDENT_AMBULATORY_CARE_PROVIDER_SITE_OTHER): Payer: Self-pay | Admitting: Cardiovascular Disease

## 2012-01-19 VITALS — BP 149/94 | HR 69 | Ht 65.0 in | Wt 203.0 lb

## 2012-01-19 DIAGNOSIS — M797 Fibromyalgia: Secondary | ICD-10-CM

## 2012-01-19 DIAGNOSIS — IMO0001 Reserved for inherently not codable concepts without codable children: Secondary | ICD-10-CM

## 2012-01-19 DIAGNOSIS — R079 Chest pain, unspecified: Secondary | ICD-10-CM | POA: Insufficient documentation

## 2012-01-19 DIAGNOSIS — R002 Palpitations: Secondary | ICD-10-CM

## 2012-01-19 LAB — T4, FREE: Free T4: 0.64 ng/dL (ref 0.60–1.60)

## 2012-01-19 NOTE — Assessment & Plan Note (Signed)
Benign sounding Check TSH/T4 and event monitor given frequency of symptoms

## 2012-01-19 NOTE — Progress Notes (Signed)
Patient ID: Amanda Dickerson, female   DOB: Mar 15, 1960, 51 y.o.   MRN: [id number] 51 yo referred by Dr Emerson Monte for palpitations.  Initially thought due to gabepentin but continued palpitations despite stopping the gabapentin. States it occurs 3-4 times per week. Each episodes last a few minutes. Associated with chest discomfort. Does have some dizziness and nausea, but not related to the palpitations as far as she can tell. Symptoms since April.  Family history of CAD.  Does not work due to fibromyalgia.  SSCP atypical center chest nonexertional Has exertional dyspnea as well No cough fever or sputum.  No GI overtones to pain and no GB disease nausea or vohmiting.    ROS: Denies fever, malais, weight loss, blurry vision, decreased visual acuity, cough, sputum, SOB, hemoptysis, pleuritic pain, palpitaitons, heartburn, abdominal pain, melena, lower extremity edema, claudication, or rash.  All other systems reviewed and negative   General: Affect appropriate Healthy:  appears stated age 82: normal Neck supple with no adenopathy JVP normal no bruits no thyromegaly Lungs clear with no wheezing and good diaphragmatic motion Heart:  S1/S2 no murmur,rub, gallop or click PMI normal Abdomen: benighn, BS positve, no tenderness, no AAA no bruit.  No HSM or HJR Distal pulses intact with no bruits No edema Neuro non-focal Skin warm and dry No muscular weakness  Medications Current Outpatient Prescriptions  Medication Sig Dispense Refill  . albuterol (PROVENTIL HFA;VENTOLIN HFA) 108 (90 BASE) MCG/ACT inhaler Inhale 2 puffs into the lungs every 6 (six) hours as needed for wheezing.  1 Inhaler  3  . cyclobenzaprine (FLEXERIL) 10 MG tablet Take 5 mg by mouth 3 (three) times daily as needed.      . DULoxetine (CYMBALTA) 30 MG capsule Take 2 capsules (60 mg total) by mouth daily. Take 1 tab daily for 1 week, then 2 tabs daily  60 capsule  3  . ferrous sulfate 325 (65 FE) MG EC tablet Take 325 mg by mouth  daily with breakfast.      . ibuprofen (ADVIL,MOTRIN) 200 MG tablet Take 400 mg by mouth daily as needed. pain      . omeprazole (PRILOSEC) 20 MG capsule Take 1 capsule (20 mg total) by mouth daily.  90 capsule  3    Allergies Penicillins  Family History: Family History  Problem Relation Age of Onset  . Hypertension Mother   . Heart disease Mother   . Hyperlipidemia Mother   . Diabetes Father   . Cancer Father     prostate    Social History: History   Social History  . Marital Status: Legally Separated    Spouse Name: N/A    Number of Children: N/A  . Years of Education: N/A   Occupational History  . Not on file.   Social History Main Topics  . Smoking status: Never Smoker   . Smokeless tobacco: Never Used  . Alcohol Use: Yes     Comment: occasionally  . Drug Use: No  . Sexually Active: No   Other Topics Concern  . Not on file   Social History Narrative  . No narrative on file    Electrocardiogram:  NSR rate 62 normal ECG   Assessment and Plan

## 2012-01-19 NOTE — Patient Instructions (Signed)
Your physician recommends that you schedule a follow-up appointment in: AS NEEDED Your physician recommends that you continue on your current medications as directed. Please refer to the Current Medication list given to you today.  Your physician has requested that you have a stress echocardiogram. For further information please visit [email or web address]. Please follow instruction sheet as given. DX 785.1  Your physician has recommended that you wear an event monitor. Event monitors are medical devices that record the heart's electrical activity. Doctors most often [place] these monitors to diagnose arrhythmias. Arrhythmias are problems with the speed or rhythm of the heartbeat. The monitor is a small, portable device. You can wear one while you do your normal daily activities. This is usually used to diagnose what is causing palpitations/syncope (passing out). DX 785.1  Your physician recommends that you return for lab work in: TODAY  TSH FREE T4  DX 785.1

## 2012-01-19 NOTE — Assessment & Plan Note (Signed)
Atypical and associated with dyspnea.  F/U stress echo

## 2012-01-19 NOTE — Assessment & Plan Note (Signed)
Seems functional F/U pain clinic  Not sure patient needs to be on permanent disability for this. !!

## 2012-01-28 ENCOUNTER — Other Ambulatory Visit: Payer: Self-pay | Admitting: Emergency Medicine

## 2012-01-28 MED ORDER — DULOXETINE HCL 60 MG PO CPEP: 60.0000 mg | ORAL_CAPSULE | Freq: Every day | ORAL | Status: DC

## 2012-02-06 ENCOUNTER — Ambulatory Visit (HOSPITAL_COMMUNITY): Payer: No Typology Code available for payment source | Attending: Cardiology | Admitting: Radiology

## 2012-02-06 ENCOUNTER — Ambulatory Visit (HOSPITAL_COMMUNITY): Payer: No Typology Code available for payment source | Attending: Cardiology

## 2012-02-06 DIAGNOSIS — R072 Precordial pain: Secondary | ICD-10-CM

## 2012-02-06 DIAGNOSIS — R002 Palpitations: Secondary | ICD-10-CM

## 2012-02-06 DIAGNOSIS — R0989 Other specified symptoms and signs involving the circulatory and respiratory systems: Secondary | ICD-10-CM

## 2012-02-06 DIAGNOSIS — E669 Obesity, unspecified: Secondary | ICD-10-CM | POA: Insufficient documentation

## 2012-02-06 NOTE — Progress Notes (Signed)
Echocardiogram performed.  

## 2012-02-09 ENCOUNTER — Telehealth: Payer: Self-pay | Admitting: Cardiovascular Disease

## 2012-02-09 NOTE — Telephone Encounter (Signed)
Pt rtn call re test results, pls call [phone]

## 2012-02-09 NOTE — Telephone Encounter (Signed)
LMTCB ./CY 

## 2012-02-11 NOTE — Telephone Encounter (Signed)
Pt.notified

## 2012-02-11 NOTE — Telephone Encounter (Signed)
F/u   Returning call back to nurse.

## 2012-03-19 ENCOUNTER — Encounter: Payer: Self-pay | Admitting: Emergency Medicine

## 2012-03-19 ENCOUNTER — Ambulatory Visit (INDEPENDENT_AMBULATORY_CARE_PROVIDER_SITE_OTHER): Payer: No Typology Code available for payment source | Admitting: Emergency Medicine

## 2012-03-19 VITALS — BP 117/82 | HR 60 | Ht 65.0 in | Wt 204.8 lb

## 2012-03-19 DIAGNOSIS — IMO0001 Reserved for inherently not codable concepts without codable children: Secondary | ICD-10-CM

## 2012-03-19 DIAGNOSIS — R079 Chest pain, unspecified: Secondary | ICD-10-CM

## 2012-03-19 DIAGNOSIS — Z01812 Encounter for preprocedural laboratory examination: Secondary | ICD-10-CM

## 2012-03-19 DIAGNOSIS — N938 Other specified abnormal uterine and vaginal bleeding: Secondary | ICD-10-CM

## 2012-03-19 DIAGNOSIS — M5430 Sciatica, unspecified side: Secondary | ICD-10-CM

## 2012-03-19 DIAGNOSIS — N949 Unspecified condition associated with female genital organs and menstrual cycle: Secondary | ICD-10-CM

## 2012-03-19 DIAGNOSIS — M797 Fibromyalgia: Secondary | ICD-10-CM

## 2012-03-19 MED ORDER — DULOXETINE HCL 60 MG PO CPEP: 60.0000 mg | ORAL_CAPSULE | Freq: Every day | ORAL | Status: DC

## 2012-03-19 MED ORDER — CYCLOBENZAPRINE HCL 10 MG PO TABS: 5.0000 mg | ORAL_TABLET | Freq: Three times a day (TID) | ORAL | Status: DC | PRN

## 2012-03-19 NOTE — Assessment & Plan Note (Signed)
Of everything we have tried, cymbalta has worked the best, but it makes her a little foggy.  I could try Lyrica, but this would be a cost issue as it does appear to be available via [facility].  She may also have a component of sciatica today.  Will refer to PT and refill flexeril.  Patient would prefer to stay on something, so will continue cymbalta at 31m daily.  Follow up after 4-6 weeks of PT.  If no improvement, I will likely refer to pain management as she has failed gabapentin, nortriptyline, and effexor.

## 2012-03-19 NOTE — Patient Instructions (Addendum)
[facility] at this time. We will continue the Cymbalta 1 tablet daily. I will refill flexeril. I have also put in an order for physical therapy. Follow up with me after 4-6 weeks of physical therapy.  If there is still no improvement, we will likely get an MRI and refer you to pain management.

## 2012-03-19 NOTE — Assessment & Plan Note (Signed)
Had normal stress echo.

## 2012-03-19 NOTE — Progress Notes (Signed)
  Subjective:    Patient ID: Amanda Dickerson, female    DOB: January 07, 1961, 52 y.o.   MRN: [id number]  HPI Amanda Dickerson is here for f/u of fibromyalgia.  She reports that the cymbalta has helped more than anything else, particularly with sleep, but it does make her feel a little foggy.  Her pain today is mostly in her legs.  Located throughout both legs, L worse than R.  Also describes some numbness and tingling that travels down the outside of her left leg to her foot.  Reports subjective weakness.   Also states was seen by Cardiology and had a normal stress test.  They are working on setting up an event monitor for her palpitations.   I have reviewed and updated the following as appropriate: allergies and current medications SHx: never smoker  Review of Systems See HPI    Objective:   Physical Exam BP 117/82  Pulse 60  Ht 5' 5"  (1.651 m)  Wt 204 lb 12.8 oz (92.897 kg)  BMI 34.08 kg/m2 Gen: alert, cooperative, NAD, appears tired HEENT: AT/[place], sclera white, MMM Neck: supple, no LAD CV: RRR, no murmurs Pulm: CTAB, no wheezes or rales Ext: no edema, 2+ DP pulses bilaterally Neuro: 5/5 strength in bilateral hip flexors, quads, hamstrings, plantar flexion; sensation grossly intact; 1+ and symmetric patellar reflexes; SLR negative bilaterally, but hamstrings are extremely tight     Assessment & Plan:

## 2012-03-25 ENCOUNTER — Telehealth: Payer: Self-pay | Admitting: Emergency Medicine

## 2012-03-25 NOTE — Telephone Encounter (Signed)
[facility] says they have not rec'd the script for her Flexeril - pls advise

## 2012-03-25 NOTE — Telephone Encounter (Signed)
[place] and spoke with Diane.and gave refill information for Flexeril . Patient notified.

## 2012-03-31 ENCOUNTER — Ambulatory Visit: Payer: No Typology Code available for payment source | Admitting: Rehabilitative and Restorative Service Providers"

## 2012-04-07 ENCOUNTER — Ambulatory Visit
Payer: No Typology Code available for payment source | Attending: Emergency Medicine | Admitting: Rehabilitative and Restorative Service Providers"

## 2012-04-07 DIAGNOSIS — M2550 Pain in unspecified joint: Secondary | ICD-10-CM | POA: Insufficient documentation

## 2012-04-07 DIAGNOSIS — IMO0001 Reserved for inherently not codable concepts without codable children: Secondary | ICD-10-CM | POA: Insufficient documentation

## 2012-04-07 DIAGNOSIS — M6281 Muscle weakness (generalized): Secondary | ICD-10-CM | POA: Insufficient documentation

## 2012-04-14 ENCOUNTER — Ambulatory Visit: Payer: No Typology Code available for payment source | Admitting: Rehabilitative and Restorative Service Providers"

## 2012-05-03 ENCOUNTER — Encounter: Payer: Self-pay | Admitting: Physical Therapy

## 2012-05-05 ENCOUNTER — Ambulatory Visit: Payer: No Typology Code available for payment source | Attending: Emergency Medicine | Admitting: Physical Therapy

## 2012-05-05 DIAGNOSIS — M2550 Pain in unspecified joint: Secondary | ICD-10-CM | POA: Insufficient documentation

## 2012-05-05 DIAGNOSIS — IMO0001 Reserved for inherently not codable concepts without codable children: Secondary | ICD-10-CM | POA: Insufficient documentation

## 2012-05-05 DIAGNOSIS — M6281 Muscle weakness (generalized): Secondary | ICD-10-CM | POA: Insufficient documentation

## 2012-05-12 ENCOUNTER — Ambulatory Visit: Payer: No Typology Code available for payment source | Admitting: Physical Therapy

## 2012-05-12 ENCOUNTER — Encounter: Payer: No Typology Code available for payment source | Admitting: Physical Therapy

## 2012-05-19 ENCOUNTER — Ambulatory Visit: Payer: No Typology Code available for payment source | Admitting: Physical Therapy

## 2012-05-26 ENCOUNTER — Ambulatory Visit: Payer: No Typology Code available for payment source | Admitting: Physical Therapy

## 2012-06-02 ENCOUNTER — Encounter: Payer: No Typology Code available for payment source | Admitting: Physical Therapy

## 2012-06-09 ENCOUNTER — Encounter: Payer: No Typology Code available for payment source | Admitting: Physical Therapy

## 2012-06-17 ENCOUNTER — Ambulatory Visit (INDEPENDENT_AMBULATORY_CARE_PROVIDER_SITE_OTHER): Payer: No Typology Code available for payment source | Admitting: Emergency Medicine

## 2012-06-17 ENCOUNTER — Encounter: Payer: Self-pay | Admitting: Emergency Medicine

## 2012-06-17 VITALS — BP 128/76 | HR 68 | Ht 65.0 in | Wt 200.0 lb

## 2012-06-17 DIAGNOSIS — IMO0001 Reserved for inherently not codable concepts without codable children: Secondary | ICD-10-CM

## 2012-06-17 DIAGNOSIS — M797 Fibromyalgia: Secondary | ICD-10-CM

## 2012-06-17 MED ORDER — TRAZODONE HCL 50 MG PO TABS: 25.0000 mg | ORAL_TABLET | Freq: Every evening | ORAL | Status: DC | PRN

## 2012-06-17 NOTE — Assessment & Plan Note (Addendum)
Stable.  Encouraged continued activity and stretching.  Will continue cymbalta.  Will add trazodone to help with sleep.  Recommended she try benadryl the next few days as the [facility] pharmacy is closed for Easter.  If benadryl works, she will not fill trazodone.  Re-iterated that goal is functionality not pain free.  Right knee pain seems a little different from her typical fibromyalgia pain.  Suspect some arthritis.  Will check standing films.  Will also get left hip x-ray given persistent pain there as well.

## 2012-06-17 NOTE — Progress Notes (Signed)
  Subjective:    Patient ID: Amanda Dickerson, female    DOB: 1960-11-05, 52 y.o.   MRN: [id number]  HPI Amanda Dickerson is here for fibromyalgia.  She reports continued diffuse muscle aches, mostly in the legs and left shoulder.  States PT didn't really help much.  States the stretching felt good, but did not really improve the pain much.  Taking cymbalta with helps a little but continues to make her feel a little foggy.  She is able to function and does her own shopping and house work.  Reports trouble with both falling asleep and staying asleep.    Also reports some right knee pain that is inside the knee, worse with stairs and kneeling.  I have reviewed and updated the following as appropriate: allergies and current medications SHx: never smoker  Review of Systems See hPI    Objective:   Physical Exam BP 128/76  Pulse 68  Ht 5' 5"  (1.651 m)  Wt 200 lb (90.719 kg)  BMI 33.28 kg/m2 Gen: alert, cooperative, NAD HEENT: AT/[place], sclera white, MMM Neck: supple CV: RRR, no murmurs Pulm: CTAB, no wheezes or rales Ext: no edema, 2+ DP pulses bilaterally Right knee: + crepitus, no point tenderness, no joint laxity, full AROM MSK: hamstring tightness is improved, remains tight in hip rotators.     Assessment & Plan:  Patient mentioned possible disability.  I recommended that she find an occupational physician to do a disability evaluation.  Informed her that the [employer] would likely NOT pay for this.

## 2012-06-17 NOTE — Patient Instructions (Signed)
It was nice to see you! Please continue your daily activity and stretching. Get some benadryl (diphenhydramine) at [facility].  Take 1 tablet about 30 minutes before you go to bed to help with sleep. If the benadryl does not work, you can [facility] the Trazodone [facility] to use for sleep. Follow up in 1 month.

## 2012-07-12 ENCOUNTER — Ambulatory Visit (INDEPENDENT_AMBULATORY_CARE_PROVIDER_SITE_OTHER): Payer: No Typology Code available for payment source | Admitting: Emergency Medicine

## 2012-07-12 ENCOUNTER — Encounter: Payer: Self-pay | Admitting: Emergency Medicine

## 2012-07-12 VITALS — BP 137/87 | HR 67 | Temp 99.1°F | Ht 65.0 in | Wt 197.6 lb

## 2012-07-12 DIAGNOSIS — L0291 Cutaneous abscess, unspecified: Secondary | ICD-10-CM

## 2012-07-12 DIAGNOSIS — L0390 Cellulitis, unspecified: Secondary | ICD-10-CM | POA: Insufficient documentation

## 2012-07-12 MED ORDER — SULFAMETHOXAZOLE-TMP DS 800-160 MG PO TABS: 1.0000 | ORAL_TABLET | Freq: Two times a day (BID) | ORAL | Status: DC

## 2012-07-12 NOTE — Assessment & Plan Note (Addendum)
Likely from bug bite on left medial thigh.  Other lesions do not appear infected, but with significant inflammation.  No scale to indicate fungal infection.  Will treat with Bactrim DS BID x10 days.  Also recommended oral benadryl at bedtime and cream during the day to help with itching and inflammation.  She has appt scheduled on Thursday for fibromyalgia and will recheck cellulitic area then.

## 2012-07-12 NOTE — Patient Instructions (Addendum)
It was nice to see you! I don't know what bit you, but you are having a response to some kind of bug bite.   One bite, on your left leg, looks like it might be infected. Take Bactrim 1 pill twice a day for 10 days. Get some benadryl cream to apply to the other spots. Follow up in Thursday so I can take another look.

## 2012-07-12 NOTE — Progress Notes (Signed)
  Subjective:    Patient ID: Amanda Dickerson, female    DOB: 1960/03/08, 52 y.o.   MRN: [id number]  HPI Amanda Dickerson is here for a SDA for bug bites.  She reports that she developed red bumps on Saturday on her neck, back of the right thigh and the inside of the left thigh like bug bites.  Never saw any bugs or ticks.  They are itching sometimes burning.  She washed her sheets and sprayed her mattress pad with lysol.  Since then she has developed one new spot on her right elbow.  Denies any pain, fevers, chills, nausea.  Has tried hydrocortisone cream and neosporin without much improvement.  Reports that she had a ringworm infection about 3 weeks ago that she treated with over the counter medicines.   I have reviewed and updated the following as appropriate: allergies and current medications SHx: never smoker   Review of Systems See HPI    Objective:   Physical Exam BP 137/87  Pulse 67  Temp(Src) 99.1 F (37.3 C) (Oral)  Ht 5' 5"  (1.651 m)  Wt 197 lb 9.6 oz (89.631 kg)  BMI 32.88 kg/m2 Gen: alert, cooperative, NAD Skin:  -Neck: 1x2cm area of blanching erythema with induration on nape of neck and a second 1x1cm lesion at the hairline, no scale -right elbow: 1x1cm area of blanching erythema with central induration, no scale -right posterior thigh: 4x4cm area of blanching erythema with centeral induration, no scale -left medial thigh: 8x11cm area of blanching erythema with induration and a 2x2cm central area of non-blanching erythema, no scale No fluctuance of any lesion     Assessment & Plan:

## 2012-07-15 ENCOUNTER — Ambulatory Visit (INDEPENDENT_AMBULATORY_CARE_PROVIDER_SITE_OTHER): Payer: No Typology Code available for payment source | Admitting: Emergency Medicine

## 2012-07-15 ENCOUNTER — Encounter: Payer: Self-pay | Admitting: Emergency Medicine

## 2012-07-15 VITALS — BP 121/80 | HR 66 | Temp 98.6°F | Ht 65.0 in | Wt 197.0 lb

## 2012-07-15 DIAGNOSIS — IMO0001 Reserved for inherently not codable concepts without codable children: Secondary | ICD-10-CM

## 2012-07-15 DIAGNOSIS — M797 Fibromyalgia: Secondary | ICD-10-CM

## 2012-07-15 DIAGNOSIS — L0390 Cellulitis, unspecified: Secondary | ICD-10-CM

## 2012-07-15 DIAGNOSIS — L0291 Cutaneous abscess, unspecified: Secondary | ICD-10-CM

## 2012-07-15 MED ORDER — FLUCONAZOLE 150 MG PO TABS: 150.0000 mg | ORAL_TABLET | Freq: Once | ORAL | Status: DC

## 2012-07-15 NOTE — Progress Notes (Signed)
  Subjective:    Patient ID: Amanda Dickerson, female    DOB: [birth year], 52 y.o.   MRN: [id number]  HPI Amanda Dickerson is here for f/u fibromyalgia.  1. Fibromyalgia: Continues to have pain primarily in her legs, rated as 4/10 today.  The left leg pain is isolated to her achilles tendon.  The right leg pain is in the whole leg and throbbing.  Seems to start in her buttock and travel around her thigh, worse with driving and sitting, better if the leg is fully extended.  Does sometimes feel like it gives out, mostly right after standing.    2. Recheck bug bites: Tolerating the bactrim.  Reports the spots are much better.  I have reviewed and updated the following as appropriate: allergies and current medications SHx: never smoker   Review of Systems See HPI    Objective:   Physical Exam BP 121/80  Pulse 66  Temp(Src) 98.6 F (37 C) (Oral)  Ht 5' 5"  (1.651 m)  Wt 197 lb (89.359 kg)  BMI 32.78 kg/m2 Gen: alert, cooperative, NAD Right leg: minimally tender along entire leg, more tender over mid right buttock, 5/5 strength in hip flexion, quads, hamstrings Left ankle: no erythema or swelling, tender over achilles tendon Skin: bug bites are improved from last visit; left medial thigh lesion much better now 4cm in diameter and central non-blanching erythema improved as well     Assessment & Plan:

## 2012-07-15 NOTE — Assessment & Plan Note (Addendum)
Reviewed goals of care - in particular that goal is not pain free, but managing the pain and functionality.  Overall seems to be doing very well.  Sleep improved with medication.  Today's symptoms seem more consistent with piriformis syndrome on the right and achilles tendonitis on the left.  Provided exercises.  Follow up in 2-4 weeks.

## 2012-07-15 NOTE — Assessment & Plan Note (Signed)
Much better.  Will finish 10 day course of Bactrim.

## 2012-07-15 NOTE — Patient Instructions (Addendum)
It was nice to see you! Overall, I think we are doing better with the fibromyalgia. The left ankle pain seems to be your achilles tendon - please do the exercise I showed you 10 times once a day - you can work up to this number. Part the the right leg pain may be related to your piriformis - a muscle in your butt.  Please do the stretches on the handout 1-2 times a day. Follow up in 2-4 weeks.

## 2012-07-16 ENCOUNTER — Ambulatory Visit (HOSPITAL_COMMUNITY)
Admission: RE | Admit: 2012-07-16 | Discharge: 2012-07-16 | Disposition: A | Discharge status: Home / Self Care | Payer: No Typology Code available for payment source | Source: Ambulatory Visit | Attending: Family Medicine | Admitting: Family Medicine

## 2012-07-16 DIAGNOSIS — M25559 Pain in unspecified hip: Secondary | ICD-10-CM | POA: Insufficient documentation

## 2012-07-16 DIAGNOSIS — M25569 Pain in unspecified knee: Secondary | ICD-10-CM | POA: Insufficient documentation

## 2012-07-16 DIAGNOSIS — M797 Fibromyalgia: Secondary | ICD-10-CM

## 2012-07-22 ENCOUNTER — Telehealth: Payer: Self-pay | Admitting: Emergency Medicine

## 2012-07-22 NOTE — Telephone Encounter (Signed)
Please advise about continued "bites" . Advised to take Benadryl at night ( pt reports that it makes her drowsy) and to use benadryl cream /calamine lotion. If no improvement by Tuesday - to call first thing in the morning and make appointment to evaluated further.Thanks! Tildon Husky, RN-BSN

## 2012-07-22 NOTE — Telephone Encounter (Signed)
Was here last week for bites and she is now having more bites - wants to know if she needs to be seen again or can we call in more of the same medicine for her.  pls advise

## 2012-07-30 ENCOUNTER — Ambulatory Visit (INDEPENDENT_AMBULATORY_CARE_PROVIDER_SITE_OTHER): Payer: No Typology Code available for payment source | Admitting: Emergency Medicine

## 2012-07-30 ENCOUNTER — Encounter: Payer: Self-pay | Admitting: Emergency Medicine

## 2012-07-30 VITALS — BP 125/86 | HR 61 | Ht 65.0 in | Wt 197.0 lb

## 2012-07-30 DIAGNOSIS — M25579 Pain in unspecified ankle and joints of unspecified foot: Secondary | ICD-10-CM

## 2012-07-30 DIAGNOSIS — M25572 Pain in left ankle and joints of left foot: Secondary | ICD-10-CM

## 2012-07-30 DIAGNOSIS — IMO0001 Reserved for inherently not codable concepts without codable children: Secondary | ICD-10-CM

## 2012-07-30 DIAGNOSIS — M797 Fibromyalgia: Secondary | ICD-10-CM

## 2012-07-30 DIAGNOSIS — M76829 Posterior tibial tendinitis, unspecified leg: Secondary | ICD-10-CM | POA: Insufficient documentation

## 2012-07-30 NOTE — Assessment & Plan Note (Signed)
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  All of her pain can be difficult to differentiate.  She has been doing the eccentric exercises for 2 weeks now without improvement.  Will continue another 4 weeks.  If no improvement, will refer to sports medicine for possible ultrasound and nitroglycerin protocol if indicated.

## 2012-07-30 NOTE — Patient Instructions (Addendum)
It was nice to see you! You are doing a wonderful job. Please continue to slowly increase your activity - if you have really bad pain after activity, it means you are doing too much too soon. Continue the exercises you have been doing. I will see you back in 1 month.  If you ankle is not improving, I will refer you to sports medicine for further evaluation and treatment.

## 2012-07-30 NOTE — Assessment & Plan Note (Addendum)
Sleep is improved with trazodone.  Continues to have primarily achy right leg pain, particularly after activity.  X-rays show some joint space narrowing on the left knee.  Some early arthritis may also be contributing to her pain.  She will continue the exercises given at the last appt.  Continue cymbalta.  Continue to gradually increase activity - may need to back off some currently based on pain.  Discussed scheduled tylenol to see if that helps with possible arthritic component.  Discussed that goal is management not pain-free.  Also discussed that improvement will be gradual and she may not notice a lot of change from day to day.  Follow up in 1 month.

## 2012-07-30 NOTE — Progress Notes (Signed)
  Subjective:    Patient ID: Amanda Dickerson, female    DOB: 09/20/1960, 52 y.o.   MRN: [id number]  HPI Amanda Dickerson is here for f/u fibromyalgia.  1. Fibromyalgia: She reports that her sleep is much improved with the trazodone, taking 1m qHS.  She has not noticed any improvement in her pain.  She has been doing the piriformis exercises.  She reports doing activities such as grocery shopping, taking out the trash, and walking on a treadmill.  She states that often her legs (particularly the right leg) will throb like a toothache about 30 minutes after completing the activity.  She also states that if she walks to fast, she feels like she stumbles a lot.   2. Left ankle pain: Has been doing the exercises given, not really noticing any improvement.    I have reviewed and updated the following as appropriate: allergies and current medications SHx: never smoker  Review of Systems See HPI    Objective:   Physical Exam BP 125/86  Pulse 61  Ht 5' 5"  (1.651 m)  Wt 197 lb (89.359 kg)  BMI 32.78 kg/m2 Gen: alert, cooperative, NAD HEENT: AT/Amanda Dickerson, sclera white, MMM Neck: supple CV: RRR, no murmurs Pulm: CTAB, no wheezes or rales Ext: no edema MSK: 5/5 strength without pain in hip flexors, quads and hamstrings bilaterally     Assessment & Plan:

## 2012-08-09 ENCOUNTER — Other Ambulatory Visit: Payer: Self-pay | Admitting: *Deleted

## 2012-08-09 MED ORDER — DULOXETINE HCL 60 MG PO CPEP: 60.0000 mg | ORAL_CAPSULE | Freq: Every day | ORAL | Status: DC

## 2012-08-09 NOTE — Telephone Encounter (Signed)
Requested Prescriptions   Pending Prescriptions Disp Refills  . DULoxetine (CYMBALTA) 60 MG capsule 30 capsule 3    Sig: Take 1 capsule (60 mg total) by mouth daily.   Tildon Husky, RN-BSN

## 2012-08-13 ENCOUNTER — Other Ambulatory Visit: Payer: Self-pay | Admitting: *Deleted

## 2012-08-13 DIAGNOSIS — R42 Dizziness and giddiness: Secondary | ICD-10-CM

## 2012-08-13 MED ORDER — OMEPRAZOLE 20 MG PO CPDR: 20.0000 mg | DELAYED_RELEASE_CAPSULE | Freq: Every day | ORAL | Status: DC

## 2012-08-13 NOTE — Telephone Encounter (Signed)
Fax states that [facility] can provide Protonix or Nexium @ no charge for pt if you would consider change in PPI for pt - please advise - if yes then please fax new script to Health dept.  Tildon Husky, RN-BSN

## 2012-09-24 ENCOUNTER — Encounter: Payer: Self-pay | Admitting: Emergency Medicine

## 2012-09-24 ENCOUNTER — Ambulatory Visit (INDEPENDENT_AMBULATORY_CARE_PROVIDER_SITE_OTHER): Payer: No Typology Code available for payment source | Admitting: Emergency Medicine

## 2012-09-24 VITALS — BP 135/90 | HR 79 | Temp 98.2°F | Wt 198.0 lb

## 2012-09-24 DIAGNOSIS — N938 Other specified abnormal uterine and vaginal bleeding: Secondary | ICD-10-CM

## 2012-09-24 DIAGNOSIS — IMO0001 Reserved for inherently not codable concepts without codable children: Secondary | ICD-10-CM

## 2012-09-24 DIAGNOSIS — Z01812 Encounter for preprocedural laboratory examination: Secondary | ICD-10-CM

## 2012-09-24 DIAGNOSIS — M25572 Pain in left ankle and joints of left foot: Secondary | ICD-10-CM

## 2012-09-24 DIAGNOSIS — M25579 Pain in unspecified ankle and joints of unspecified foot: Secondary | ICD-10-CM

## 2012-09-24 DIAGNOSIS — M797 Fibromyalgia: Secondary | ICD-10-CM

## 2012-09-24 DIAGNOSIS — N949 Unspecified condition associated with female genital organs and menstrual cycle: Secondary | ICD-10-CM

## 2012-09-24 DIAGNOSIS — N925 Other specified irregular menstruation: Secondary | ICD-10-CM

## 2012-09-24 MED ORDER — CITALOPRAM HYDROBROMIDE 20 MG PO TABS: 20.0000 mg | ORAL_TABLET | Freq: Every day | ORAL | Status: DC

## 2012-09-24 MED ORDER — CYCLOBENZAPRINE HCL 10 MG PO TABS: 5.0000 mg | ORAL_TABLET | Freq: Three times a day (TID) | ORAL | Status: DC | PRN

## 2012-09-24 NOTE — Assessment & Plan Note (Signed)
Functionally remains independent and doing some things for herself. Concern for mood changes on Cymbalta. Will stop cymbalta. Prescription for celexa given; she will take to the health department. If unable to get the celexa, will call with medication available through [facility]. Follow up in 1 month.

## 2012-09-24 NOTE — Progress Notes (Signed)
  Subjective:    Patient ID: Amanda Dickerson, female    DOB: Jul 17, 1960, 52 y.o.   MRN: [id number]  HPI [name] is here for f/u fibromyalgia.  Fibromyalgia She reports worsening of her mood over the last 2 months.  Denies any recent stressors.  Finds herself crying for no reason.  She is still able to function and complete her ADLs.  She has gone out to some cookouts over the last few months, but does not stay for long.  Her pain is currently mostly in her bilateral upper arms, worse with any lifting but has full range of motion.  Reports sleep is going well with trazodone.  Finds the flexeril to be helpful as well.  No SI.   Left ankle pain Remains the same.  Pain located over achilles tendon.  Constant. Present for years.  Has been doing the eccentric exercises daily without improvement.  States she does not wear heels; usually wears sneakers.  No weakness or numbness.  She is also applying for disability.  I discussed with her that I cannot fill out the physical disability form, but I will fill out the mental impairment questionnaire.  I have reviewed and updated the following as appropriate: allergies and current medications SHx: never smoker   Review of Systems See HPI    Objective:   Physical Exam BP 135/90  Pulse 79  Temp(Src) 98.2 F (36.8 C) (Oral)  Wt 198 lb (89.812 kg)  BMI 32.95 kg/m2 Gen: alert, cooperative, NAD, appears depressed Arms: no tenderness to palpation; no erythema or edema; 5/5 strength without pain in shoulder abduction, elbow flexion and elbow extension Left ankle: no erythema, edema or obvious deformity; mild tenderness along and around achilles tendon; 5/5 strength in dorsi and plantar flexion; 4/5 strength in inversion and eversion but poor effort      Assessment & Plan:  Will fill out mental impairment form.  Office to call patient when completed.  Recommended she go to an occupational physician for the physical impairment form.

## 2012-09-24 NOTE — Assessment & Plan Note (Signed)
Pain is located over achilles tendon. Has been doing eccentric exercises for 6 weeks without improvement. Will refer to [facility] for further evaluation and management.

## 2012-09-24 NOTE — Patient Instructions (Addendum)
It was nice to see you! I think your mood problems are related to the cymbalta. Please STOP the cymbalta. Start Celexa 47m daily.  This medicine is $4 at [facility] The sports medicine office should be calling you to schedule an appointment for the left ankle. Follow up in 1 month.

## 2012-09-29 ENCOUNTER — Telehealth: Payer: Self-pay | Admitting: Emergency Medicine

## 2012-09-29 NOTE — Telephone Encounter (Signed)
I have filled out the mental impairment form for disability.  It is ready for pick up at the front desk.

## 2012-10-01 ENCOUNTER — Telehealth: Payer: Self-pay | Admitting: Emergency Medicine

## 2012-10-01 NOTE — Telephone Encounter (Signed)
Will fwd to MD.  Dymon Summerhill L, CMA  

## 2012-10-01 NOTE — Telephone Encounter (Signed)
Pt called stating the the [facility] program does not carry the Celexa medication so she would like Prozac instead. JW

## 2012-10-05 ENCOUNTER — Ambulatory Visit (INDEPENDENT_AMBULATORY_CARE_PROVIDER_SITE_OTHER): Payer: No Typology Code available for payment source | Admitting: Family Medicine

## 2012-10-05 VITALS — BP 130/80 | Ht 65.0 in | Wt 198.0 lb

## 2012-10-05 DIAGNOSIS — M76829 Posterior tibial tendinitis, unspecified leg: Secondary | ICD-10-CM

## 2012-10-05 DIAGNOSIS — M76822 Posterior tibial tendinitis, left leg: Secondary | ICD-10-CM

## 2012-10-05 MED ORDER — MELOXICAM 15 MG PO TABS: 15.0000 mg | ORAL_TABLET | Freq: Every day | ORAL | Status: DC

## 2012-10-05 NOTE — Patient Instructions (Addendum)
You have posterior tibial tendonitis. Wear insoles in shoes Ice 2 x per day Do exercises daily Followup 1 month

## 2012-10-06 NOTE — Progress Notes (Signed)
CC: Left ankle pain HPI: Patient is a 52 year old female who presents with left-sided ankle pain on the medial aspect of the left ankle for about one year. She says the pain is intermittent and is on and off. Denies any trigger, injury, or new activities at the start of pain. When pain does come on it is sharp in nature. She is unable to name any inciting factors but thinks it might be worse when she is on her feet a lot. She has tried a heat pad without any relief. She sometimes feels like her ankle wants to give out. Denies any previous injury. No swelling. Has not tried any medications for this.  ROS: As above in the HPI. All other systems are stable or negative.  PMH: Fibromyalgia and asthma  PSH: None  Social: Patient is unemployed. She does not smoke or drink alcohol. Family: Family history is positive for diabetes, high blood pressure, and heart disease  Allergies: Penicillin causes nausea and vomiting    OBJECTIVE: APPEARANCE:  Patient in no acute distress.The patient appeared well nourished and normally developed. HEENT: No scleral icterus. Conjunctiva non-injected Resp: Non labored Skin: No rash MSK:  Ankle: No visible erythema or swelling. Range of motion is full in all directions. Strength is 5/5 in all directions. Stable lateral and medial ligaments; squeeze test and kleiger test unremarkable; Talar dome nontender; Tenderness to palpation just posterior to medial malleolus along course of posterior tibialis. No pain at base of 5th MT; No tenderness over cuboid; No tenderness over N spot or navicular prominence No tenderness on posterior aspects of lateral and medial malleolus No sign of peroneal tendon subluxations; Negative tarsal tunnel tinel's Able to walk 4 steps.  With weightbearing patient is noted to have collapse of longitudinal arch with over pronation and descent of navicular prominence  MSK [place]: Transverse and longitudinal views of the medial ankle were  performed in a limited ultrasound. There was hypoechoic signal around the posterior tibialis tendon visualized both and transverse and longitudinal views. No evidence of tendon tear. Tendon became normal in appearance as we approached the navicular or tracted  proximally . Hypoechoic signal concerning for posterior tibial tendinitis   ASSESSMENT: #1. Posterior tibial tendinitis   PLAN: At this point, will give patient sport insoles with scaphoid pad for improved arch support to prevent arch collapse and pronation. Patient given home exercises to do on a daily basis. Recommended use of ice twice a day. Also given meloxicam 15 mg daily for anti-inflammatory effect. Followup in one month.

## 2012-10-11 MED ORDER — FLUOXETINE HCL 20 MG PO TABS: 20.0000 mg | ORAL_TABLET | Freq: Every day | ORAL | Status: DC

## 2012-10-11 NOTE — Addendum Note (Signed)
Addended by: Sibyl Parr on: 10/11/2012 09:11 AM   Modules accepted: Orders, Medications

## 2012-10-11 NOTE — Telephone Encounter (Signed)
I have faxed a prescription for prozac 29m daily to [facility].   Will need f/u with me in 1 month.

## 2012-10-11 NOTE — Telephone Encounter (Signed)
LMOVM for pt to schedule an appt within a month.   Amanda Dickerson, Amanda Dickerson, Amanda Dickerson

## 2012-10-25 ENCOUNTER — Ambulatory Visit (INDEPENDENT_AMBULATORY_CARE_PROVIDER_SITE_OTHER): Payer: No Typology Code available for payment source | Admitting: Emergency Medicine

## 2012-10-25 ENCOUNTER — Encounter: Payer: Self-pay | Admitting: Emergency Medicine

## 2012-10-25 VITALS — BP 135/83 | HR 59 | Ht 65.0 in | Wt 187.0 lb

## 2012-10-25 DIAGNOSIS — IMO0001 Reserved for inherently not codable concepts without codable children: Secondary | ICD-10-CM

## 2012-10-25 DIAGNOSIS — M797 Fibromyalgia: Secondary | ICD-10-CM

## 2012-10-25 DIAGNOSIS — J4520 Mild intermittent asthma, uncomplicated: Secondary | ICD-10-CM | POA: Insufficient documentation

## 2012-10-25 DIAGNOSIS — J45909 Unspecified asthma, uncomplicated: Secondary | ICD-10-CM

## 2012-10-25 MED ORDER — ALBUTEROL SULFATE HFA 108 (90 BASE) MCG/ACT IN AERS: 2.0000 | INHALATION_SPRAY | Freq: Four times a day (QID) | RESPIRATORY_TRACT | Status: DC | PRN

## 2012-10-25 NOTE — Assessment & Plan Note (Addendum)
Stable. Using her "tricks" to stay functional. Has not been able to start prozac yet, as [facility] does not have it yet. Discussed again the benefits of regular exercise.

## 2012-10-25 NOTE — Patient Instructions (Addendum)
It was nice to see you!  Hopefully, [facility] with have the medicine soon.  In the meantime, keep up the stretches and try to start walking for 20 minutes a day.  If you start using the albuterol more than 3x a week, let me know.  We also need to start screening for colon cancer.  Once you reapply for the orange card, we will do stool cards every year.  Follow up 2 weeks after starting the Prozac.

## 2012-10-25 NOTE — Assessment & Plan Note (Signed)
Well controlled. Using albuterol 2-3x per week. If needing more albuterol, will get PFTs and likely start a controller.

## 2012-10-25 NOTE — Progress Notes (Signed)
  Subjective:    Patient ID: Amanda Dickerson, female    DOB: [birth year], 52 y.o.   MRN: [id number]  HPI CHERI AYOTTE is here for f/u of fibromyalgia.  Fibromyalgia She reports that she had not started the Prozac yet as [facility] has not received it.  She has been intermittently taking cymbalta, flexeril and ibuprofen.  She feels her symptoms are stable.  Pain is in her legs today.  She has been doing the exercises and stretches given by myself and SM.  She has not been walking much due to her discomfort.  Asthma She states that she needs refills of her albuterol.  She reports using it 2-3 times per week.  No chest pain or shortness of breath.  I have reviewed and updated the following as appropriate: allergies and current medications SHx: never smoker - will need colon cancer screening with stool cards once she reapplies for the orange card   Review of Systems See HPI    Objective:   Physical Exam BP 135/83  Pulse 59  Ht 5' 5"  (1.651 m)  Wt 187 lb (84.823 kg)  BMI 31.12 kg/m2 Gen: alert, cooperative, NAD Legs: no swelling, no point tenderness, 5/5 strength bilaterally     Assessment & Plan:

## 2012-10-28 ENCOUNTER — Other Ambulatory Visit: Payer: Self-pay | Admitting: Emergency Medicine

## 2012-10-28 ENCOUNTER — Ambulatory Visit: Payer: No Typology Code available for payment source

## 2012-10-28 MED ORDER — FLUOXETINE HCL 20 MG PO TABS: 20.0000 mg | ORAL_TABLET | Freq: Every day | ORAL | Status: DC

## 2012-10-28 NOTE — Telephone Encounter (Signed)
Prescription printed and placed in to be faxed box.

## 2012-10-28 NOTE — Telephone Encounter (Signed)
Pt came in stating that she needs her prescription for Prozac sent to the pharmacy.  Pt give me medication assistance telephone # [phone], and fax [phone] also no electronic fax.

## 2012-10-28 NOTE — Telephone Encounter (Signed)
PLEASE FAX!! Tildon Husky, RN-BSN

## 2012-11-09 ENCOUNTER — Ambulatory Visit (INDEPENDENT_AMBULATORY_CARE_PROVIDER_SITE_OTHER): Payer: No Typology Code available for payment source | Admitting: Family Medicine

## 2012-11-09 VITALS — BP 123/84 | Ht 65.0 in | Wt 181.0 lb

## 2012-11-09 DIAGNOSIS — M76829 Posterior tibial tendinitis, unspecified leg: Secondary | ICD-10-CM

## 2012-11-09 DIAGNOSIS — M76822 Posterior tibial tendinitis, left leg: Secondary | ICD-10-CM

## 2012-11-09 NOTE — Patient Instructions (Signed)
Thank you for coming in today  My hope is that at our next visit you will be 50-60% better. Continue arch supports Try to work in home exercises 1 x per day Use meloxicam as needed. Followup in one month

## 2012-11-09 NOTE — Progress Notes (Signed)
CC: Followup left posterior tib tendinitis HPI: Patient is a very pleasant 52 year old female who has been dealing with left posterior tibialis tendinitis for the last year. When I last saw her we ultrasounded her and noticed increased hypoechoic fluid around the posterior tibialis tendon. She was fitted with sport insoles with scaphoid pad as well as started on a home exercise program, meloxicam, and ice regimen. Patient states that overall she is somewhat improved today. She thinks she is 25% better. She continues to have pain in the same location with prolonged activity such as grocery shopping. She does think that the sport insoles and meloxicam have helped her. Unfortunately, she has been having difficulty doing her exercises daily do to pain from her fibromyalgia.  ROS: As above in the HPI. All other systems are stable or negative.  OBJECTIVE: APPEARANCE:  Patient in no acute distress.The patient appeared well nourished and normally developed. HEENT: No scleral icterus. Conjunctiva non-injected Resp: Non labored Skin: No rash MSK:  Left ankle: Full range of motion in the ankle. Strength is 5 out of 5 in dorsiflexion, plantar flexion, inversion, eversion without pain. Patient did do a heel raise without pain. She is focally tender just off the inferior and posterior aspect of the medial malleolus in the area of the posterior tibialis tendon.  MSK [place]: Repeat limited musculoskeletal ultrasound to identify the posterior tibialis tendon was performed in transverse and longitudinal views today. Persistent hypoechoic change surrounding the posterior tibialis tendon was visualized and transverse orientation. In longitudinal views there was no evidence of tearing of the tendon but there was persistent hypoechoic change.   ASSESSMENT: #1. Posterior tibialis tendinitis, slowly improving   PLAN: We did discuss the possibility of nitroglycerin patches with the patient today. However, she has bad  migraine so we decided not to pursue this treatment at this time. She is making slow but steady progress which would be expected given her prolonged symptoms prior to treatment. She'll continue to wear the sport insoles as well as use the meloxicam as needed on days when she has more pain. I have asked her to try to fit in doing home exercises every day as this will be the key to real improvement. She will followup again in one month.

## 2012-11-15 ENCOUNTER — Telehealth: Payer: Self-pay | Admitting: Emergency Medicine

## 2012-11-15 NOTE — Telephone Encounter (Signed)
Pt called because the new medication that Dr. Bridgett Larsson called for her, they will not refill or give to her until Dr. Joneen Boers them a call them to clarify which medications she takes and does not take. JW

## 2012-11-15 NOTE — Telephone Encounter (Signed)
Per pt request, message left at [facility] that we D/c pt Cymbalta and started her on Prozac.  Krishna Dancel, Loralyn [place], [place]

## 2012-11-15 NOTE — Telephone Encounter (Signed)
She is to stop the cymbalta and start prozac.  If they need additional documentation from me, please let me know.

## 2012-12-07 ENCOUNTER — Ambulatory Visit: Payer: No Typology Code available for payment source | Admitting: Family Medicine

## 2012-12-08 ENCOUNTER — Encounter (HOSPITAL_COMMUNITY): Payer: Self-pay | Admitting: Emergency Medicine

## 2012-12-08 ENCOUNTER — Emergency Department (HOSPITAL_COMMUNITY)
Admission: EM | Admit: 2012-12-08 | Discharge: 2012-12-08 | Disposition: A | Discharge status: Home / Self Care | Payer: No Typology Code available for payment source | Source: Home / Self Care | Attending: Emergency Medicine | Admitting: Emergency Medicine

## 2012-12-08 DIAGNOSIS — J039 Acute tonsillitis, unspecified: Secondary | ICD-10-CM

## 2012-12-08 LAB — POCT RAPID STREP A: Streptococcus, Group A Screen (Direct): NEGATIVE

## 2012-12-08 MED ORDER — CLINDAMYCIN HCL 300 MG PO CAPS: 300.0000 mg | ORAL_CAPSULE | Freq: Four times a day (QID) | ORAL | Status: DC

## 2012-12-08 MED ORDER — HYDROCODONE-ACETAMINOPHEN 5-325 MG PO TABS: ORAL_TABLET | ORAL | Status: DC

## 2012-12-08 MED ORDER — PREDNISONE 20 MG PO TABS: 20.0000 mg | ORAL_TABLET | Freq: Two times a day (BID) | ORAL | Status: DC

## 2012-12-08 MED ORDER — HYDROCORTISONE-ACETIC ACID 1-2 % OT SOLN: 3.0000 [drp] | Freq: Four times a day (QID) | OTIC | Status: DC

## 2012-12-08 NOTE — ED Notes (Signed)
Pt    Reports  Symptoms  Of  biody  Aches   Chills headache  With  Fever   With  Swollen  Glands  On r  Side of her neck    She is  Sitting  Upright on  Exam table  Speaking in  Complete  sentances  In no  Distress

## 2012-12-08 NOTE — ED Provider Notes (Signed)
Chief Complaint:   Chief Complaint  Patient presents with  . Sore Throat    History of Present Illness:   Amanda Dickerson is a 52 year old female who has had a three-day history of sore throat on the right, pain with swallowing, sweats, has felt feverish, had headache, and sore, swollen glands in her neck. She denies any nasal congestion, rhinorrhea, cough, or GI symptoms. No known exposure to strep. No prior history of strep, tonsillitis, or mono.  Review of Systems:  Other than as noted above, the patient denies any of the following symptoms. Systemic:  No fever, chills, sweats, fatigue, myalgias, headache, or anorexia. Eye:  No redness, pain or drainage. ENT:  No earache, ear congestion, nasal congestion, sneezing, rhinorrhea, sinus pressure, sinus pain, or post nasal drip. Lungs:  No cough, sputum production, wheezing, shortness of breath, or chest pain. GI:  No abdominal pain, nausea, vomiting, or diarrhea. Skin:  No rash or itching.  [place]:  Past medical history, family history, social history, meds, allergies, and nurse's notes were reviewed.  There is no known exposure to strep or mono.  No prior history of step or mono.  The patient denies use of tobacco. She is allergic to penicillin. She takes Cymbalta.  Physical Exam:   Vital signs:  BP 135/82  Pulse 88  Temp(Src) 99.7 F (37.6 C) (Oral)  Resp 20  SpO2 98% General:  Alert, in no distress. Eye:  No conjunctival injection or drainage. Lids were normal. ENT:  TMs and canals were normal, without erythema or inflammation.  Nasal mucosa was clear and uncongested, without drainage.  Mucous membranes were moist.  Exam of pharynx reveals the right tonsil to be enlarged and swollen with spots of white exudate. There was no bulging of the anterior tonsillar pillar or deviation of the uvula.  There were no oral ulcerations or lesions. Neck:  Supple, no adenopathy, tenderness or mass. Lungs:  No respiratory distress.  Lungs were clear to  auscultation, without wheezes, rales or rhonchi.  Breath sounds were clear and equal bilaterally.  Heart:  Regular rhythm, without gallops, murmers or rubs. Skin:  Clear, warm, and dry, without rash or lesions.  Labs:   Results for orders placed during the hospital encounter of 12/08/12  POCT RAPID STREP A ([facility]RG CARE ONLY)      Result Value Range   Streptococcus, Group A Screen (Direct) NEGATIVE  NEGATIVE    Assessment:  The encounter diagnosis was Tonsillitis.  No evidence of peritonsillar abscess.  Plan:   1.  Meds:  The following meds were prescribed:   New Prescriptions   ACETIC ACID-HYDROCORTISONE (VOSOL-HC) OTIC SOLUTION    Place 3 drops into both ears 4 (four) times daily.   CLINDAMYCIN (CLEOCIN) 300 MG CAPSULE    Take 1 capsule (300 mg total) by mouth 4 (four) times daily.   HYDROCODONE-ACETAMINOPHEN (NORCO/VICODIN) 5-325 MG PER TABLET    1 to 2 tabs every 4 to 6 hours as needed for pain.   PREDNISONE (DELTASONE) 20 MG TABLET    Take 1 tablet (20 mg total) by mouth 2 (two) times daily.    2.  Patient Education/Counseling:  The patient was given appropriate handouts, self care instructions, and instructed in symptomatic relief, including hot saline gargles, throat lozenges, infectious precautions, and need to trade out toothbrush.   3.  Follow up:  The patient was told to follow up if no better in 3 to 4 days, if becoming worse in any way, and  given some red flag symptoms such as difficulty swallowing or breathing which would prompt immediate return.  Follow up here if necessary.     Harden Mo, MD 12/08/12 [phone]

## 2012-12-09 ENCOUNTER — Other Ambulatory Visit: Payer: Self-pay | Admitting: Emergency Medicine

## 2012-12-09 MED ORDER — ESOMEPRAZOLE MAGNESIUM 40 MG PO CPDR: 40.0000 mg | DELAYED_RELEASE_CAPSULE | Freq: Every day | ORAL | Status: DC

## 2012-12-09 NOTE — Telephone Encounter (Signed)
Changed omeprazole to nexium as [facility] can get nexium for no cost to patient.

## 2012-12-10 LAB — CULTURE, GROUP A STREP

## 2013-03-17 ENCOUNTER — Encounter: Payer: Self-pay | Admitting: Emergency Medicine

## 2013-03-17 ENCOUNTER — Ambulatory Visit (INDEPENDENT_AMBULATORY_CARE_PROVIDER_SITE_OTHER): Payer: Medicaid Other | Admitting: Emergency Medicine

## 2013-03-17 VITALS — BP 122/85 | HR 66 | Temp 98.4°F | Ht 65.0 in | Wt 188.0 lb

## 2013-03-17 DIAGNOSIS — IMO0001 Reserved for inherently not codable concepts without codable children: Secondary | ICD-10-CM

## 2013-03-17 DIAGNOSIS — M797 Fibromyalgia: Secondary | ICD-10-CM

## 2013-03-17 DIAGNOSIS — D649 Anemia, unspecified: Secondary | ICD-10-CM

## 2013-03-17 LAB — CBC
HEMATOCRIT: 41.8 % (ref 36.0–46.0)
Hemoglobin: 14.4 g/dL (ref 12.0–15.0)
MCH: 28 pg (ref 26.0–34.0)
MCHC: 34.4 g/dL (ref 30.0–36.0)
MCV: 81.2 fL (ref 78.0–100.0)
PLATELETS: 269 10*3/uL (ref 150–400)
RBC: 5.15 MIL/uL — AB (ref 3.87–5.11)
RDW: 13.4 % (ref 11.5–15.5)
WBC: 3.8 10*3/uL — ABNORMAL LOW (ref 4.0–10.5)

## 2013-03-17 LAB — COMPLETE METABOLIC PANEL WITH GFR
ALT: 21 U/L (ref 0–35)
AST: 18 U/L (ref 0–37)
Albumin: 4.5 g/dL (ref 3.5–5.2)
Alkaline Phosphatase: 73 U/L (ref 39–117)
BUN: 13 mg/dL (ref 6–23)
CO2: 26 mEq/L (ref 19–32)
CREATININE: 0.65 mg/dL (ref 0.50–1.10)
Calcium: 9.4 mg/dL (ref 8.4–10.5)
Chloride: 107 mEq/L (ref 96–112)
GFR, Est Non African American: >89 mL/min
Glucose, Bld: 76 mg/dL (ref 70–99)
Potassium: 4 mEq/L (ref 3.5–5.3)
Sodium: 143 mEq/L (ref 135–145)
Total Bilirubin: 0.3 mg/dL (ref 0.3–1.2)
Total Protein: 7.6 g/dL (ref 6.0–8.3)

## 2013-03-17 MED ORDER — FLUOXETINE HCL 20 MG PO TABS: 40.0000 mg | ORAL_TABLET | Freq: Every day | ORAL | Status: DC

## 2013-03-17 NOTE — Assessment & Plan Note (Signed)
Worse in cold weather. Increase Prozac to 58m daily. Follow up in 2-4 weeks.

## 2013-03-17 NOTE — Patient Instructions (Signed)
It was nice to see you!  Keep moving as much as you can - the cold is going to make it a little worse.  We are going to increase the Prozac to 63m daily.  You can take 2 of the 213mtablets.  Follow up in 2-4 weeks to see where we're at.

## 2013-03-17 NOTE — Progress Notes (Signed)
   Subjective:    Patient ID: Amanda Dickerson, female    DOB: 22-Sep-1960, 53 y.o.   MRN: [id number]  HPI Amanda Dickerson is here for f/u fibromyaglia.  She states that she was finally able to get the Prozac in November.  She has been taking 5m daily.  She reports continued mood swings and diffuse body aches.  States her legs have been worse with the colder weather.  She did qualify for disability.  I have reviewed and updated the following as appropriate: allergies and current medications SHx: non smoker  Review of Systems See HPI    Objective:   Physical Exam BP 122/85  Pulse 66  Temp(Src) 98.4 F (36.9 C) (Oral)  Ht 5' 5"  (1.651 m)  Wt 188 lb (85.276 kg)  BMI 31.28 kg/m2 Gen: alert, cooperative, NAD HEENT: AT/, sclera white, MMM Neck: supple CV: RRR, no murmurs Pulm: CTAB, no wheezes or rales Ext: no edema, no muscular tenderness     Assessment & Plan:

## 2013-04-15 ENCOUNTER — Encounter: Payer: Self-pay | Admitting: Emergency Medicine

## 2013-04-15 ENCOUNTER — Ambulatory Visit (INDEPENDENT_AMBULATORY_CARE_PROVIDER_SITE_OTHER): Payer: Medicaid Other | Admitting: Emergency Medicine

## 2013-04-15 VITALS — BP 120/85 | HR 60 | Temp 98.1°F | Ht 65.0 in | Wt 193.0 lb

## 2013-04-15 DIAGNOSIS — M797 Fibromyalgia: Secondary | ICD-10-CM

## 2013-04-15 DIAGNOSIS — IMO0001 Reserved for inherently not codable concepts without codable children: Secondary | ICD-10-CM

## 2013-04-15 DIAGNOSIS — R42 Dizziness and giddiness: Secondary | ICD-10-CM

## 2013-04-15 LAB — GLUCOSE, CAPILLARY: GLUCOSE-CAPILLARY: 101 mg/dL — AB (ref 70–99)

## 2013-04-15 LAB — TSH: TSH: 1.207 u[IU]/mL (ref 0.350–4.500)

## 2013-04-15 MED ORDER — FLUOXETINE HCL 20 MG PO TABS: 20.0000 mg | ORAL_TABLET | Freq: Two times a day (BID) | ORAL | Status: DC

## 2013-04-15 NOTE — Patient Instructions (Signed)
It was nice to see you!  I'm glad the prozac is helping. I would like you to start taking 71m in the morning and 239min the evening.  We are checking your thyroid today.  Make sure you are drinking plenty of fluids.  Follow up in 2 weeks.

## 2013-04-15 NOTE — Assessment & Plan Note (Signed)
More frequent since increasing Prozac. No syncopal events. Will split the dose of Prozac  - 2m BID to see if that will help. Will also check TSH today. Discussed possible mechanisms including vaso-vagal and medication.  I do not think this is cardiac at this time. F/u in 2 weeks.

## 2013-04-15 NOTE — Addendum Note (Signed)
Addended by: Lazaro Arms on: 04/15/2013 10:17 AM   Modules accepted: Orders

## 2013-04-15 NOTE — Assessment & Plan Note (Signed)
Improved on prozac 46m daily.

## 2013-04-15 NOTE — Progress Notes (Signed)
   Subjective:    Patient ID: Amanda Dickerson, female    DOB: 10/02/1960, 53 y.o.   MRN: [id number]  HPI [name] is here for f/u fibromyalgia and dizziness.  Fibromyalgia She states she is doing a little better on the Prozac 39m daily.  She is able to rest better as well.  Dizziness She has a long history of dizzy spells. These had improved, but have been worsening over the last month or so.  She reports dizzy spells most days, anywhere from 1-3 per day.  They occur at random, can be while walking, driving or sitting.  Last a few seconds, maybe a minute.  No associated shortness of breath, chest pain, palpitations or diaphoresis.  No numbness, tingling or burning sensation.  States that it feels like she is going to black out; denies any spinning sensation.  Current Outpatient Prescriptions on File Prior to Visit  Medication Sig Dispense Refill  . acetic acid-hydrocortisone (VOSOL-HC) otic solution Place 3 drops into both ears 4 (four) times daily.  10 mL  5  . albuterol (PROVENTIL HFA;VENTOLIN HFA) 108 (90 BASE) MCG/ACT inhaler Inhale 2 puffs into the lungs every 6 (six) hours as needed for wheezing.  1 Inhaler  3  . clindamycin (CLEOCIN) 300 MG capsule Take 1 capsule (300 mg total) by mouth 4 (four) times daily.  40 capsule  0  . cyclobenzaprine (FLEXERIL) 10 MG tablet Take 0.5 tablets (5 mg total) by mouth 3 (three) times daily as needed.  30 tablet  2  . esomeprazole (NEXIUM) 40 MG capsule Take 1 capsule (40 mg total) by mouth daily.  30 capsule  11  . ferrous sulfate 325 (65 FE) MG EC tablet Take 325 mg by mouth daily with breakfast.      . HYDROcodone-acetaminophen (NORCO/VICODIN) 5-325 MG per tablet 1 to 2 tabs every 4 to 6 hours as needed for pain.  20 tablet  0  . ibuprofen (ADVIL,MOTRIN) 200 MG tablet Take 400 mg by mouth daily as needed. pain      . meloxicam (MOBIC) 15 MG tablet Take 1 tablet (15 mg total) by mouth daily.  30 tablet  2  . predniSONE (DELTASONE) 20 MG tablet  Take 1 tablet (20 mg total) by mouth 2 (two) times daily.  10 tablet  0  . traZODone (DESYREL) 50 MG tablet Take 0.5-1 tablets (25-50 mg total) by mouth at bedtime as needed for sleep.  30 tablet  3   No current facility-administered medications on file prior to visit.    I have reviewed and updated the following as appropriate: allergies and current medications SHx: never smoker  Review of Systems See HPI    Objective:   Physical Exam BP 120/85  Pulse 60  Temp(Src) 98.1 F (36.7 C) (Oral)  Ht 5' 5"  (1.651 m)  Wt 193 lb (87.544 kg)  BMI 32.12 kg/m2 Gen: alert, cooperative, NAD HEENT: AT/[place], sclera white, MMM, PERRL Neck: supple CV: RRR, no murmurs Pulm: CTAB, no wheezes or rales Neuro: CN II-XII intact bilaterally; 5/5 strength in all extremities      Assessment & Plan:

## 2013-05-06 ENCOUNTER — Ambulatory Visit: Payer: Medicaid Other | Admitting: Emergency Medicine

## 2013-05-13 ENCOUNTER — Encounter: Payer: Self-pay | Admitting: Emergency Medicine

## 2013-05-13 ENCOUNTER — Ambulatory Visit (INDEPENDENT_AMBULATORY_CARE_PROVIDER_SITE_OTHER): Payer: Medicaid Other | Admitting: Emergency Medicine

## 2013-05-13 VITALS — BP 124/77 | HR 58 | Temp 98.5°F | Ht 65.0 in | Wt 192.0 lb

## 2013-05-13 DIAGNOSIS — R42 Dizziness and giddiness: Secondary | ICD-10-CM

## 2013-05-13 DIAGNOSIS — M797 Fibromyalgia: Secondary | ICD-10-CM

## 2013-05-13 DIAGNOSIS — IMO0001 Reserved for inherently not codable concepts without codable children: Secondary | ICD-10-CM

## 2013-05-13 NOTE — Progress Notes (Signed)
   Subjective:    Patient ID: Amanda Dickerson, female    DOB: 05/24/1960, 53 y.o.   MRN: [id number]  HPI Amanda Dickerson is here for f/u fibromyalgia and dizziness.  I saw her last month for f/u fibromyalgia.  She reported doing better on the prozac 11m daily, but was having increased "woozy" spells.  We split the dose to 259mBID.  She reports that she did better on the split dose, only having 1-2 spells a week.  This last week she has had a few more.  Described as "woozy" and having to "force myself to focus."  They last a few seconds.    Current Outpatient Prescriptions on File Prior to Visit  Medication Sig Dispense Refill  . acetic acid-hydrocortisone (VOSOL-HC) otic solution Place 3 drops into both ears 4 (four) times daily.  10 mL  5  . albuterol (PROVENTIL HFA;VENTOLIN HFA) 108 (90 BASE) MCG/ACT inhaler Inhale 2 puffs into the lungs every 6 (six) hours as needed for wheezing.  1 Inhaler  3  . cyclobenzaprine (FLEXERIL) 10 MG tablet Take 0.5 tablets (5 mg total) by mouth 3 (three) times daily as needed.  30 tablet  2  . esomeprazole (NEXIUM) 40 MG capsule Take 1 capsule (40 mg total) by mouth daily.  30 capsule  11  . ferrous sulfate 325 (65 FE) MG EC tablet Take 325 mg by mouth daily with breakfast.      . FLUoxetine (PROZAC) 20 MG tablet Take 1 tablet (20 mg total) by mouth 2 (two) times daily.  60 tablet  11  . HYDROcodone-acetaminophen (NORCO/VICODIN) 5-325 MG per tablet 1 to 2 tabs every 4 to 6 hours as needed for pain.  20 tablet  0  . ibuprofen (ADVIL,MOTRIN) 200 MG tablet Take 400 mg by mouth daily as needed. pain      . meloxicam (MOBIC) 15 MG tablet Take 1 tablet (15 mg total) by mouth daily.  30 tablet  2  . predniSONE (DELTASONE) 20 MG tablet Take 1 tablet (20 mg total) by mouth 2 (two) times daily.  10 tablet  0  . traZODone (DESYREL) 50 MG tablet Take 0.5-1 tablets (25-50 mg total) by mouth at bedtime as needed for sleep.  30 tablet  3   No current facility-administered  medications on file prior to visit.    I have reviewed and updated the following as appropriate: allergies and current medications SHx: non smoker   Review of Systems See HPI    Objective:   Physical Exam BP 124/77  Pulse 58  Temp(Src) 98.5 F (36.9 C) (Oral)  Ht 5' 5"  (1.651 m)  Wt 192 lb (87.091 kg)  BMI 31.95 kg/m2 Gen: alert, cooperative, appears tired but no distress      Assessment & Plan:

## 2013-05-13 NOTE — Assessment & Plan Note (Signed)
Improved with the BID dosing of Prozac. Patient will continue to monitor at home. No red flags. F/u in 2 months, sooner if worsening.

## 2013-05-13 NOTE — Patient Instructions (Signed)
It was nice to see you!  I'm glad the dizzy spells improved - I think they are worse this last week due to the weather changes.  We will continue the Prozac for now.  Things you can do... 1. Stay as active as possible. 2. Eat healthy - lots of veggies and fruits. 3. Get 8 hours of sleep a night. 4. Make sure you stay hydrated - 8-10 glasses of water a day.  Follow up in 2 months or sooner if the spells are getting worse.

## 2013-05-13 NOTE — Assessment & Plan Note (Signed)
Continues to do slightly better on the Prozac. Continue 73m BID dosing. Discussed non-medical management briefly with patient as in AVS. F/u in 2 months.

## 2013-06-20 ENCOUNTER — Other Ambulatory Visit: Payer: Self-pay | Admitting: *Deleted

## 2013-06-21 MED ORDER — ALBUTEROL SULFATE HFA 108 (90 BASE) MCG/ACT IN AERS: 2.0000 | INHALATION_SPRAY | Freq: Four times a day (QID) | RESPIRATORY_TRACT | Status: DC | PRN

## 2013-07-13 ENCOUNTER — Encounter: Payer: Self-pay | Admitting: *Deleted

## 2013-07-13 ENCOUNTER — Other Ambulatory Visit: Payer: Self-pay | Admitting: Emergency Medicine

## 2013-07-13 ENCOUNTER — Ambulatory Visit (INDEPENDENT_AMBULATORY_CARE_PROVIDER_SITE_OTHER): Payer: Medicaid Other | Admitting: Emergency Medicine

## 2013-07-13 ENCOUNTER — Encounter: Payer: Self-pay | Admitting: Emergency Medicine

## 2013-07-13 VITALS — BP 137/84 | HR 61 | Temp 97.8°F | Ht 65.0 in | Wt 195.4 lb

## 2013-07-13 DIAGNOSIS — N949 Unspecified condition associated with female genital organs and menstrual cycle: Secondary | ICD-10-CM

## 2013-07-13 DIAGNOSIS — M797 Fibromyalgia: Secondary | ICD-10-CM

## 2013-07-13 DIAGNOSIS — Z01812 Encounter for preprocedural laboratory examination: Secondary | ICD-10-CM

## 2013-07-13 DIAGNOSIS — R232 Flushing: Secondary | ICD-10-CM

## 2013-07-13 DIAGNOSIS — N951 Menopausal and female climacteric states: Secondary | ICD-10-CM

## 2013-07-13 DIAGNOSIS — N925 Other specified irregular menstruation: Secondary | ICD-10-CM

## 2013-07-13 DIAGNOSIS — N938 Other specified abnormal uterine and vaginal bleeding: Secondary | ICD-10-CM

## 2013-07-13 DIAGNOSIS — IMO0001 Reserved for inherently not codable concepts without codable children: Secondary | ICD-10-CM

## 2013-07-13 MED ORDER — VENLAFAXINE HCL 37.5 MG PO TABS: 37.5000 mg | ORAL_TABLET | Freq: Two times a day (BID) | ORAL | Status: DC

## 2013-07-13 MED ORDER — ALBUTEROL SULFATE HFA 108 (90 BASE) MCG/ACT IN AERS
2.0000 | INHALATION_SPRAY | Freq: Four times a day (QID) | RESPIRATORY_TRACT | Status: DC | PRN
Start: 2013-07-13 — End: 2013-07-13

## 2013-07-13 MED ORDER — CYCLOBENZAPRINE HCL 10 MG PO TABS: 5.0000 mg | ORAL_TABLET | Freq: Three times a day (TID) | ORAL | Status: DC | PRN

## 2013-07-13 MED ORDER — TRAZODONE HCL 50 MG PO TABS: 25.0000 mg | ORAL_TABLET | Freq: Every evening | ORAL | Status: DC | PRN

## 2013-07-13 MED ORDER — ALBUTEROL SULFATE HFA 108 (90 BASE) MCG/ACT IN AERS: 2.0000 | INHALATION_SPRAY | Freq: Four times a day (QID) | RESPIRATORY_TRACT | Status: DC | PRN

## 2013-07-13 MED ORDER — ESOMEPRAZOLE MAGNESIUM 40 MG PO CPDR: 40.0000 mg | DELAYED_RELEASE_CAPSULE | Freq: Every day | ORAL | Status: DC

## 2013-07-13 MED ORDER — PANTOPRAZOLE SODIUM 40 MG PO TBEC: 40.0000 mg | DELAYED_RELEASE_TABLET | Freq: Every day | ORAL | Status: DC

## 2013-07-13 NOTE — Patient Instructions (Addendum)
It was nice to see you!  Stop the prozac. Start Effexor 37.68m twice a day. This will help with fibromyalgia and hot flashes.  The Sports Medicine doctor is probably recommended a nitroglycerin patch to try and help the tendons heal.    I sent [facility] other medicines to [facility] well.   Follow up in 1 month.

## 2013-07-13 NOTE — Progress Notes (Signed)
Patient ID: Amanda Dickerson, female   DOB: 1960/03/04, 52 y.o.   MRN: [id number] Prescription changed to pantoprazole which is on the preferred list.

## 2013-07-13 NOTE — Progress Notes (Signed)
   Subjective:    Patient ID: Amanda Dickerson, female    DOB: May 06, 1960, 53 y.o.   MRN: [id number]  HPI Amanda Dickerson is here for followup fibromyalgia and hot flashes.  Fibromyalgia She states this has been worse over the last month. Reports pain throughout her entire body. She's been unable to walk for the last month much due to her peroneal tendinitis. She also has not been sleeping well secondary to hot flashes. She's continued to take the Prozac 20 mg twice a day, although she states this does not help much anymore. She is also having increased dizziness associated with the Prozac.  Hot flashes She had an ablation in June 2014. She has not had any menstrual periods since that time. She reports severe hot flashes that are very frequent. They keep her up at night.  Current Outpatient Prescriptions on File Prior to Visit  Medication Sig Dispense Refill  . ferrous sulfate 325 (65 FE) MG EC tablet Take 325 mg by mouth daily with breakfast.       No current facility-administered medications on file prior to visit.    I have reviewed and updated the following as appropriate: allergies and current medications SHx: never smoker  Review of Systems See HPI    Objective:   Physical Exam BP 137/84  Pulse 61  Temp(Src) 97.8 F (36.6 C) (Oral)  Ht 5' 5"  (1.651 m)  Wt 195 lb 6.4 oz (88.633 kg)  BMI 32.52 kg/m2 Gen: alert, cooperative, NAD HEENT: AT/[place], sclera white, MMM Neck: supple CV: RRR, no murmurs Pulm: CTAB, no wheezes or rales      Assessment & Plan:

## 2013-07-13 NOTE — Assessment & Plan Note (Signed)
Stop the Prozac and start Effexor 37.5 mg twice a day now that she has Medicaid. Also discussed importance of sleep and exercise. Recommended following up with sports medicine for nitroglycerin patches for the peroneal tendinitis. There is some confusion about this, and she thought they were trying to start sublingual nitroglycerin. Followup in one month.

## 2013-07-13 NOTE — Progress Notes (Signed)
Prior Authorization received from [facility] for Esomeprazole 40 mg cap. Formulary and PA form placed in provider box for completion. Derl Barrow, RN

## 2013-07-13 NOTE — Assessment & Plan Note (Signed)
These are getting worse, causing difficulty sleeping. We'll start Effexor. Followup in one month. If no improvement would recommend starting HRT.

## 2013-08-12 ENCOUNTER — Encounter: Payer: Self-pay | Admitting: Emergency Medicine

## 2013-08-12 ENCOUNTER — Other Ambulatory Visit (HOSPITAL_COMMUNITY)
Admission: RE | Admit: 2013-08-12 | Discharge: 2013-08-12 | Disposition: A | Discharge status: Home / Self Care | Payer: Medicaid Other | Source: Ambulatory Visit | Attending: Emergency Medicine | Admitting: Emergency Medicine

## 2013-08-12 ENCOUNTER — Telehealth: Payer: Self-pay | Admitting: *Deleted

## 2013-08-12 ENCOUNTER — Ambulatory Visit (INDEPENDENT_AMBULATORY_CARE_PROVIDER_SITE_OTHER): Payer: Medicaid Other | Admitting: Emergency Medicine

## 2013-08-12 VITALS — BP 128/84 | HR 88 | Temp 98.1°F | Wt 195.0 lb

## 2013-08-12 DIAGNOSIS — IMO0001 Reserved for inherently not codable concepts without codable children: Secondary | ICD-10-CM

## 2013-08-12 DIAGNOSIS — Z0000 Encounter for general adult medical examination without abnormal findings: Secondary | ICD-10-CM

## 2013-08-12 DIAGNOSIS — N951 Menopausal and female climacteric states: Secondary | ICD-10-CM

## 2013-08-12 DIAGNOSIS — Z124 Encounter for screening for malignant neoplasm of cervix: Secondary | ICD-10-CM | POA: Diagnosis not present

## 2013-08-12 DIAGNOSIS — M797 Fibromyalgia: Secondary | ICD-10-CM

## 2013-08-12 DIAGNOSIS — Z01419 Encounter for gynecological examination (general) (routine) without abnormal findings: Secondary | ICD-10-CM | POA: Diagnosis not present

## 2013-08-12 DIAGNOSIS — R232 Flushing: Secondary | ICD-10-CM

## 2013-08-12 DIAGNOSIS — Z1151 Encounter for screening for human papillomavirus (HPV): Secondary | ICD-10-CM | POA: Insufficient documentation

## 2013-08-12 MED ORDER — HYDROCORTISONE-ACETIC ACID 1-2 % OT SOLN: 3.0000 [drp] | Freq: Three times a day (TID) | OTIC | Status: DC

## 2013-08-12 MED ORDER — VENLAFAXINE HCL 75 MG PO TABS: 75.0000 mg | ORAL_TABLET | Freq: Two times a day (BID) | ORAL | Status: DC

## 2013-08-12 NOTE — Assessment & Plan Note (Signed)
Pap collected today. Handout given to schedule mammogram and colonoscopy.

## 2013-08-12 NOTE — Telephone Encounter (Signed)
Prior Authorization received from [facility] for Hydrocortison-Acetic Acid Soln. Verbal order by Dr. Bridgett Larsson to change Rx to acetic acid soln 10.0 ML; place 3 drops into both ears 3 times daily for 10 days.  Order given to [employer], Pharmacist at [facility].  Derl Barrow, RN

## 2013-08-12 NOTE — Patient Instructions (Signed)
It was nice to see you!  We increased the Effexor to 57m twice a day.  You can take 2 tablets twice a day until you run out.  When you get the new prescription, it will be for 755mtablets. Work on [place] your activity.  If you are having a lot of pain at night, it is likely because you are trying to do too much too soon.  You will get a letter with the results of your pap smear in the next 2 weeks.  If something is wrong, I will call you.  Please get a mammogram in the next 6 months. Please schedule a colonoscopy in the next 6 months.  Follow up in 1 month.

## 2013-08-12 NOTE — Progress Notes (Signed)
   Subjective:    Patient ID: Amanda Dickerson, female    DOB: [birth year], 53 y.o.   MRN: [id number]  HPI Amanda Dickerson is here for a Pap smear.  Patient here for routine exam. Current complaints: None. She does have a history of hot flashes. She states these are improved some on the Effexor. No vaginal bleeding, discharge.   Gynecologic History No LMP recorded. Patient has had an ablation. Contraception: none Last Pap: unknown. Results were: normal.  No history of abnormal pap per patient Last mammogram: 11/2011. Results were: normal  Obstetric History OB History  Gravida Para Term Preterm AB SAB TAB Ectopic Multiple Living  1 1 1       1     # Outcome Date GA Lbr Len/2nd Weight Sex Delivery Anes PTL Lv  1 TRM 1988     CS           Current Outpatient Prescriptions on File Prior to Visit  Medication Sig Dispense Refill  . albuterol (PROVENTIL HFA;VENTOLIN HFA) 108 (90 BASE) MCG/ACT inhaler Inhale 2 puffs into the lungs every 6 (six) hours as needed for wheezing.  1 Inhaler  3  . cyclobenzaprine (FLEXERIL) 10 MG tablet Take 0.5 tablets (5 mg total) by mouth 3 (three) times daily as needed.  30 tablet  3  . ferrous sulfate 325 (65 FE) MG EC tablet Take 325 mg by mouth daily with breakfast.      . pantoprazole (PROTONIX) 40 MG tablet Take 1 tablet (40 mg total) by mouth daily.  30 tablet  3  . traZODone (DESYREL) 50 MG tablet Take 0.5-1 tablets (25-50 mg total) by mouth at bedtime as needed for sleep.  30 tablet  3   No current facility-administered medications on file prior to visit.    I have reviewed and updated the following as appropriate: allergies, current medications, past medical history, past social history and past surgical history SHx: non smoker  Health Maintenance: Handout for mammogram and colonoscopy given. Pap smear done today.   Review of Systems See HPI    Objective:   Physical Exam BP 128/84  Pulse 88  Temp(Src) 98.1 F (36.7 C) (Oral)  Wt 195 lb  (88.451 kg) Gen: alert, cooperative, NAD Pelvic: normal external genitalia; normal vagina, normal cervix; no CMT; no adnexal masses     Assessment & Plan:

## 2013-08-12 NOTE — Assessment & Plan Note (Signed)
Increased Effexor to 75 mg twice a day.

## 2013-08-17 ENCOUNTER — Encounter: Payer: Self-pay | Admitting: Emergency Medicine

## 2013-08-17 LAB — CYTOLOGY - PAP

## 2013-09-16 ENCOUNTER — Encounter: Payer: Self-pay | Admitting: Family Medicine

## 2013-09-16 ENCOUNTER — Ambulatory Visit (INDEPENDENT_AMBULATORY_CARE_PROVIDER_SITE_OTHER): Payer: Medicaid Other | Admitting: Family Medicine

## 2013-09-16 VITALS — BP 115/80 | HR 77 | Temp 98.2°F | Ht 65.0 in | Wt 195.1 lb

## 2013-09-16 DIAGNOSIS — M7061 Trochanteric bursitis, right hip: Secondary | ICD-10-CM | POA: Insufficient documentation

## 2013-09-16 DIAGNOSIS — G5700 Lesion of sciatic nerve, unspecified lower limb: Secondary | ICD-10-CM

## 2013-09-16 DIAGNOSIS — G5702 Lesion of sciatic nerve, left lower limb: Secondary | ICD-10-CM

## 2013-09-16 MED ORDER — IBUPROFEN 800 MG PO TABS: 800.0000 mg | ORAL_TABLET | Freq: Three times a day (TID) | ORAL | Status: DC | PRN

## 2013-09-16 NOTE — Patient Instructions (Signed)
Great to meet you!  Remember to come back Monday or Tuesday if this is not getting better  Go straight to teh ED if the red flags below happen  Sciatica Sciatica is pain, weakness, numbness, or tingling along the path of the sciatic nerve. The nerve starts in the lower back and runs down the back of each leg. The nerve controls the muscles in the lower leg and in the back of the knee, while also providing sensation to the back of the thigh, lower leg, and the sole of your foot. Sciatica is a symptom of another medical condition. For instance, nerve damage or certain conditions, such as a herniated disk or bone spur on the spine, pinch or put pressure on the sciatic nerve. This causes the pain, weakness, or other sensations normally associated with sciatica. Generally, sciatica only affects one side of the body. CAUSES   Herniated or slipped disc.  Degenerative disk disease.  A pain disorder involving the narrow muscle in the buttocks (piriformis syndrome).  Pelvic injury or fracture.  Pregnancy.  Tumor (rare). SYMPTOMS  Symptoms can vary from mild to very severe. The symptoms usually travel from the low back to the buttocks and down the back of the leg. Symptoms can include:  Mild tingling or dull aches in the lower back, leg, or hip.  Numbness in the back of the calf or sole of the foot.  Burning sensations in the lower back, leg, or hip.  Sharp pains in the lower back, leg, or hip.  Leg weakness.  Severe back pain inhibiting movement. These symptoms may get worse with coughing, sneezing, laughing, or prolonged sitting or standing. Also, being overweight may worsen symptoms. DIAGNOSIS  Your caregiver will perform a physical exam to look for common symptoms of sciatica. He or she may ask you to do certain movements or activities that would trigger sciatic nerve pain. Other tests may be performed to find the cause of the sciatica. These may include:  Blood  tests.  X-rays.  Imaging tests, such as an MRI or CT scan. TREATMENT  Treatment is directed at the cause of the sciatic pain. Sometimes, treatment is not necessary and the pain and discomfort goes away on its own. If treatment is needed, your caregiver may suggest:  Over-the-counter medicines to relieve pain.  Prescription medicines, such as anti-inflammatory medicine, muscle relaxants, or narcotics.  Applying heat or ice to the painful area.  Steroid injections to lessen pain, irritation, and inflammation around the nerve.  Reducing activity during periods of pain.  Exercising and stretching to strengthen your abdomen and improve flexibility of your spine. Your caregiver may suggest losing weight if the extra weight makes the back pain worse.  Physical therapy.  Surgery to eliminate what is pressing or pinching the nerve, such as a bone spur or part of a herniated disk. HOME CARE INSTRUCTIONS   Only take over-the-counter or prescription medicines for pain or discomfort as directed by your caregiver.  Apply ice to the affected area for 20 minutes, 3-4 times a day for the first 48-72 hours. Then try heat in the same way.  Exercise, stretch, or perform your usual activities if these do not aggravate your pain.  Attend physical therapy sessions as directed by your caregiver.  Keep all follow-up appointments as directed by your caregiver.  Do not wear high heels or shoes that do not provide proper support.  Check your mattress to see if it is too soft. A firm mattress may lessen  your pain and discomfort. SEEK IMMEDIATE MEDICAL CARE IF:   You lose control of your bowel or bladder (incontinence).  You have increasing weakness in the lower back, pelvis, buttocks, or legs.  You have redness or swelling of your back.  You have a burning sensation when you urinate.  You have pain that gets worse when you lie down or awakens you at night.  Your pain is worse than you have  experienced in the past.  Your pain is lasting longer than 4 weeks.  You are suddenly losing weight without reason. MAKE SURE YOU:  Understand these instructions.  Will watch your condition.  Will get help right away if you are not doing well or get worse. Document Released: 02/11/2001 Document Revised: 08/19/2011 Document Reviewed: 06/29/2011 [facility] Patient Information 2015 [place], [place]. This information is not intended to replace advice given to you by your health care provider. Make sure you discuss any questions you have with your health care provider.

## 2013-09-16 NOTE — Assessment & Plan Note (Signed)
Patient with low back pain versus piriformis syndrome, after my exam and talking to her I think piriformis syndrome is the most likely etiology Treat with NSAIDs, ice, and stretches, handout given for piriformis syndrome from patient advisor. Discussed in detail and provided red flags for low back pain and she agrees to go the ER if this becomes to, she also agrees to come back if numbness or weakness becomes worse. MRI does not seem necessary this point as I think this is most likely perform syndrome Also has piriformis weakness on the opposite side, so encouraged the exercises as well as stretches. Followup as needed

## 2013-09-16 NOTE — Progress Notes (Signed)
Patient ID: Amanda Dickerson, female   DOB: Feb 22, 1961, 53 y.o.   MRN: [id number]  Kenn File, MD Phone: [phone]  Subjective:  Chief complaint-noted  Pt Here for left buttock pain  Patient states that she's been sternal with this for years but it's been much worse in the last 2 weeks. Chest are mouth and this is not typical of her general for her myalgia pains. She's taken 400 mg of ibuprofen with some improvement. She denies bowel or bladder dysfunction or saddle anesthesia She does have some minimal left leg weakness but can still walk is not limited in her function.  She also notes some posterior left leg numbness and subjective weakness.  She denies fever, chills, sweats, weight loss.  She does occasionally have left lower back pain that radiates down the back of her leg but this pain is primarily left buttock pain that radiates down the posterior left leg  ROS-  Per history of present illness  Past Medical History Patient Active Problem List   Diagnosis Date Noted  . Piriformis syndrome 09/16/2013  . Asthma, mild intermittent 10/25/2012  . Left ankle pain 07/30/2012  . Chest pain 01/19/2012  . Palpitations 12/26/2011  . Hot flashes 09/26/2011  . Fibromyalgia 07/18/2011  . Dizziness 07/18/2011  . Well woman exam with routine gynecological exam 07/18/2011  . Dysfunctional uterine bleeding     Medications- reviewed and updated Current Outpatient Prescriptions  Medication Sig Dispense Refill  . acetic acid-hydrocortisone (VOSOL-HC) otic solution Place 3 drops into both ears 3 (three) times daily. for 10 days  10 mL  0  . albuterol (PROVENTIL HFA;VENTOLIN HFA) 108 (90 BASE) MCG/ACT inhaler Inhale 2 puffs into the lungs every 6 (six) hours as needed for wheezing.  1 Inhaler  3  . cyclobenzaprine (FLEXERIL) 10 MG tablet Take 0.5 tablets (5 mg total) by mouth 3 (three) times daily as needed.  30 tablet  3  . ferrous sulfate 325 (65 FE) MG EC tablet Take 325 mg by  mouth daily with breakfast.      . ibuprofen (ADVIL,MOTRIN) 800 MG tablet Take 1 tablet (800 mg total) by mouth every 8 (eight) hours as needed.  30 tablet  0  . pantoprazole (PROTONIX) 40 MG tablet Take 1 tablet (40 mg total) by mouth daily.  30 tablet  3  . traZODone (DESYREL) 50 MG tablet Take 0.5-1 tablets (25-50 mg total) by mouth at bedtime as needed for sleep.  30 tablet  3  . venlafaxine (EFFEXOR) 75 MG tablet Take 1 tablet (75 mg total) by mouth 2 (two) times daily.  60 tablet  1   No current facility-administered medications for this visit.    Objective: BP 115/80  Pulse 77  Temp(Src) 98.2 F (36.8 C) (Oral)  Ht 5' 5"  (1.651 m)  Wt 195 lb 1.6 oz (88.497 kg)  BMI 32.47 kg/m2 Gen: NAD, alert, cooperative with exam HEENT: NCAT CV: RRR, good S1/S2, no murmur Resp: CTABL, no wheezes, non-labored Ext: No edema, warm Neuro: Alert and oriented, strength 4/5 in bilateral lower extremities with lower leg extension and plantar flexion, sensation intact bilateral lower extremities, normal gait, right-sided Trendelenburg sign (the opposite side pain)   Assessment/Plan:  Piriformis syndrome Patient with low back pain versus piriformis syndrome, after my exam and talking to her I think piriformis syndrome is the most likely etiology Treat with NSAIDs, ice, and stretches, handout given for piriformis syndrome from patient advisor. Discussed in detail and provided red flags for low  back pain and she agrees to go the ER if this becomes to, she also agrees to come back if numbness or weakness becomes worse. MRI does not seem necessary this point as I think this is most likely perform syndrome Also has piriformis weakness on the opposite side, so encouraged the exercises as well as stretches. Followup as needed    Meds ordered this encounter  Medications  . ibuprofen (ADVIL,MOTRIN) 800 MG tablet    Sig: Take 1 tablet (800 mg total) by mouth every 8 (eight) hours as needed.    Dispense:   30 tablet    Refill:  0

## 2013-10-03 ENCOUNTER — Ambulatory Visit (INDEPENDENT_AMBULATORY_CARE_PROVIDER_SITE_OTHER): Payer: Medicaid Other | Admitting: Family Medicine

## 2013-10-03 ENCOUNTER — Encounter: Payer: Self-pay | Admitting: Family Medicine

## 2013-10-03 VITALS — BP 114/74 | HR 60 | Temp 98.2°F | Ht 65.0 in | Wt 196.8 lb

## 2013-10-03 DIAGNOSIS — J45909 Unspecified asthma, uncomplicated: Secondary | ICD-10-CM

## 2013-10-03 DIAGNOSIS — J4520 Mild intermittent asthma, uncomplicated: Secondary | ICD-10-CM

## 2013-10-03 DIAGNOSIS — G5702 Lesion of sciatic nerve, left lower limb: Secondary | ICD-10-CM

## 2013-10-03 DIAGNOSIS — G5700 Lesion of sciatic nerve, unspecified lower limb: Secondary | ICD-10-CM

## 2013-10-03 DIAGNOSIS — K625 Hemorrhage of anus and rectum: Secondary | ICD-10-CM | POA: Insufficient documentation

## 2013-10-03 LAB — CBC
HEMATOCRIT: 37 % (ref 36.0–46.0)
Hemoglobin: 12.8 g/dL (ref 12.0–15.0)
MCH: 28.3 pg (ref 26.0–34.0)
MCHC: 34.6 g/dL (ref 30.0–36.0)
MCV: 81.7 fL (ref 78.0–100.0)
Platelets: 286 10*3/uL (ref 150–400)
RBC: 4.53 MIL/uL (ref 3.87–5.11)
RDW: 13.7 % (ref 11.5–15.5)
WBC: 4.6 10*3/uL (ref 4.0–10.5)

## 2013-10-03 MED ORDER — ALBUTEROL SULFATE HFA 108 (90 BASE) MCG/ACT IN AERS: 2.0000 | INHALATION_SPRAY | Freq: Four times a day (QID) | RESPIRATORY_TRACT | Status: DC | PRN

## 2013-10-03 MED ORDER — CYCLOBENZAPRINE HCL 10 MG PO TABS: 5.0000 mg | ORAL_TABLET | Freq: Three times a day (TID) | ORAL | Status: DC | PRN

## 2013-10-03 NOTE — Assessment & Plan Note (Signed)
Consistent with chronic LBP / piriformis syndrome  Plan: 1. Avoid NSAIDs in setting of current GI bleed 2. Refilled Flexeril 3. Continue conservative therapy, stretching 4. Referral to [facility], may need PT in future

## 2013-10-03 NOTE — Assessment & Plan Note (Signed)
Currently with reported persistent rectal bleeding (BRBPR) without melena, associated with abdominal pain / cramping - Denies hematemesis, but significant hx chronic NSAID use - Hx prior rectal bleeding 10 years ago, s/p reported EGD / Colonoscopy without significant findings  Plan: 1. Rectal exam today without gross blood or alternative etiology for bleeding 2. FOBT (positive) in clinic 3. CBC today - Hgb 12.8 (stable near b/l 12-14) 4. Referral to [facility] for repeat evaluation / follow-up, and likely need for repeat colonoscopy

## 2013-10-03 NOTE — Patient Instructions (Signed)
Dear Amanda Dickerson, Thank you for coming in to clinic today. It was good to see you!  Today we discussed your Hip Pain and Rectal Bleeding. 1. For your Hip Pain, it seems consistent with Piriformis Syndrome and Sciatica. 2. I refilled your Flexeril you may take half to 1 tablet up to 3 times daily. Continue with Tylenol as well for pain. 3. Please call [facility] to schedule follow-up appointment, I will send referral as well. [place], [place] [phone] 4. For your Rectal Bleeding, your fecal occult test was positive for blood in your stool. Given your history I recommend calling GI to schedule follow-up. I will send referral, they will discuss possibility of colonoscopy or not. 5. We will check your blood count today, and notify you if it is decreased, in that case we will send in some refills on your iron tablets. Please have the GI doctor send their results to our clinic.  Please schedule a follow-up appointment with me (Dr. Parks Ranger) in 2-3 months for follow-up  If you have any other questions or concerns, please feel free to call the clinic to contact me. You may also schedule an earlier appointment if necessary.  However, if your symptoms get significantly worse, please go to the Emergency Department to seek immediate medical attention.  Amanda Dickerson, [place]

## 2013-10-03 NOTE — Progress Notes (Signed)
Subjective:     Patient ID: Amanda Dickerson, female   DOB: 1961/02/10, 53 y.o.   MRN: [id number]  HPI  LBP / Piriformis syndrome - Currently reports that pain has improved some, but overall persistent - Taking Ibuprofen, now with some stomach cramps - chronic pain > 10 years, previously difficulties with ambulation / had used crutches - improved with valium, muscle relaxants - Prior PT, stretches, treadmill, intermittent - currently out of Flexeril - Denies fevers/chills, weakness, numbness, tingling, urinary/stool incontinence  Intermittent constipation / soft stools / blood in stool: - Reports initial constipation, since resolved s/p inc fiber intake, with some loose stools - 1 month ago with small bright red blood mixed into stool, occurred several times since initial episode - Denies dark stool or large volume or dripping blood - Abdominal cramps worse with BM, and improve after BM, now with recurrent BM and cramping - Denies hx hemorrhoids - Hx Colonoscopy and EGD (performed by [facility], about 10 yr ago due to prior GI bleed), with reported normal exam, attributed prior rectal bleeding episode to pain medicine at that time - Admits feeling weak and tired for past few days  I have reviewed and updated the following as appropriate: allergies and current medications  Social Hx: Never smoker  Review of Systems  See above HPI    Objective:   Physical Exam  BP 114/74  Pulse 60  Temp(Src) 98.2 F (36.8 C) (Oral)  Ht 5' 5"  (1.651 m)  Wt 196 lb 12.8 oz (89.268 kg)  BMI 32.75 kg/m2  Gen - well-appearing, NAD HEENT -  MMM Heart - RRR, no murmurs heard Lungs - CTAB. Normal work of breathing. Abd - soft, NTND, no masses, +active BS MSK - localized tenderness to palpation left lower back, negative SLR Ext - non-tender, no edema, peripheral pulses intact +2 b/l Skin - warm, dry, no rashes Neuro - awake, alert, oriented, grossly non-focal, intact muscle strength 5/5 b/l, intact  distal sensation to light touch, gait normal Rectal Exam - no external hemorrhoids or anal fissure, no palpable stool, mass, or internal hemorrhoids, tolerated exam well, no gross blood visualized, small light brown stool     Assessment:     See specific A&P problem list for details.      Plan:     See specific A&P problem list for details.

## 2013-10-04 ENCOUNTER — Telehealth: Payer: Self-pay | Admitting: Gastroenterology

## 2013-10-04 NOTE — Telephone Encounter (Signed)
Patient with 1 month of blood in the stool.  She was heme positive at her primary care yesterday.  She will come in and see Alonza Bogus, PA 10/12/13 2:00

## 2013-10-11 ENCOUNTER — Encounter: Payer: Self-pay | Admitting: Sports Medicine

## 2013-10-11 ENCOUNTER — Ambulatory Visit (INDEPENDENT_AMBULATORY_CARE_PROVIDER_SITE_OTHER): Payer: Medicaid Other | Admitting: Sports Medicine

## 2013-10-11 VITALS — BP 112/78 | Ht 65.0 in | Wt 195.0 lb

## 2013-10-11 DIAGNOSIS — G5702 Lesion of sciatic nerve, left lower limb: Secondary | ICD-10-CM

## 2013-10-11 DIAGNOSIS — G5700 Lesion of sciatic nerve, unspecified lower limb: Secondary | ICD-10-CM

## 2013-10-11 MED ORDER — METHYLPREDNISOLONE ACETATE 40 MG/ML IJ SUSP
40.0000 mg | Freq: Once | INTRAMUSCULAR | Status: AC
  Administered 2013-10-11: 40 mg via INTRA_ARTICULAR

## 2013-10-11 NOTE — Assessment & Plan Note (Signed)
-  Exam most consistent with greater trochanteric bursitis. There may be co-existing piriformis muscle spasm as well. No findings consistent with significant hip arthritis or sciatica. -Patient tolerated the greater trochanteric injection well with good relief of symptoms. -She was given and I explained home stretches and gluteal medius/hip abductor strengthening exercises. -We will see how she responds to this intervention. -Follow-up in 4-6 weeks if symptoms persist or soon if acutely worsened. Consider ultrasound at that time or further imaging if indicated.

## 2013-10-11 NOTE — Progress Notes (Signed)
Subjective:    Patient ID: Amanda Dickerson, female    DOB: 1960/03/31, 53 y.o.   MRN: [id number]  HPI  [name] is a 53 year old female who presents with left hip pain at the referral of her primary care physician Dr. Parks Ranger. Onset of symptoms was several years ago, without any known acute injury. She notes chronic intermittent left hip pain that is located lateral and posterior tear greater trochanter. The pain will occasionally radiate down the back of her leg to her foot. She denies any significant low back pain, weakness, numbness, tingling, fevers, or chills. She denies any deep groin pain, hip locking, or popping. She has tried muscle relaxers, stretches, and physical therapy with little relief of symptoms. Laying on her left side at night aggravated her pain, as well as walking. Her condition is complicated by recent concern for possible GI bleed, which is being worked up by a gastroenterologist, which has prevented her from being able to take any anti-inflammatory medications.  Review of Systems  Pertinent positives: Left lateral hip pain Pertinent negatives: Numbness, tingling, weakness, fevers, chills, deep groin pain, low back pain 11 point review of systems was performed is otherwise negative  Past medical history:  Past Medical History  Diagnosis Date  . Asthma   . Headache(784.0)   . Fibromyalgia   . Dysfunctional uterine bleeding   . Fibromyalgia   . Anxiety    Past surgical history:  Past Surgical History  Procedure Laterality Date  . Cesarean section  1988  . Tubal ligation  2000  . Multiple tooth extractions    . Endometrial ablation w/ novasure  08/2011   Social history:  History   Social History  . Marital Status: Legally Separated    Spouse Name: N/A    Number of Children: N/A  . Years of Education: N/A   Occupational History  . Not on file.   Social History Main Topics  . Smoking status: Never Smoker   . Smokeless tobacco: Never Used  . Alcohol  Use: Yes     Comment: occasionally  . Drug Use: No  . Sexual Activity: No   Other Topics Concern  . Not on file   Social History Narrative  . No narrative on file       Objective:   Physical Exam BP 112/78  Ht 5' 5"  (1.651 m)  Wt 195 lb (88.451 kg)  BMI 32.45 kg/m2 GEN: The patient is well-developed well-nourished female and in no acute distress.  She is awake alert and oriented x3. SKIN: warm and well-perfused, no rash  EXTR: No lower extremity edema or calf tenderness Neuro: Strength 5/5 globally. Sensation intact throughout. DTRs 2/4 bilaterally. No focal deficits. Negative straight leg raise test bilaterally. Vasc: +2 bilateral distal pulses. No edema.  MSK: Examination of the left hip reveals full internal and external range of motion without pain. Tenderness is elicited with palpation over the left lateral posterior greater trochanter. She has mild tenderness at the piriformis as well. No bony tenderness in her low back. No quadriceps atrophy. Tenderness is also elicited with palpation over the lateral IT band. Negative grind test of the hip. Pain is also reproduced with FADIR test. She has a mildly positive Trendelenburg left greater than right with gait analysis.  Procedure: We discussed the patient's diagnosis and treatment options including rest, anti-inflammatories, physical therapy, and therapeutic injection of the greater trochanter bursa. The patient elected to try bursal injection at the left greater trochanter. Risks and  benefits were explained and all questions answered. Informed consent was signed by the patient. Timeout performed. The site was identified through manual palpation and confirmation of the site of maximal tenderness. The area was marked and cleaned with an alcohol wipe. Using a 25-gauge needle, methylprednisolone 60m in 4cc's of 1% plain lidocaine was injected into the greater trochanteric bursa. Hemostasis was spontaneously achieved and a band-aid was  placed at the site. The patient tolerated the procedure well and there were no complications. The patient reported improvement of pain following the procedure. We discussed further therapies including, icing, exercises, stretches, and anti-inflammatory medications. If the patient notices increasing redness, pain, fever, chills, myalgias, or site bleeding, we instructed her to call the clinic immediately or go to the ED. She verbalized understanding and agreement.      Assessment & Plan:

## 2013-10-12 ENCOUNTER — Ambulatory Visit (INDEPENDENT_AMBULATORY_CARE_PROVIDER_SITE_OTHER): Payer: Medicaid Other | Admitting: Gastroenterology

## 2013-10-12 ENCOUNTER — Telehealth: Payer: Self-pay | Admitting: Gastroenterology

## 2013-10-12 ENCOUNTER — Encounter: Payer: Self-pay | Admitting: Gastroenterology

## 2013-10-12 VITALS — BP 110/68 | HR 72 | Ht 65.0 in | Wt 197.2 lb

## 2013-10-12 DIAGNOSIS — R198 Other specified symptoms and signs involving the digestive system and abdomen: Secondary | ICD-10-CM

## 2013-10-12 DIAGNOSIS — R194 Change in bowel habit: Secondary | ICD-10-CM | POA: Insufficient documentation

## 2013-10-12 DIAGNOSIS — K625 Hemorrhage of anus and rectum: Secondary | ICD-10-CM

## 2013-10-12 MED ORDER — MOVIPREP 100 G PO SOLR: 1.0000 | Freq: Once | ORAL | Status: DC

## 2013-10-12 MED ORDER — HYDROCORTISONE ACETATE 25 MG RE SUPP: 25.0000 mg | Freq: Two times a day (BID) | RECTAL | Status: DC

## 2013-10-12 MED ORDER — HYDROCORTISONE 0.5 % EX OINT: 1.0000 "application " | TOPICAL_OINTMENT | Freq: Two times a day (BID) | CUTANEOUS | Status: DC

## 2013-10-12 NOTE — Telephone Encounter (Signed)
Try proctocort, which are also suppositories.  If those are not covered then can just give hydrocortisone cream and will need to apply in the anal canal with glove.  Thank you,  Jess

## 2013-10-12 NOTE — Progress Notes (Signed)
10/12/2013 Amanda Dickerson [id number] [birth year]   HISTORY OF PRESENT ILLNESS:  This is a pleasant 53 year old female who is previously known to Dr. Fuller Plan for colonoscopy in 2007, which was reportedly normal since she was due for a recall in 2017, however, I do not have that report available to me currently.  She presents to our office today with complaints of rectal bleeding described as bright red blood on the TP and in the toilet bowl with BM's for the past 3-4 months.  Her bowel movements have changed as well and goes back and forth between constipation and diarrhea.  Sometimes she will have up to 4 BM's in a day and other times she will not have a BM for a couple of days.  Recent CBC was normal including Hgb of 12.8 grams.  TSH in 04/2013 was normal.  She got some type of fiber supplement (either Benefiber or Metamucil) but has not been very good at taking it regularly.  Gets some abdominal cramping that improves with BM's.   Past Medical History  Diagnosis Date  . Asthma   . Headache(784.0)   . Fibromyalgia   . Dysfunctional uterine bleeding   . Anxiety    Past Surgical History  Procedure Laterality Date  . Cesarean section  1988  . Tubal ligation  2000  . Multiple tooth extractions    . Endometrial ablation w/ novasure  08/2011    reports that she has never smoked. She has never used smokeless tobacco. She reports that she drinks alcohol. She reports that she does not use illicit drugs. family history includes Diabetes in her father; Heart disease in her mother; Hyperlipidemia in her mother; Hypertension in her mother; Prostate cancer in her father. Allergies  Allergen Reactions  . Ibuprofen Other (See Comments)    Upsets stomach  . Penicillins Nausea And Vomiting      Outpatient Encounter Prescriptions as of 10/12/2013  Medication Sig  . albuterol (PROVENTIL HFA;VENTOLIN HFA) 108 (90 BASE) MCG/ACT inhaler Inhale 2 puffs into the lungs every 6 (six) hours as needed for  wheezing.  . cyclobenzaprine (FLEXERIL) 10 MG tablet Take 0.5-1 tablets (5-10 mg total) by mouth 3 (three) times daily as needed.  . traZODone (DESYREL) 50 MG tablet Take 0.5-1 tablets (25-50 mg total) by mouth at bedtime as needed for sleep.  Marland Kitchen venlafaxine (EFFEXOR) 75 MG tablet Take 1 tablet (75 mg total) by mouth 2 (two) times daily.  . [DISCONTINUED] acetic acid-hydrocortisone (VOSOL-HC) otic solution Place 3 drops into both ears 3 (three) times daily. for 10 days  . [DISCONTINUED] ferrous sulfate 325 (65 FE) MG EC tablet Take 325 mg by mouth daily with breakfast.  . [DISCONTINUED] ibuprofen (ADVIL,MOTRIN) 800 MG tablet Take 1 tablet (800 mg total) by mouth every 8 (eight) hours as needed.  . [DISCONTINUED] pantoprazole (PROTONIX) 40 MG tablet Take 1 tablet (40 mg total) by mouth daily.     REVIEW OF SYSTEMS  : All other systems reviewed and negative except where noted in the History of Present Illness.   PHYSICAL EXAM: BP 110/68  Pulse 72  Ht 5' 5"  (1.651 m)  Wt 197 lb 4 oz (89.472 kg)  BMI 32.82 kg/m2 General: Well developed female in no acute distress Head: Normocephalic and atraumatic Eyes:  Sclerae anicteric, conjunctiva pink. Ears: Normal auditory acuity Lungs: Clear throughout to auscultation Heart: Regular rate and rhythm Abdomen: Soft, non-distended.  Normal bowel sounds.  Non-tender. Rectal:  Will be done  at the time of colonoscopy. Musculoskeletal: Symmetrical with no gross deformities  Skin: No lesions on visible extremities Extremities: No edema  Neurological: Alert oriented x 4, grossly non-focal Psychological:  Alert and cooperative. Normal mood and affect  ASSESSMENT AND PLAN: -Rectal bleeding and change in bowel habits alternating with constipation and diarrhea:  Rectal bleeding likely outlet bleeding due to erratic BMs, but will schedule colonoscopy with Dr. Fuller Plan for further evaluation.  The risks, benefits, and alternatives were discussed with the patient  and she consents to proceed.  Will give her hydrocortisone suppositories to use BID for 10 days in the interim.  Will try taking her fiber supplement such as Benefiber or Metamucil on a daily basis.

## 2013-10-12 NOTE — Patient Instructions (Addendum)
You have been scheduled for a colonoscopy with Dr.Stark. Please follow written instructions given to you at your visit today.  Please pick up your prep kit at the pharmacy within the next 1-3 days. If you use inhalers (even only as needed), please bring them with you on the day of your procedure. Your physician has requested that you go to www.startemmi.com and enter the access code given to you at your visit today. This web site gives a general overview about your procedure. However, you should still follow specific instructions given to you by our office regarding your preparation for the procedure.  We have sent the following medications to your pharmacy for you to pick up at your convenience: Anusol Suppositories, please place one suppository rectally twice daily for ten days   Please start a fiber supplement over the counter Examples of what you can take are: Metamucil Benefiber

## 2013-10-12 NOTE — Telephone Encounter (Signed)
SENT HYDROCORTISONE NOTIFIED PATIENT TO PUT SOME INTERNALLY AND EXTERNALLY TWICE DAILY  PATIENT VERBALIZED UNDERSTANDING

## 2013-10-13 NOTE — Progress Notes (Signed)
Reviewed and agree with management plan.  Makaio Mach T. Gardenia Witter, MD FACG 

## 2013-10-17 ENCOUNTER — Ambulatory Visit (INDEPENDENT_AMBULATORY_CARE_PROVIDER_SITE_OTHER): Payer: Medicaid Other | Admitting: Family Medicine

## 2013-10-17 ENCOUNTER — Encounter: Payer: Self-pay | Admitting: Family Medicine

## 2013-10-17 VITALS — BP 116/74 | HR 64 | Temp 98.8°F | Ht 65.0 in | Wt 196.0 lb

## 2013-10-17 DIAGNOSIS — H9203 Otalgia, bilateral: Secondary | ICD-10-CM | POA: Insufficient documentation

## 2013-10-17 DIAGNOSIS — H9209 Otalgia, unspecified ear: Secondary | ICD-10-CM

## 2013-10-17 MED ORDER — NEOMYCIN-POLYMYXIN-HC 3.5-10000-1 OT SOLN: 4.0000 [drp] | Freq: Three times a day (TID) | OTIC | Status: DC

## 2013-10-17 NOTE — Patient Instructions (Signed)
It was nice to see you today.  I am prescribing antibiotic drops and placing a referral to ENT.  Follow up closely with your PCP.

## 2013-10-17 NOTE — Assessment & Plan Note (Signed)
R>L with report decreased hearing in R ear. Right canal mildly erythematous.  Will treat with Cortisporin Otic. Given duration of symptoms (over one year), will refer to ENT for further evaluation.

## 2013-10-17 NOTE — Progress Notes (Signed)
   Subjective:    Patient ID: Amanda Dickerson, female    DOB: 1960/11/29, 53 y.o.   MRN: [id number]  HPI 53 year old female presents for same day appointment with complaints of otalgia.  1) Ear pain - Patient reports she has been experiencing bilateral ear pain for over a year. - She states that she has been treated for "swimmer's ear" a few times with some improvement in her pain. - This morning, she woke up and had difficulty hearing out of her right ear.  She continues to have pain in both ears, but right greater than left. - She notes associated ear drainage. No recent fevers, chills, cough.  No sinus congestion or pain.  She does note associated dizziness.  Review of Systems Per HPI    Objective:   Physical Exam Filed Vitals:   10/17/13 0920  BP: 116/74  Pulse: 64  Temp: 98.8 F (37.1 C)  Exam: General: well appearing female in NAD. HEENT: Ears: TM's - normal bilaterally.  Right canal mildly erythematous.  No drainage noted. Otherwise ear examination unremarkable.    Assessment & Plan:  See Problem List

## 2013-10-27 ENCOUNTER — Encounter: Payer: Self-pay | Admitting: Gastroenterology

## 2013-10-27 ENCOUNTER — Ambulatory Visit (AMBULATORY_SURGERY_CENTER): Payer: Medicaid Other | Admitting: Gastroenterology

## 2013-10-27 ENCOUNTER — Other Ambulatory Visit: Payer: Self-pay | Admitting: Gastroenterology

## 2013-10-27 VITALS — BP 126/78 | HR 51 | Temp 97.8°F | Resp 30 | Ht 65.0 in | Wt 197.0 lb

## 2013-10-27 DIAGNOSIS — K921 Melena: Secondary | ICD-10-CM

## 2013-10-27 DIAGNOSIS — R198 Other specified symptoms and signs involving the digestive system and abdomen: Secondary | ICD-10-CM

## 2013-10-27 DIAGNOSIS — K5289 Other specified noninfective gastroenteritis and colitis: Secondary | ICD-10-CM

## 2013-10-27 DIAGNOSIS — K625 Hemorrhage of anus and rectum: Secondary | ICD-10-CM

## 2013-10-27 DIAGNOSIS — R194 Change in bowel habit: Secondary | ICD-10-CM

## 2013-10-27 MED ORDER — SODIUM CHLORIDE 0.9 % IV SOLN: 500.0000 mL | INTRAVENOUS | Status: DC

## 2013-10-27 NOTE — Op Note (Signed)
[place]  [address]. [place], [id number]   COLONOSCOPY PROCEDURE REPORT  PATIENT: Amanda, Dickerson  MR#: [id number] BIRTHDATE: 05/15/1960 , 53  yrs. old GENDER: Female ENDOSCOPIST: Ladene Artist, MD, [facility] REFERRED BY: [name], MD PROCEDURE DATE:  10/27/2013 PROCEDURE:   Colonoscopy with biopsy First Screening Colonoscopy - Avg.  risk and is 50 yrs.  old or older - No.  Prior Negative Screening - Now for repeat screening. N/A  History of Adenoma - Now for follow-up colonoscopy & has been > or = to 3 yrs.  N/A  Polyps Removed Today? No.  Recommend repeat exam, <10 yrs? No. ASA CLASS:   Class II INDICATIONS:hematochezia and Change in bowel habits. MEDICATIONS: MAC sedation, administered by CRNA and propofol (Diprivan) 271m IV DESCRIPTION OF PROCEDURE:   After the risks benefits and alternatives of the procedure were thoroughly explained, informed consent was obtained.  A digital rectal exam revealed no abnormalities of the rectum.   The LB [id number] endoscope was introduced through the anus and advanced to the cecum, which was identified by both the appendix and ileocecal valve. No adverse events experienced.   The quality of the prep was good, using MoviPrep  The instrument was then slowly withdrawn as the colon was fully examined.  COLON FINDINGS: Abnormal mucosa was found in the sigmoid colon and rectum.  The mucosa had pathcy erythematous and friable mucosa. Multiple biopsies of the area were performed.   The colon was otherwise normal.  There was no diverticulosis, inflammation, polyps or cancers unless previously stated.  Retroflexed views revealed small internal hemorrhoids. The time to cecum=1 minutes 33 seconds.  Withdrawal time=10 minutes 08 seconds.  The scope was withdrawn and the procedure completed. COMPLICATIONS: There were no complications.  ENDOSCOPIC IMPRESSION: 1.   Abnormal mucosa in the sigmoid colon and  rectum; polypectomy performed; multiple biopsies performed 2.   Small internal hemorrhoids  RECOMMENDATIONS: 1. Await pathology results  eSigned:  MLadene Artist MD, FGoleta Valley Cottage Hospital08/27/2015 11:51 AM

## 2013-10-27 NOTE — Progress Notes (Signed)
Called to room to assist during endoscopic procedure.  Patient ID and intended procedure confirmed with present staff. Received instructions for my participation in the procedure from the performing physician.  

## 2013-10-27 NOTE — Progress Notes (Signed)
A/ox3 pleased with MAC, report to Celia RN 

## 2013-10-27 NOTE — Patient Instructions (Signed)
YOU HAD AN ENDOSCOPIC PROCEDURE TODAY AT [facility]: Refer to the procedure report that was given to you for any specific questions about what was found during the examination.  If the procedure report does not answer your questions, please call your gastroenterologist to clarify.  If you requested that your care partner not be given the details of your procedure findings, then the procedure report has been included in a sealed envelope for you to review at your convenience later.  YOU SHOULD EXPECT: Some feelings of bloating in the abdomen. Passage of more gas than usual.  Walking can help get rid of the air that was put into your GI tract during the procedure and reduce the bloating. If you had a lower endoscopy (such as a colonoscopy or flexible sigmoidoscopy) you may notice spotting of blood in your stool or on the toilet paper. If you underwent a bowel prep for your procedure, then you may not have a normal bowel movement for a few days.  DIET: Your first meal following the procedure should be a light meal and then it is ok to progress to your normal diet.  A half-sandwich or bowl of soup is an example of a good first meal.  Heavy or fried foods are harder to digest and may make you feel nauseous or bloated.  Likewise meals heavy in dairy and vegetables can cause extra gas to form and this can also increase the bloating.  Drink plenty of fluids but you should avoid alcoholic beverages for 24 hours.  ACTIVITY: Your care partner should take you home directly after the procedure.  You should plan to take it easy, moving slowly for the rest of the day.  You can resume normal activity the day after the procedure however you should NOT DRIVE or use heavy machinery for 24 hours (because of the sedation medicines used during the test).    SYMPTOMS TO REPORT IMMEDIATELY: A gastroenterologist can be reached at any hour.  During normal business hours, 8:30 AM to 5:00 PM Monday through Friday,  call [phone].  After hours and on weekends, please call the GI answering service at [phone] who will take a message and have the physician on call contact you.   Following lower endoscopy (colonoscopy or flexible sigmoidoscopy):  Excessive amounts of blood in the stool  Significant tenderness or worsening of abdominal pains  Swelling of the abdomen that is new, acute  Fever of 100F or higher  Following upper endoscopy (EGD)  Vomiting of blood or coffee ground material  New chest pain or pain under the shoulder blades  Painful or persistently difficult swallowing  New shortness of breath  Fever of 100F or higher  Black, tarry-looking stools  FOLLOW UP: If any biopsies were taken you will be contacted by phone or by letter within the next 1-3 weeks.  Call your gastroenterologist if you have not heard about the biopsies in 3 weeks.  Our staff will call the home number listed on your records the next business day following your procedure to check on you and address any questions or concerns that you may have at that time regarding the information given to you following your procedure. This is a courtesy call and so if there is no answer at the home number and we have not heard from you through the emergency physician on call, we will assume that you have returned to your regular daily activities without incident.  SIGNATURES/CONFIDENTIALITY: You and/or your care  partner have signed paperwork which will be entered into your electronic medical record.  These signatures attest to the fact that that the information above on your After Visit Summary has been reviewed and is understood.  Full responsibility of the confidentiality of this discharge information lies with you and/or your care-partner.  Information on hemorrhoids given to you today  Await biopsy results

## 2013-10-28 ENCOUNTER — Telehealth: Payer: Self-pay | Admitting: *Deleted

## 2013-10-28 NOTE — Telephone Encounter (Signed)
  Follow up Call-  Call back number 10/27/2013  Post procedure Call Back phone  # [phone]  Permission to leave phone message Yes     Patient questions:  Do you have a fever, pain , or abdominal swelling? No. Pain Score  0 *  Have you tolerated food without any problems? Yes.    Have you been able to return to your normal activities? Yes.    Do you have any questions about your discharge instructions: Diet   No. Medications  No. Follow up visit  No.  Do you have questions or concerns about your Care? No.  Actions: * If pain score is 4 or above: No action needed, pain <4.

## 2013-11-03 ENCOUNTER — Encounter: Payer: Self-pay | Admitting: Gastroenterology

## 2013-11-03 ENCOUNTER — Telehealth: Payer: Self-pay

## 2013-11-03 NOTE — Telephone Encounter (Signed)
Patient notified.  She will come in on 11/15/13 3:45.

## 2013-11-03 NOTE — Telephone Encounter (Signed)
Message copied by Marlon Pel on Thu Nov 03, 2013  1:39 PM ------      Message from: Lucio Edward T      Created: Thu Nov 03, 2013 12:38 PM       See path and path letter. Please schedule REV with me soon for her.  ------

## 2013-11-08 ENCOUNTER — Encounter: Payer: Self-pay | Admitting: Family Medicine

## 2013-11-08 ENCOUNTER — Ambulatory Visit (INDEPENDENT_AMBULATORY_CARE_PROVIDER_SITE_OTHER): Payer: Medicaid Other | Admitting: Family Medicine

## 2013-11-08 VITALS — BP 115/63 | HR 66 | Temp 98.3°F | Ht 65.0 in | Wt 194.0 lb

## 2013-11-08 DIAGNOSIS — G5700 Lesion of sciatic nerve, unspecified lower limb: Secondary | ICD-10-CM

## 2013-11-08 DIAGNOSIS — G5701 Lesion of sciatic nerve, right lower limb: Secondary | ICD-10-CM | POA: Insufficient documentation

## 2013-11-08 MED ORDER — MELOXICAM 15 MG PO TABS: 15.0000 mg | ORAL_TABLET | Freq: Every day | ORAL | Status: DC

## 2013-11-08 NOTE — Progress Notes (Signed)
Amanda Dickerson is a 53 y.o. female who presents today for lateral hip pain/back pain.  Right Low Back Pain/Hip Pain - Pt states this pain has been there for about 5-6 days now.  Located around the right lateral "tailbone" region w/o associated paresthesias, weakness, dysesthesias.  Pain is worse with movement, especially leg cross over, forward flexion and adduction/internal rotation.  Denies inciting injury and states she has never had this pain before.  Denies bowel/bladder problems, weakness in her legs.  Pain is not at rest but if tries to walk, will cause her problems.  She has taken flexeril, which has helped, but otherwise has not done too much.   Past Medical History  Diagnosis Date  . Asthma   . Headache(784.0)   . Fibromyalgia   . Dysfunctional uterine bleeding   . Anxiety   . Anemia   . Arthritis     KNEES  . GERD (gastroesophageal reflux disease)   . Crohn's colitis     History  Smoking status  . Never Smoker   Smokeless tobacco  . Never Used    Family History  Problem Relation Age of Onset  . Hypertension Mother   . Heart disease Mother   . Hyperlipidemia Mother   . Diabetes Father   . Prostate cancer Father     Current Outpatient Prescriptions on File Prior to Visit  Medication Sig Dispense Refill  . albuterol (PROVENTIL HFA;VENTOLIN HFA) 108 (90 BASE) MCG/ACT inhaler Inhale 2 puffs into the lungs every 6 (six) hours as needed for wheezing.  1 Inhaler  3  . cyclobenzaprine (FLEXERIL) 10 MG tablet Take 0.5-1 tablets (5-10 mg total) by mouth 3 (three) times daily as needed.  30 tablet  3  . hydrocortisone ointment 0.5 % Apply 1 application topically 2 (two) times daily.  30 g  0  . MOVIPREP 100 G SOLR Take 1 kit (200 g total) by mouth once.  1 kit  0  . neomycin-polymyxin-hydrocortisone (CORTISPORIN) otic solution Place 4 drops into the right ear 3 (three) times daily.  10 mL  0  . traZODone (DESYREL) 50 MG tablet Take 0.5-1 tablets (25-50 mg total) by mouth at  bedtime as needed for sleep.  30 tablet  3  . venlafaxine (EFFEXOR) 75 MG tablet Take 1 tablet (75 mg total) by mouth 2 (two) times daily.  60 tablet  1   No current facility-administered medications on file prior to visit.    ROS: Per HPI.  All other systems reviewed and are negative.   Physical Exam Filed Vitals:   11/08/13 1033  BP: 115/63  Pulse: 66  Temp: 98.3 F (36.8 C)    Physical Examination: General appearance - alert, well appearing, and in no distress MSK : R hip/back: No visual deformities, ecchymosis, muscle atrophy TTP around the posterior greater trochanter/gluteus region ROM limited in flexion/internal rotation/adduction of the R hip. MS 5/5 RLE, DTR 2/4 RLE, vascular intact distally. Trendelenburg Negative on the R side Freiberg test + on the R side

## 2013-11-08 NOTE — Assessment & Plan Note (Signed)
Recommend conservative management with Tylenol 650 mg TID, mobic qd (states she has upset stomach with ibuprofen and willing to try mobic), flexeril as needed.  Piriformis exercises given to pt, to do for 30 seconds per stretch, 3-4 x per day.  Considering depo 1 to lidocaine 2-3 injection into trigger point of piriformis if does not improve.  Risks of hematoma, nerve injury (both short/long term) and infection explained to pt today and will consider if no improvement on conservative regimen.

## 2013-11-08 NOTE — Patient Instructions (Signed)
Please use Tylenol 650 mg three to four times per day. Please use the Mobic 15 mg once per day in the morning. Please do the exercises four times per day, holding for at least 30 seconds.  Thanks, Dr. Awanda Mink

## 2013-11-15 ENCOUNTER — Encounter: Payer: Self-pay | Admitting: Gastroenterology

## 2013-11-15 ENCOUNTER — Ambulatory Visit (INDEPENDENT_AMBULATORY_CARE_PROVIDER_SITE_OTHER): Payer: Medicaid Other | Admitting: Gastroenterology

## 2013-11-15 VITALS — BP 120/74 | HR 60 | Ht 65.0 in | Wt 195.4 lb

## 2013-11-15 DIAGNOSIS — K5289 Other specified noninfective gastroenteritis and colitis: Secondary | ICD-10-CM

## 2013-11-15 NOTE — Patient Instructions (Signed)
Please return to your primary care doctor as discussed with Dr. Fuller Plan.  Please follow up as needed

## 2013-11-15 NOTE — Progress Notes (Signed)
    History of Present Illness: This is a 53 year old female who underwent colonoscopy in late August for small volume rectal bleeding and a change in bowel habits. A mild patchy colitis was noted in the rectum and sigmoid colon. Biopsy results as outlined below. Since then her bowel habits have returned to her normal bowel habits which is mild constipation. She has not noted any rectal bleeding. She has occasional reflux symptoms which are controlled with omeprazole.  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 - THERE IS NO EVIDENCE OF DYSPLASIA OR MALIGNANCY.  Current Medications, Allergies, Past Medical History, Past Surgical History, Family History and Social History were reviewed in [employer] record.  Physical Exam: General: Well developed , well nourished, no acute distress Head: Normocephalic and atraumatic Eyes:  sclerae anicteric, EOMI Ears: Normal auditory acuity Mouth: No deformity or lesions Lungs: Clear throughout to auscultation Heart: Regular rate and rhythm; no murmurs, rubs or bruits Abdomen: Soft, non tender and non distended. No masses, hepatosplenomegaly or hernias noted. Normal Bowel sounds Musculoskeletal: Symmetrical with no gross deformities  Pulses:  Normal pulses noted Extremities: No clubbing, cyanosis, edema or deformities noted Neurological: Alert oriented x 4, grossly nonfocal Psychological:  Alert and cooperative. Normal mood and affect  Assessment and Recommendations:  1. Mild proctosigmoiditis. Endoscopic features nonspecific are biopsies suggestive of Crohn's however clinically the colitis has resolved. This may have been a self-limited proctosigmoiditis. She is advised to look for problems with abdominal pain, diarrhea and rectal bleeding and return if these occur.  2. Mild GERD. Continue omeprazole 20 mg daily as needed and antireflux measures. Followup  with PCP.  3. Mild constipation. MiraLax daily as needed. Followup with PCP.

## 2013-11-29 ENCOUNTER — Telehealth: Payer: Self-pay | Admitting: Family Medicine

## 2013-11-29 DIAGNOSIS — K219 Gastro-esophageal reflux disease without esophagitis: Secondary | ICD-10-CM

## 2013-11-29 MED ORDER — OMEPRAZOLE 20 MG PO CPDR: 20.0000 mg | DELAYED_RELEASE_CAPSULE | Freq: Every day | ORAL | Status: DC

## 2013-11-29 MED ORDER — VENLAFAXINE HCL 75 MG PO TABS: 75.0000 mg | ORAL_TABLET | Freq: Two times a day (BID) | ORAL | Status: DC

## 2013-11-29 NOTE — Telephone Encounter (Signed)
Needs refill on venlafaxime and omeperzole  [facility]

## 2013-11-29 NOTE — Telephone Encounter (Signed)
Reviewed chart, refilled Venlafaxine 20m BID, and sent new rx for Omeprazole 220mdaily  AlNobie PutnamDOBelspringPGY-2

## 2013-12-02 ENCOUNTER — Encounter (HOSPITAL_COMMUNITY): Payer: Self-pay | Admitting: Emergency Medicine

## 2013-12-02 ENCOUNTER — Emergency Department (HOSPITAL_COMMUNITY)
Admission: EM | Admit: 2013-12-02 | Discharge: 2013-12-02 | Disposition: A | Discharge status: Home / Self Care | Payer: Medicaid Other | Source: Home / Self Care | Attending: Family Medicine | Admitting: Family Medicine

## 2013-12-02 DIAGNOSIS — N390 Urinary tract infection, site not specified: Secondary | ICD-10-CM

## 2013-12-02 LAB — POCT URINALYSIS DIP (DEVICE)
BILIRUBIN URINE: NEGATIVE
GLUCOSE, UA: NEGATIVE mg/dL
Ketones, ur: NEGATIVE mg/dL
NITRITE: NEGATIVE
Protein, ur: NEGATIVE mg/dL
Specific Gravity, Urine: 1.02 (ref 1.005–1.030)
UROBILINOGEN UA: 0.2 mg/dL (ref 0.0–1.0)
pH: 5 (ref 5.0–8.0)

## 2013-12-02 MED ORDER — CEPHALEXIN 500 MG PO CAPS: 500.0000 mg | ORAL_CAPSULE | Freq: Four times a day (QID) | ORAL | Status: DC

## 2013-12-02 NOTE — Discharge Instructions (Signed)
Take all of medicine as directed, drink lots of fluids, see your doctor if further problems. °

## 2013-12-02 NOTE — ED Provider Notes (Signed)
CSN: [id number]     Arrival date & time 12/02/13  3244 History   First MD Initiated Contact with Patient 12/02/13 [phone]     Chief Complaint  Patient presents with  . Urinary Tract Infection   (Consider location/radiation/quality/duration/timing/severity/associated sxs/prior Treatment) Patient is a 53 y.o. female presenting with dysuria. The history is provided by the patient.  Dysuria Pain quality:  Burning Pain severity:  Mild Onset quality:  Gradual Duration:  3 days Chronicity:  New Recent urinary tract infections: no   Relieved by:  None tried Worsened by:  Nothing tried Associated symptoms: no fever, no flank pain, no nausea and no vomiting   Risk factors: no hx of pyelonephritis and no recurrent urinary tract infections     Past Medical History  Diagnosis Date  . Asthma   . Headache(784.0)   . Fibromyalgia   . Dysfunctional uterine bleeding   . Anxiety   . Anemia   . Arthritis     KNEES  . GERD (gastroesophageal reflux disease)   . Crohn's colitis    Past Surgical History  Procedure Laterality Date  . Cesarean section  1988  . Tubal ligation  2000  . Multiple tooth extractions    . Endometrial ablation w/ novasure  08/2011  . Colonoscopy     Family History  Problem Relation Age of Onset  . Hypertension Mother   . Heart disease Mother   . Hyperlipidemia Mother   . Diabetes Father   . Prostate cancer Father    History  Substance Use Topics  . Smoking status: Never Smoker   . Smokeless tobacco: Never Used  . Alcohol Use: Yes     Comment: occasionally   OB History   Grav Para Term Preterm Abortions TAB SAB Ect Mult Living   1 1 1       1      Review of Systems  Constitutional: Negative for fever.  Gastrointestinal: Negative.  Negative for nausea and vomiting.  Genitourinary: Positive for dysuria, urgency and frequency. Negative for hematuria, flank pain, menstrual problem and pelvic pain.    Allergies  Ibuprofen and Penicillins  Home Medications    Prior to Admission medications   Medication Sig Start Date End Date Taking? Authorizing Provider  albuterol (PROVENTIL HFA;VENTOLIN HFA) 108 (90 BASE) MCG/ACT inhaler Inhale 2 puffs into the lungs every 6 (six) hours as needed for wheezing. 10/03/13   [name], DO  cephALEXin (KEFLEX) 500 MG capsule Take 1 capsule (500 mg total) by mouth 4 (four) times daily. Take all of medicine and drink lots of fluids 12/02/13   Billy Fischer, MD  cyclobenzaprine (FLEXERIL) 10 MG tablet Take 0.5-1 tablets (5-10 mg total) by mouth 3 (three) times daily as needed. 10/03/13   [name], DO  hydrocortisone ointment 0.5 % Apply 1 application topically 2 (two) times daily. 10/12/13   Janett Billow D. Zehr, PA-C  meloxicam (MOBIC) 15 MG tablet Take 1 tablet (15 mg total) by mouth daily. 11/08/13   Nolon Rod, DO  neomycin-polymyxin-hydrocortisone (CORTISPORIN) otic solution Place 4 drops into the right ear 3 (three) times daily. 10/17/13   Coral Spikes, DO  omeprazole (PRILOSEC) 20 MG capsule Take 1 capsule (20 mg total) by mouth daily. 11/29/13   [name], DO  traZODone (DESYREL) 50 MG tablet Take 0.5-1 tablets (25-50 mg total) by mouth at bedtime as needed for sleep. 07/13/13   Melony Overly, MD  venlafaxine (EFFEXOR) 75 MG tablet Take 1 tablet (75 mg  total) by mouth 2 (two) times daily. 11/29/13   [name], DO   Pulse 78  Temp(Src) 98.1 F (36.7 C) (Oral)  Resp 18  SpO2 98% Physical Exam  Nursing note and vitals reviewed. Constitutional: She is oriented to person, place, and time. She appears well-developed and well-nourished.  Abdominal: Soft. Bowel sounds are normal. She exhibits no distension and no mass. There is no tenderness. There is no rebound and no guarding.  Neurological: She is alert and oriented to person, place, and time.  Skin: Skin is warm and dry.    ED Course  Procedures (including critical care time) Labs Review Labs Reviewed  POCT URINALYSIS DIP  (DEVICE) - Abnormal; Notable for the following:    Hgb urine dipstick MODERATE (*)    Leukocytes, UA MODERATE (*)    All other components within normal limits    Imaging Review No results found.   MDM   1. UTI (lower urinary tract infection)        Billy Fischer, MD 12/02/13 [phone]

## 2013-12-02 NOTE — ED Notes (Signed)
Patient c/o painful and burning urination x 3 days. Patient reports she has not had any discharge or fever. Patient is alert and oriented and in no acute distress.

## 2013-12-05 ENCOUNTER — Telehealth: Payer: Self-pay | Admitting: *Deleted

## 2013-12-05 NOTE — Telephone Encounter (Signed)
Pt was seen at Urgent Care on Friday; treated for UTI and given Keflex.  Pt advised to complete all medications as prescribed and drink plenty of fluids.  After medication is completed and if symptoms are not relieved to call clinic for an appt.  Pt stated understanding.  Derl Barrow, RN

## 2013-12-05 NOTE — Telephone Encounter (Signed)
Pt went to urgent care on Friday and was dx with bacterial infection, was given abx, pt has taken if for 3 days with no relief. Still has 2 more days but is still having a burning sensation when urinating. Wants to know if she needs to be seen again or should she wait until all meds are gone?

## 2013-12-07 ENCOUNTER — Other Ambulatory Visit (HOSPITAL_COMMUNITY)
Admission: RE | Admit: 2013-12-07 | Discharge: 2013-12-07 | Disposition: A | Discharge status: Home / Self Care | Payer: Medicaid Other | Source: Ambulatory Visit | Attending: Family Medicine | Admitting: Family Medicine

## 2013-12-07 ENCOUNTER — Ambulatory Visit (INDEPENDENT_AMBULATORY_CARE_PROVIDER_SITE_OTHER): Payer: Medicaid Other | Admitting: Family Medicine

## 2013-12-07 ENCOUNTER — Encounter: Payer: Self-pay | Admitting: Family Medicine

## 2013-12-07 VITALS — BP 112/76 | HR 92 | Temp 98.6°F | Ht 65.0 in | Wt 192.0 lb

## 2013-12-07 DIAGNOSIS — R300 Dysuria: Secondary | ICD-10-CM | POA: Insufficient documentation

## 2013-12-07 DIAGNOSIS — Z113 Encounter for screening for infections with a predominantly sexual mode of transmission: Secondary | ICD-10-CM | POA: Insufficient documentation

## 2013-12-07 DIAGNOSIS — A499 Bacterial infection, unspecified: Secondary | ICD-10-CM

## 2013-12-07 DIAGNOSIS — N766 Ulceration of vulva: Secondary | ICD-10-CM

## 2013-12-07 DIAGNOSIS — B009 Herpesviral infection, unspecified: Secondary | ICD-10-CM | POA: Insufficient documentation

## 2013-12-07 DIAGNOSIS — N760 Acute vaginitis: Secondary | ICD-10-CM

## 2013-12-07 DIAGNOSIS — B9689 Other specified bacterial agents as the cause of diseases classified elsewhere: Secondary | ICD-10-CM | POA: Insufficient documentation

## 2013-12-07 LAB — POCT URINALYSIS DIPSTICK
Bilirubin, UA: NEGATIVE
GLUCOSE UA: NEGATIVE
NITRITE UA: NEGATIVE
PROTEIN UA: NEGATIVE
Spec Grav, UA: 1.02
Urobilinogen, UA: 0.2
pH, UA: 5.5

## 2013-12-07 LAB — POCT WET PREP (WET MOUNT)

## 2013-12-07 LAB — POCT UA - MICROSCOPIC ONLY: WBC, Ur, HPF, POC: >20

## 2013-12-07 MED ORDER — CIPROFLOXACIN HCL 250 MG PO TABS: 250.0000 mg | ORAL_TABLET | Freq: Two times a day (BID) | ORAL | Status: DC

## 2013-12-07 MED ORDER — METRONIDAZOLE 500 MG PO TABS: 500.0000 mg | ORAL_TABLET | Freq: Two times a day (BID) | ORAL | Status: DC

## 2013-12-07 NOTE — Progress Notes (Signed)
   Subjective:    Patient ID: Amanda Dickerson, female    DOB: 01-15-1961, 53 y.o.   MRN: [id number]  Patient presents for a same day appointment.  HPI  DYSURIA / VAGINAL IRRITATION: - Reported symptoms started with "burning on urination" started last Tuesday, then developed vaginal itching tried Vagisil x 1 dose and then persistent symptoms. She seen at [facility] on Friday 12/02/13 for burning urination, UA with neg nitrite, mod Hgb, mod leuks, consistent with UTI, treated with Keflex QID x 5 days (completed). Called [facility] to schedule follow-up d/t persistent symptoms, additionally on Sunday stated she felt "sick and shaky" for about 24 hours without other cold symptoms. - Admits to some occasional lower abdominal pains for about 1 week, described as sharp multiple quick pains lasting seconds, then resolved and may return hours later or days later - Reports long time since last UTI (>10 years ago), yeast infection (30 years ago) - No menstrual periods, last 3 years ago, hx endometrial ablation due to recurrent bleeding - Sexual active, 1 partner >1 year, no condom use, no partner symptoms or significant concerns for STDs - Admits to current dysuria seems more associated with "external discomfort" - Denies fevers/chills, vaginal discharge or bleeding, diarrhea, constipation, change in appetite  - Declines influenza vaccine  I have reviewed and updated the following as appropriate: allergies and current medications  Social Hx: - Never smoker  Review of Systems  See above HPI    Objective:   Physical Exam  BP 112/76  Pulse 92  Temp(Src) 98.6 F (37 C) (Oral)  Wt 192 lb (87.091 kg)  Gen - well-appearing, NAD HEENT - oropharynx clear, MMM Abd - soft, NTND, no masses, +active BS Skin - warm, dry, no rashes Pelvic Exam - Abnormal external female genitalia with multiple (3-5) erythematous < 1 cm very slightly raised papules bilateral discrete isolated locations on internal labial  folds, also 1 x 1 cm raw ulceration of Right upper vaginal tissue at introitus, no evidence of vesicles or fluid contained lesions, ulceration mildly tender to manipulation with swab. Vaginal canal without lesions. Normal appearing cervix, without lesions or bleeding. Thick white (non-purulent) discharge on exam. Bimanual exam without masses or cervical motion tenderness.     Assessment & Plan:   See specific A&P problem list for details.

## 2013-12-07 NOTE — Assessment & Plan Note (Signed)
History not entirely consistent with BV, with vaginal irritation/itching some discharge, seems to have followed recent unprotected intercourse. - No recent episodes BV - Wet prep - few clue cells, no yeast, no trich  Plan: 1. Treat BV with Metronidazole 538m BID x 7 days, avoid alcohol

## 2013-12-07 NOTE — Assessment & Plan Note (Signed)
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 STD work-up: Herpes simplex viral culture (swab ulcer), HSV2 antibody IgG, HIV, RPR, GC/Chlamdyia endocervical swab 2. Continue treatment for UTI / BV 3. Agreed to hold off on empiric treatment for HSV at this time. 4. Avoid sexual intercourse until results reviewed 5. Will notify with results when available (discussed potential risk of negative culture on swab, may need further work-up)

## 2013-12-07 NOTE — Assessment & Plan Note (Signed)
Consistent with persistent dysuria (seems mostly external) >1 week, previously dx UTI on UA (no culture) and completed Keflex x 5 days without improvement. Possible persistent UTI (not resolved on cephalosporin?), concern for external cause of dysuria (HSV infection) with identified ulceration / lesions on pelvic exam - Afebrile, no systemic symptoms, no hematuria  Plan: 1. Check UA / micro - neg nitrite, mod leuks (WBC >20), trace RBC (5-10), 5-10 squams, bacteria +3 2. Urine culture - follow-up 3. Switch antibiotic course - start Cipro 257m PO BID x 3 days (based on culture / response, will notify if need to prolong or switch abx, note PCN allergy) 4. RTC persistent / worsening symptoms 1 week

## 2013-12-07 NOTE — Patient Instructions (Signed)
Dear Amanda Dickerson, Thank you for coming in to clinic today. It was good to see you!  Today we discussed your Vaginal / Urinary symptoms. 1. Your urine looks concerning for a UTI, and it did not respond to keflex. We will try Cipro - take 1 tablet every 12 hours or 2 times daily for 3 days. Also, sending Urine Culture and we will call you with this result next week, if we need we may have you take more Cipro or a different antibiotic. 2. Also, on the vaginal swab we found some evidence of Bacterial Vaginosis or BV - take Flagyl 560m twice daily for 7 days to treat this, do not drink alcohol on this medication. 3. For the ulcer and lesions on exam, we have swabbed them and checked blood tests for (Herpes, HIV, and syphilis) your results will be available next week and we will call you with those.  Please schedule a follow-up appointment with me (Dr. KParks Ranger in 1 to 2 weeks for follow-up if symptoms do not resolve or if anything worsens.  If you have any other questions or concerns, please feel free to call the clinic to contact me. You may also schedule an earlier appointment if necessary.  However, if your symptoms get significantly worse, please go to the Emergency Department to seek immediate medical attention.  Amanda Dickerson DPort Sulphur

## 2013-12-08 LAB — HIV ANTIBODY (ROUTINE TESTING W REFLEX): HIV 1&2 Ab, 4th Generation: NONREACTIVE

## 2013-12-08 LAB — RPR

## 2013-12-08 LAB — CERVICOVAGINAL ANCILLARY ONLY
CHLAMYDIA, DNA PROBE: NEGATIVE
NEISSERIA GONORRHEA: NEGATIVE

## 2013-12-08 LAB — HSV 2 ANTIBODY, IGG: HSV 2 Glycoprotein G Ab, IgG: <0.1 IV

## 2013-12-09 LAB — URINE CULTURE
COLONY COUNT: NO GROWTH
Organism ID, Bacteria: NO GROWTH

## 2013-12-09 LAB — HERPES SIMPLEX VIRUS CULTURE: ORGANISM ID, BACTERIA: DETECTED

## 2013-12-12 ENCOUNTER — Telehealth: Payer: Self-pay | Admitting: Family Medicine

## 2013-12-12 DIAGNOSIS — B009 Herpesviral infection, unspecified: Secondary | ICD-10-CM

## 2013-12-12 MED ORDER — VALACYCLOVIR HCL 500 MG PO TABS: 500.0000 mg | ORAL_TABLET | Freq: Two times a day (BID) | ORAL | Status: DC

## 2013-12-12 MED ORDER — VALACYCLOVIR HCL 1 G PO TABS: 1000.0000 mg | ORAL_TABLET | Freq: Two times a day (BID) | ORAL | Status: DC

## 2013-12-12 NOTE — Assessment & Plan Note (Signed)
Update: - Positive Herpes Simplex culture - Type 2 (labial ulcer swab) - Negative Herpes Type 2 IgG glycoprotein blood test (suspect primary infection) - Negative GC/Chlam - Non-reactive HIV, RPR - Treat Valacyclovir 1g BID x 10 days, will provide future rx for recurrent episodes

## 2013-12-12 NOTE — Telephone Encounter (Signed)
Called patient to discuss results from last OV on 12/07/13 for dysuria and genital ulcer, at that time found to have UTI and BV, treated with Cipro and Flagyl with significant resolution of symptoms. Additionally identified genital ulcer on exam, swabbed for HSV culture.  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 Strongly advised to avoid sexual intercourse during active outbreak, partner wear condoms, and investigate partner with blood tests to determine if also infected. Additionally, provided rx for lower dose Valacyclovir 527m BID x 3 days (dispse #6 with refill) for future recurrent episodes, advised patient to fill rx and start taking at first sign of genital burning / ulceration or lesions. Patient understood, questions answered, will follow-up PRN.  ANobie Putnam DPennington PGY-2

## 2014-01-02 ENCOUNTER — Encounter: Payer: Self-pay | Admitting: Family Medicine

## 2014-02-21 ENCOUNTER — Other Ambulatory Visit: Payer: Self-pay | Admitting: Family Medicine

## 2014-04-21 ENCOUNTER — Encounter: Payer: Self-pay | Admitting: Family Medicine

## 2014-05-10 ENCOUNTER — Other Ambulatory Visit: Payer: Self-pay | Admitting: Family Medicine

## 2014-05-10 DIAGNOSIS — J4520 Mild intermittent asthma, uncomplicated: Secondary | ICD-10-CM

## 2014-05-24 ENCOUNTER — Other Ambulatory Visit: Payer: Self-pay

## 2014-05-24 DIAGNOSIS — Z1231 Encounter for screening mammogram for malignant neoplasm of breast: Secondary | ICD-10-CM

## 2014-05-30 ENCOUNTER — Encounter (INDEPENDENT_AMBULATORY_CARE_PROVIDER_SITE_OTHER): Payer: Self-pay

## 2014-05-30 ENCOUNTER — Ambulatory Visit
Admission: RE | Admit: 2014-05-30 | Discharge: 2014-05-30 | Disposition: A | Discharge status: Home / Self Care | Payer: Medicaid Other | Source: Ambulatory Visit

## 2014-05-30 DIAGNOSIS — Z1231 Encounter for screening mammogram for malignant neoplasm of breast: Secondary | ICD-10-CM

## 2014-05-30 LAB — HM MAMMOGRAPHY

## 2014-06-13 ENCOUNTER — Other Ambulatory Visit: Payer: Self-pay | Admitting: Family Medicine

## 2014-06-13 DIAGNOSIS — R232 Flushing: Secondary | ICD-10-CM

## 2014-07-21 ENCOUNTER — Telehealth: Payer: Self-pay | Admitting: Family Medicine

## 2014-07-21 NOTE — Telephone Encounter (Signed)
Returned call to patient regarding chest pain and dizzy spells.  Pt stated she has been having chest pain, SOB, dizziness and fatigue off and on for almost a month now.  Pt denies any chest pain, dizziness, SOB, headache, n/v, numbness/tinging of extremities today; stated "I am fine today".  Appt scheduled for Tuesday with Dr. Wendi Snipes at 8:30 AM.  Pt strongly advised to go to ED or call 911 as soon as symptoms start and not to wait.  Pt stated understanding. Precept with Dr. McDiarmid; agree with plan.  Will forward to PCP.  Derl Barrow, RN

## 2014-07-21 NOTE — Telephone Encounter (Signed)
Pt would like to speak with RN about dizzy spells and chest pains she has been having, needs to know how soon she should have them checked out

## 2014-07-25 ENCOUNTER — Ambulatory Visit (INDEPENDENT_AMBULATORY_CARE_PROVIDER_SITE_OTHER): Payer: Medicaid Other | Admitting: Family Medicine

## 2014-07-25 ENCOUNTER — Encounter: Payer: Self-pay | Admitting: Family Medicine

## 2014-07-25 VITALS — BP 134/86 | HR 66 | Temp 98.1°F | Ht 65.0 in | Wt 203.0 lb

## 2014-07-25 DIAGNOSIS — H8110 Benign paroxysmal vertigo, unspecified ear: Secondary | ICD-10-CM | POA: Diagnosis not present

## 2014-07-25 DIAGNOSIS — R079 Chest pain, unspecified: Secondary | ICD-10-CM

## 2014-07-25 LAB — CBC WITH DIFFERENTIAL/PLATELET
Basophils Absolute: 0 10*3/uL (ref 0.0–0.1)
Basophils Relative: 0 % (ref 0–1)
Eosinophils Absolute: 0.1 10*3/uL (ref 0.0–0.7)
Eosinophils Relative: 4 % (ref 0–5)
HCT: 39.3 % (ref 36.0–46.0)
Hemoglobin: 13 g/dL (ref 12.0–15.0)
LYMPHS ABS: 1.8 10*3/uL (ref 0.7–4.0)
Lymphocytes Relative: 50 % — ABNORMAL HIGH (ref 12–46)
MCH: 27.3 pg (ref 26.0–34.0)
MCHC: 33.1 g/dL (ref 30.0–36.0)
MCV: 82.6 fL (ref 78.0–100.0)
MONOS PCT: 10 % (ref 3–12)
MPV: 9 fL (ref 8.6–12.4)
Monocytes Absolute: 0.4 10*3/uL (ref 0.1–1.0)
NEUTROS ABS: 1.3 10*3/uL — AB (ref 1.7–7.7)
NEUTROS PCT: 36 % — AB (ref 43–77)
Platelets: 291 10*3/uL (ref 150–400)
RBC: 4.76 MIL/uL (ref 3.87–5.11)
RDW: 13.1 % (ref 11.5–15.5)
WBC: 3.5 10*3/uL — ABNORMAL LOW (ref 4.0–10.5)

## 2014-07-25 LAB — COMPREHENSIVE METABOLIC PANEL
ALT: 27 U/L (ref 0–35)
AST: 24 U/L (ref 0–37)
Albumin: 4.1 g/dL (ref 3.5–5.2)
Alkaline Phosphatase: 83 U/L (ref 39–117)
BUN: 10 mg/dL (ref 6–23)
CHLORIDE: 103 meq/L (ref 96–112)
CO2: 26 mEq/L (ref 19–32)
CREATININE: 0.67 mg/dL (ref 0.50–1.10)
Calcium: 9.1 mg/dL (ref 8.4–10.5)
Glucose, Bld: 116 mg/dL — ABNORMAL HIGH (ref 70–99)
Potassium: 4 mEq/L (ref 3.5–5.3)
Sodium: 138 mEq/L (ref 135–145)
TOTAL PROTEIN: 7.1 g/dL (ref 6.0–8.3)
Total Bilirubin: 0.5 mg/dL (ref 0.2–1.2)

## 2014-07-25 LAB — LIPID PANEL
CHOL/HDL RATIO: 3.2 ratio
CHOLESTEROL: 207 mg/dL — AB (ref 0–200)
HDL: 64 mg/dL (ref >=46)
LDL Cholesterol: 113 mg/dL — ABNORMAL HIGH (ref 0–99)
Triglycerides: 150 mg/dL — ABNORMAL HIGH (ref <150)
VLDL: 30 mg/dL (ref 0–40)

## 2014-07-25 LAB — TSH: TSH: 2.045 u[IU]/mL (ref 0.350–4.500)

## 2014-07-25 NOTE — Assessment & Plan Note (Signed)
Atypical chest pain with typical features happening at rest mostly and occasionally with exertion Slightly different than her previous presentation EKG today normal Previous stress echo normal labs Refer to cardiology for further evaluation, likely repeat stress echo

## 2014-07-25 NOTE — Patient Instructions (Signed)
Great to meet you!  You should hear from cardiology soon.   Try out the BPPV exercises  Chest Pain (Nonspecific) It is often hard to give a specific diagnosis for the cause of chest pain. There is always a chance that your pain could be related to something serious, such as a heart attack or a blood clot in the lungs. You need to follow up with your health care provider for further evaluation. CAUSES   Heartburn.  Pneumonia or bronchitis.  Anxiety or stress.  Inflammation around your heart (pericarditis) or lung (pleuritis or pleurisy).  A blood clot in the lung.  A collapsed lung (pneumothorax). It can develop suddenly on its own (spontaneous pneumothorax) or from trauma to the chest.  Shingles infection (herpes zoster virus). The chest wall is composed of bones, muscles, and cartilage. Any of these can be the source of the pain.  The bones can be bruised by injury.  The muscles or cartilage can be strained by coughing or overwork.  The cartilage can be affected by inflammation and become sore (costochondritis). DIAGNOSIS  Lab tests or other studies may be needed to find the cause of your pain. Your health care provider may have you take a test called an ambulatory electrocardiogram (ECG). An ECG records your heartbeat patterns over a 24-hour period. You may also have other tests, such as:  Transthoracic echocardiogram (TTE). During echocardiography, sound waves are used to evaluate how blood flows through your heart.  Transesophageal echocardiogram (TEE).  Cardiac monitoring. This allows your health care provider to monitor your heart rate and rhythm in real time.  Holter monitor. This is a portable device that records your heartbeat and can help diagnose heart arrhythmias. It allows your health care provider to track your heart activity for several days, if needed.  Stress tests by exercise or by giving medicine that makes the heart beat faster. TREATMENT   Treatment  depends on what may be causing your chest pain. Treatment may include:  Acid blockers for heartburn.  Anti-inflammatory medicine.  Pain medicine for inflammatory conditions.  Antibiotics if an infection is present.  You may be advised to change lifestyle habits. This includes stopping smoking and avoiding alcohol, caffeine, and chocolate.  You may be advised to keep your head raised (elevated) when sleeping. This reduces the chance of acid going backward from your stomach into your esophagus. Most of the time, nonspecific chest pain will improve within 2-3 days with rest and mild pain medicine.  HOME CARE INSTRUCTIONS   If antibiotics were prescribed, take them as directed. Finish them even if you start to feel better.  For the next few days, avoid physical activities that bring on chest pain. Continue physical activities as directed.  Do not use any tobacco products, including cigarettes, chewing tobacco, or electronic cigarettes.  Avoid drinking alcohol.  Only take medicine as directed by your health care provider.  Follow your health care provider's suggestions for further testing if your chest pain does not go away.  Keep any follow-up appointments you made. If you do not go to an appointment, you could develop lasting (chronic) problems with pain. If there is any problem keeping an appointment, call to reschedule. SEEK MEDICAL CARE IF:   Your chest pain does not go away, even after treatment.  You have a rash with blisters on your chest.  You have a fever. SEEK IMMEDIATE MEDICAL CARE IF:   You have increased chest pain or pain that spreads to your arm, neck,  jaw, back, or abdomen.  You have shortness of breath.  You have an increasing cough, or you cough up blood.  You have severe back or abdominal pain.  You feel nauseous or vomit.  You have severe weakness.  You faint.  You have chills. This is an emergency. Do not wait to see if the pain will go away. Get  medical help at once. Call your local emergency services (911 in [place] Do not drive yourself to the hospital. MAKE SURE YOU:   Understand these instructions.  Will watch your condition.  Will get help right away if you are not doing well or get worse. Document Released: 11/27/2004 Document Revised: 02/22/2013 Document Reviewed: 09/23/2007 [facility] Patient Information 2015 [place], [place]. This information is not intended to replace advice given to you by your health care provider. Make sure you discuss any questions you have with your health care provider.

## 2014-07-25 NOTE — Progress Notes (Signed)
Patient ID: REISE HIETALA, female   DOB: [birth year], 54 y.o.   MRN: [id number]   HPI  Patient presents today for same-day appointment for chest pain and dizziness  Chest pain Patient describes central sharp chest pain happening at rest mostly but also with exertion. She describes it as central nonradiating sharp chest pain that lasts about 10-30 seconds. There does not seem to be any definitive aggravating factors, there are no alleviating factors. She has some mild mid chest soreness to palpation of her mid sternum but that does not reproduce the pain. She does not have associated sweating, nausea, dyspnea She's not taking any medications for it She previously had a stress test which was normal, approximately 3 years ago.  Dizziness dizziness for last 3-4 months. Happens intermittently. Described as the room spinning and happening most consistently with head turning. He comes and goes. She was seen by ENT previously and told there is no clear etiology of her dizziness. She did not give Dix-Hallpike there. I will see her pain, fever, chills, change in appetite.  Some racing heart occasionally, largely asymptomatic Some dyspnea, however consistent with her usual asthma symptoms.  PMH: Smoking status noted - Never smoker ROS: Per HPI  Objective: BP 134/86 mmHg  Pulse 66  Temp(Src) 98.1 F (36.7 C) (Oral)  Ht 5' 5"  (1.651 m)  Wt 203 lb (92.08 kg)  BMI 33.78 kg/m2 Gen: NAD, alert, cooperative with exam HEENT: NCAT, Tms WNL BL,  CV: RRR, good S1/S2, no murmur, slight soreness to a palpation of mid chest Resp: CTABL, no wheezes, non-labored Ext: No edema, warm Neuro: Alert and oriented, No gross deficits  EKG 07/25/2014: Normal sinus rhythm  Assessment and plan:  Chest pain Atypical chest pain with typical features happening at rest mostly and occasionally with exertion Slightly different than her previous presentation EKG today normal Previous stress echo normal labs Refer to  cardiology for further evaluation, likely repeat stress echo   BPPV (benign paroxysmal positional vertigo) His initial description typical of CPPD Dix-Hallpike negative Has been evaluated by ENT with no Dix-Hallpike 0 Discussed with her usefulness of Antivert, given handout describing Epley maneuver Recommended calling ENT for reevaluation if not improved in one 2 weeks.     Orders Placed This Encounter  Procedures  . TSH  . Comprehensive metabolic panel  . CBC with Differential  . Lipid Panel  . EKG 12-Lead

## 2014-07-25 NOTE — Assessment & Plan Note (Signed)
His initial description typical of CPPD Dix-Hallpike negative Has been evaluated by ENT with no Dix-Hallpike 0 Discussed with her usefulness of Antivert, given handout describing Epley maneuver Recommended calling ENT for reevaluation if not improved in one 2 weeks.

## 2014-07-26 ENCOUNTER — Encounter: Payer: Self-pay | Admitting: Family Medicine

## 2014-07-27 ENCOUNTER — Encounter: Payer: Self-pay | Admitting: Physician Assistant

## 2014-08-24 ENCOUNTER — Encounter: Payer: Self-pay | Admitting: *Deleted

## 2014-09-05 ENCOUNTER — Ambulatory Visit: Payer: Medicaid Other | Admitting: Physician Assistant

## 2014-09-13 ENCOUNTER — Ambulatory Visit (INDEPENDENT_AMBULATORY_CARE_PROVIDER_SITE_OTHER): Payer: Medicaid Other | Admitting: Physician Assistant

## 2014-09-13 ENCOUNTER — Encounter: Payer: Self-pay | Admitting: Physician Assistant

## 2014-09-13 VITALS — BP 112/72 | HR 59 | Ht 65.0 in | Wt 201.8 lb

## 2014-09-13 DIAGNOSIS — J45909 Unspecified asthma, uncomplicated: Secondary | ICD-10-CM | POA: Diagnosis not present

## 2014-09-13 DIAGNOSIS — K219 Gastro-esophageal reflux disease without esophagitis: Secondary | ICD-10-CM | POA: Diagnosis not present

## 2014-09-13 DIAGNOSIS — R079 Chest pain, unspecified: Secondary | ICD-10-CM | POA: Diagnosis not present

## 2014-09-13 NOTE — Patient Instructions (Signed)
Medication Instructions:  1. INCREASE PRILOSEC TO 20 MG DAILY FOR 1 WEEK THEN RESUME AS NEEDED  Labwork: NONE  Testing/Procedures: Your physician has requested that you have en exercise stress myoview. For further information please visit [email or web address]. Please follow instruction sheet, as given.   Follow-Up: DR. Johnsie Cancel AS NEEDED  Any Other Special Instructions Will Be Listed Below (If Applicable).

## 2014-09-13 NOTE — Progress Notes (Signed)
Cardiology Office Note   Date:  09/13/2014   ID:  Amanda Dickerson, DOB Nov 01, 1960, MRN [id number]  PCP:  [name], DO  Cardiologist:  Dr. Jenkins Rouge    Chief Complaint  Patient presents with  . Chest Pain     History of Present Illness: Amanda Dickerson is a 54 y.o. female with a hx of asthma, fibromyalgia, GERD, Crohn's disease. Seen by Dr. Johnsie Cancel 01/2012 for palpitations and chest discomfort.  ETT-Echo was ok.  Event monitor was arranged but never done.    Over the last 2 mos, she has been experiencing sharp chest pain.  She had some symptoms awaken her from sleep.  She notices it more if she is fatigued.  She may have symptoms with activity but denies exertional symptoms per se.  She denies any significant worsening in her DOE. She has asthma.  She is NYHA 2-2b.  No orthopnea, PND, edema.  No syncope.  She has had dizziness that she attributes to her BPPV. CP is sharp and not assoc with other symptoms.  She denies pleuritic CP or CP with lying supine.  No odynophagia.  She does have dysphagia.  Has seen Dr. Fuller Plan in the past for colonoscopy.    Studies/Reports Reviewed Today:  ETT-Echo 02/2012 - Stress ECG conclusions: The stress ECG was normal. - Staged echo: Normal echo stress Impressions:  - Normal study after maximal exercise.  Past Medical History  Diagnosis Date  . Asthma   . Headache(784.0)   . Fibromyalgia   . Dysfunctional uterine bleeding   . Anxiety   . Anemia   . Arthritis     KNEES  . GERD (gastroesophageal reflux disease)   . Crohn's colitis     Past Surgical History  Procedure Laterality Date  . Cesarean section  1988  . Tubal ligation  2000  . Multiple tooth extractions    . Endometrial ablation w/ novasure  08/2011  . Colonoscopy       Current Outpatient Prescriptions  Medication Sig Dispense Refill  . ciprofloxacin (CIPRO) 250 MG tablet Take 1 tablet (250 mg total) by mouth 2 (two) times daily. 6 tablet 0  . cyclobenzaprine  (FLEXERIL) 10 MG tablet Take 0.5-1 tablets (5-10 mg total) by mouth 3 (three) times daily as needed. 30 tablet 3  . hydrocortisone ointment 0.5 % Apply 1 application topically 2 (two) times daily. 30 g 0  . meloxicam (MOBIC) 15 MG tablet Take 1 tablet (15 mg total) by mouth daily. 30 tablet 0  . metroNIDAZOLE (FLAGYL) 500 MG tablet Take 1 tablet (500 mg total) by mouth 2 (two) times daily. 14 tablet 0  . omeprazole (PRILOSEC) 20 MG capsule Take 1 capsule (20 mg total) by mouth daily. 30 capsule 2  . PROAIR HFA 108 (90 BASE) MCG/ACT inhaler INHALE 2 PUFFS INTO THE LUNGS EVERY 6 (SIX) HOURS AS NEEDED FOR WHEEZING. 8.5 Inhaler 3  . traZODone (DESYREL) 50 MG tablet Take 0.5-1 tablets (25-50 mg total) by mouth at bedtime as needed for sleep. 30 tablet 3  . valACYclovir (VALTREX) 1000 MG tablet Take 1 tablet (1,000 mg total) by mouth 2 (two) times daily. 20 tablet 0  . valACYclovir (VALTREX) 500 MG tablet Take 1 tablet (500 mg total) by mouth 2 (two) times daily. For 3 days at first sign of recurrence. 6 tablet 2  . venlafaxine (EFFEXOR) 75 MG tablet TAKE 1 TABLET (75 MG TOTAL) BY MOUTH 2 (TWO) TIMES DAILY. 60 tablet 3  No current facility-administered medications for this visit.    Allergies:   Ibuprofen and Penicillins    Social History:  The patient  reports that she has never smoked. She has never used smokeless tobacco. She reports that she drinks alcohol. She reports that she does not use illicit drugs.   Family History:  The patient's family history includes Diabetes in her father; Heart disease in her mother; Hyperlipidemia in her mother; Hypertension in her mother; Prostate cancer in her father.    ROS:   Please see the history of present illness.   Review of Systems  Constitution: Positive for malaise/fatigue.  HENT: Positive for headaches.   Cardiovascular: Positive for chest pain.  Respiratory: Positive for wheezing.   Musculoskeletal: Positive for joint pain and myalgias.    Gastrointestinal: Positive for constipation and dysphagia.  Neurological: Positive for dizziness.  All other systems reviewed and are negative.     PHYSICAL EXAM: VS:  BP 112/72 mmHg  Pulse 59  Ht 5' 5"  (1.651 m)  Wt 201 lb 12.8 oz (91.536 kg)  BMI 33.58 kg/m2    Wt Readings from Last 3 Encounters:  09/13/14 201 lb 12.8 oz (91.536 kg)  07/25/14 203 lb (92.08 kg)  12/07/13 192 lb (87.091 kg)     GEN: Well nourished, well developed, in no acute distress HEENT: normal Neck: no JVD, no carotid bruits, no masses Cardiac:  Normal S1/S2, RRR; no murmur ,  no rubs or gallops, no edema   Respiratory:  clear to auscultation bilaterally, no wheezing, rhonchi or rales. GI: soft, nontender, nondistended, + BS MS: no deformity or atrophy Skin: warm and dry  Neuro:  CNs II-XII intact, Strength and sensation are intact Psych: Normal affect   EKG:  EKG is ordered today.  It demonstrates:   Sinus bradycardia, HR 59, normal axis, no ST changes, QTc 447 ms   Recent Labs: 07/25/2014: ALT 27; BUN 10; Creat 0.67; Hemoglobin 13.0; Platelets 291; Potassium 4.0; Sodium 138; TSH 2.045    Lipid Panel    Component Value Date/Time   CHOL 207* 07/25/2014 0910   TRIG 150* 07/25/2014 0910   HDL 64 07/25/2014 0910   CHOLHDL 3.2 07/25/2014 0910   VLDL 30 07/25/2014 0910   LDLCALC 113* 07/25/2014 0910      ASSESSMENT AND PLAN:  Chest pain, unspecified chest pain type:  Symptoms atypical for ischemia.  However, she did have some pain awaken her from sleep.  Arrange ETT-Myoview to rule out ischemia.  Increase Prilosec to 20 mg QD for 1 week, then resume prn. If CP improves with PPI, FU with GI.  If Myoview low risk, FU with Card prn.   GERD:  Adjust PPI as noted.  Asthma:  Potential cause for CP as well.  If symptoms persist, may need to see PCP back for further management.    Medication Changes: Current medicines are reviewed at length with the patient today.  Concerns regarding medicines are  as outlined above.  The following changes have been made:   Discontinued Medications   No medications on file   Modified Medications   No medications on file   New Prescriptions   No medications on file    Labs/ tests ordered today include:   No orders of the defined types were placed in this encounter.     Disposition:   FU with Dr. Jenkins Rouge as needed or sooner if stress testing is abnormal.   Signed, Richardson Dopp, PA-C, MHS 09/13/2014 2:27 PM  [facility] [address], [place], [place]  [address] Phone: [phone]; Fax: [phone]

## 2014-09-19 ENCOUNTER — Telehealth (HOSPITAL_COMMUNITY): Payer: Self-pay | Admitting: *Deleted

## 2014-09-19 NOTE — Telephone Encounter (Signed)
Left message on voicemail in reference to upcoming appointment scheduled for 09/21/14. Phone number given for a call back so details instructions can be given. Amanda Dickerson, Amanda Dickerson

## 2014-09-20 ENCOUNTER — Telehealth (HOSPITAL_COMMUNITY): Payer: Self-pay | Admitting: *Deleted

## 2014-09-20 NOTE — Telephone Encounter (Signed)
Patient given detailed instructions per Myocardial Perfusion Study Information Sheet for test on 09/21/14 at 8:15. Patient Notified to arrive 15 minutes early, and that it is imperative to arrive on time for appointment to keep from having the test rescheduled. Patient verbalized understanding. Hubbard Robinson, RN

## 2014-09-21 ENCOUNTER — Ambulatory Visit (HOSPITAL_COMMUNITY): Payer: Medicaid Other | Attending: Internal Medicine

## 2014-09-21 DIAGNOSIS — R079 Chest pain, unspecified: Secondary | ICD-10-CM

## 2014-09-21 LAB — MYOCARDIAL PERFUSION IMAGING
CHL CUP MPHR: 168 {beats}/min
CHL CUP RESTING HR STRESS: 54 {beats}/min
Estimated workload: 9.3 METS
Exercise duration (min): 7 min
Exercise duration (sec): 31 s
LHR: 0.38
LV dias vol: 71 mL
LV sys vol: 20 mL
Peak HR: 160 {beats}/min
Percent HR: 95 %
RPE: 18
SDS: 0
SRS: 0
SSS: 0
TID: 1.07

## 2014-09-21 MED ORDER — TECHNETIUM TC 99M SESTAMIBI GENERIC - CARDIOLITE
31.4000 | Freq: Once | INTRAVENOUS | Status: AC | PRN
  Administered 2014-09-21: 31 via INTRAVENOUS

## 2014-09-21 MED ORDER — TECHNETIUM TC 99M SESTAMIBI GENERIC - CARDIOLITE
10.9000 | Freq: Once | INTRAVENOUS | Status: AC | PRN
  Administered 2014-09-21: 10.9 via INTRAVENOUS

## 2014-09-22 ENCOUNTER — Telehealth: Payer: Self-pay | Admitting: *Deleted

## 2014-09-22 ENCOUNTER — Encounter: Payer: Self-pay | Admitting: Physician Assistant

## 2014-09-22 NOTE — Telephone Encounter (Signed)
Pt notified of normal myoview by phone with verbal understanding

## 2014-10-08 ENCOUNTER — Other Ambulatory Visit: Payer: Self-pay | Admitting: Family Medicine

## 2014-10-08 DIAGNOSIS — J4520 Mild intermittent asthma, uncomplicated: Secondary | ICD-10-CM

## 2014-10-08 DIAGNOSIS — R232 Flushing: Secondary | ICD-10-CM

## 2014-12-19 ENCOUNTER — Encounter: Payer: Self-pay | Admitting: Family Medicine

## 2014-12-19 ENCOUNTER — Ambulatory Visit (INDEPENDENT_AMBULATORY_CARE_PROVIDER_SITE_OTHER): Payer: Medicaid Other | Admitting: Family Medicine

## 2014-12-19 VITALS — BP 112/80 | HR 68 | Temp 98.6°F | Ht 65.0 in | Wt 205.0 lb

## 2014-12-19 DIAGNOSIS — H8110 Benign paroxysmal vertigo, unspecified ear: Secondary | ICD-10-CM

## 2014-12-19 DIAGNOSIS — M7741 Metatarsalgia, right foot: Secondary | ICD-10-CM | POA: Diagnosis not present

## 2014-12-19 DIAGNOSIS — M76829 Posterior tibial tendinitis, unspecified leg: Secondary | ICD-10-CM

## 2014-12-19 DIAGNOSIS — M797 Fibromyalgia: Secondary | ICD-10-CM | POA: Diagnosis not present

## 2014-12-19 MED ORDER — MELOXICAM 7.5 MG PO TABS: 7.5000 mg | ORAL_TABLET | Freq: Every day | ORAL | Status: DC

## 2014-12-19 MED ORDER — MECLIZINE HCL 25 MG PO TABS: 25.0000 mg | ORAL_TABLET | Freq: Three times a day (TID) | ORAL | Status: DC | PRN

## 2014-12-19 MED ORDER — TRAZODONE HCL 50 MG PO TABS: 25.0000 mg | ORAL_TABLET | Freq: Every evening | ORAL | Status: DC | PRN

## 2014-12-19 NOTE — Assessment & Plan Note (Signed)
Consistent with recurrent tendinitis problem now with bilateral pain, seems milder compared to previous, but now also on Right.  Plan: 1. Referral to return to Sports Medicine to resume care, as may seem refractory to prior treatments and could benefit from NTG patches, complicated by Fibromyalgia 2. Trial on Meloxicam 7.67m daily x 1 month

## 2014-12-19 NOTE — Assessment & Plan Note (Signed)
Consistent with BPPV with very brief episodes related to head movements, self limited. However some additional chronic longer lasting mild persistent dizziness symptoms. - Admits to some headaches, but does not seem predominant, and no hearing loss. - Previously followed by ENT  Plan: 1. Trial on Meclizine 76m TID PRN, max dose up to 576mper dose 2. Referral to resume care at ENT, may benefit from vestibular rehab in future

## 2014-12-19 NOTE — Progress Notes (Signed)
Subjective:    Patient ID: Amanda Dickerson, female    DOB: 10/13/1960, 54 y.o.   MRN: [id number]  Amanda Dickerson is a 54 y.o. female presenting on 12/19/2014 for Dizziness  HPI  DIZZINESS / BPPV, CHRONIC: - Last OV at [facility] 07/25/14 for same, diagnosed with BPPV, patient did not try Meclizine, however additional chart review with patient previously diagnosed with Vertigo and Dizziness spells back in 2013 with similar description, trial on Meclizine at that time with reported improvement, previously followed by [facility] ENT for this problem, never had vestibular rehab - Today reports same chronic episodes of dizziness, now with worsening over past 1 month, describes recent episodes of "dizziness and room spinning" and even tell "spinning" with eyes closed, woke her up from sleep, nausea without vomiting, difficulty ambulating and tried sitting up, able to eat small amount of PO, episode lasted about 1 hour gradually improved after eating a cracker and drinking some coca-cola. Similar episodes with night-time awakening, less severe only about 1x weekly. Associated with some "chest heaviness" but no chest pain or tightness, SOB, DOE. - Recent Cardiology work-up 09/2014 for atypical chest pain, had ETT-Myoview on 7/21 with normal results, no evidence ischemia, considered may need further GI work-up - Additionally, during the day will get quick episodes of "dizziness and swimmy headed" with quick episodes usually after turning head fast, lasting seconds to minutes, with some mild lightheadedness. Otherwise not any associated other symptoms, no nausea, vomiting, HA, vision changes - Family history of Vertigo (Mother)  RIGHT FOOT PAIN, BILATERAL ANKLE PAIN: - Known history of prior Left posterior tibialis tendinitis from 2013 to 2014, followed at [facility], treated with variety of therapies including sport insoles, scaphoid pads, home exercise regimen, Mobic, icing regimen, with improvement. Complicated by  Fibromyalgia, and discussion about possible NTG patch therapy but she did not follow-up. - Today reports new acute onset pain in bottom of Right foot, started >1 week ago, denies any injury, fall, or trauma. Has had a similar chronic pain occasionally over past >1 year but now seems to be more persistent, additionally has been dealing with chronic b/l ankle pain, located to inside of both ankles, Left was worse than Right, now pain with Right foot. With R-foot pain 1 week ago had some difficulty ambulating due to pain, now improved. - History of fibromyalgia, taking Effexor - Previously took Meloxicam 73m with relief few years ago - Has not tried any analgesic meds currently - Admits to difficulty sleeping with some pain, and previous known history of Fibromyalgia, previously improved on Trazodone and would like refill   Past Medical History  Diagnosis Date  . Asthma   . Headache(784.0)   . Fibromyalgia   . Dysfunctional uterine bleeding   . Anxiety   . Anemia   . Arthritis     KNEES  . GERD (gastroesophageal reflux disease)   . Crohn's colitis ([place]   . History of cardiovascular stress test     ETT-Myoview 7/16:  EF 71%, no ischemia or infarct; Low Risk    Social History   Social History  . Marital Status: Legally Separated    Spouse Name: N/A  . Number of Children: 1  . Years of Education: N/A   Occupational History  . Disabled    Social History Main Topics  . Smoking status: Never Smoker   . Smokeless tobacco: Never Used  . Alcohol Use: Yes     Comment: occasionally  . Drug Use: No  .  Sexual Activity: No   Other Topics Concern  . Not on file   Social History Narrative    Current Outpatient Prescriptions on File Prior to Visit  Medication Sig  . cyclobenzaprine (FLEXERIL) 10 MG tablet Take 0.5-1 tablets (5-10 mg total) by mouth 3 (three) times daily as needed.  Marland Kitchen omeprazole (PRILOSEC) 20 MG capsule Take 1 capsule (20 mg total) by mouth daily.  Marland Kitchen venlafaxine  (EFFEXOR) 75 MG tablet TAKE 1 TABLET (75 MG TOTAL) BY MOUTH 2 (TWO) TIMES DAILY.  . hydrocortisone ointment 0.5 % Apply 1 application topically 2 (two) times daily. (Patient not taking: Reported on 12/19/2014)  . PROAIR HFA 108 (90 BASE) MCG/ACT inhaler INHALE 2 PUFFS INTO THE LUNGS EVERY 6 (SIX) HOURS AS NEEDED FOR WHEEZING. (Patient not taking: Reported on 12/19/2014)  . valACYclovir (VALTREX) 1000 MG tablet Take 1 tablet (1,000 mg total) by mouth 2 (two) times daily. (Patient not taking: Reported on 12/19/2014)  . valACYclovir (VALTREX) 500 MG tablet Take 1 tablet (500 mg total) by mouth 2 (two) times daily. For 3 days at first sign of recurrence. (Patient not taking: Reported on 12/19/2014)   No current facility-administered medications on file prior to visit.    Review of Systems  Constitutional: Negative for fever, chills, diaphoresis, activity change, appetite change and fatigue.  HENT: Negative for congestion and hearing loss.   Eyes: Negative for visual disturbance.  Respiratory: Negative for cough, chest tightness, shortness of breath and wheezing.   Cardiovascular: Negative for chest pain, palpitations and leg swelling.  Gastrointestinal: Positive for nausea (without vomiting, with dizziness episodes). Negative for vomiting, abdominal pain, diarrhea and constipation.  Genitourinary: Negative for dysuria, frequency and hematuria.  Musculoskeletal: Positive for arthralgias (Bilateral ankle, and Right foot) and gait problem (with foot pain recently, improved). Negative for neck pain.  Skin: Negative for rash.  Neurological: Positive for dizziness (worse with episodes from head turning quickly) and light-headedness (with vertigo episodes). Negative for tremors, seizures, syncope, speech difficulty, weakness, numbness and headaches.  Hematological: Negative for adenopathy.  Psychiatric/Behavioral: Negative for behavioral problems and confusion. The patient is not nervous/anxious.    Per  HPI unless specifically indicated above     Objective:    BP 112/80 mmHg  Pulse 68  Temp(Src) 98.6 F (37 C) (Oral)  Ht 5' 5"  (1.651 m)  Wt 205 lb (92.987 kg)  BMI 34.11 kg/m2  Wt Readings from Last 3 Encounters:  12/19/14 205 lb (92.987 kg)  09/21/14 201 lb (91.173 kg)  09/13/14 201 lb 12.8 oz (91.536 kg)    Physical Exam  Constitutional: She is oriented to person, place, and time. She appears well-developed and well-nourished. No distress.  HENT:  Head: Normocephalic and atraumatic.  Right Ear: External ear normal.  Left Ear: External ear normal.  Nose: Nose normal.  Mouth/Throat: Oropharynx is clear and moist.  Sinuses non-tender. Bilateral ear canals clear, TMs clear without infection or effusion.  Eyes: Conjunctivae and EOM are normal. Pupils are equal, round, and reactive to light. Right eye exhibits no discharge. Left eye exhibits no discharge.  No nystagmus  Neck: Normal range of motion. Neck supple. No thyromegaly present.  Cardiovascular: Normal rate, regular rhythm, normal heart sounds and intact distal pulses.   No murmur heard. Pulmonary/Chest: Effort normal and breath sounds normal. No respiratory distress. She has no wheezes. She has no rales. She exhibits no tenderness.  Musculoskeletal: Normal range of motion. She exhibits no edema or tenderness.       Right  ankle: She exhibits normal range of motion, no swelling, no ecchymosis, no deformity and normal pulse. No tenderness. No lateral malleolus and no medial malleolus tenderness found. Achilles tendon normal.       Feet:  Lymphadenopathy:    She has no cervical adenopathy.  Neurological: She is alert and oriented to person, place, and time. No cranial nerve deficit. She exhibits normal muscle tone.  Skin: Skin is warm and dry. No rash noted. She is not diaphoretic.  Psychiatric: She has a normal mood and affect. Her behavior is normal.  Nursing note and vitals reviewed.  Results for orders placed or  performed in visit on 09/21/14  Myocardial Perfusion Imaging  Result Value Ref Range   Rest HR 54 bpm   Rest BP 108/72 mmHg   Exercise duration (min) 7 min   Exercise duration (sec) 31 sec   Estimated workload 9.3 METS   Peak HR 160 bpm   Peak BP 145/74 mmHg   MPHR 168 bpm   Percent HR 95 %   RPE 18    LV Systolic Volume 20 mL   TID 3.01    LV Diastolic Volume 71 mL   LHR 0.38    SSS 0    SRS 0    SDS 0       Assessment & Plan:   Problem List Items Addressed This Visit      Nervous and Auditory   BPPV (benign paroxysmal positional vertigo) - Primary    Consistent with BPPV with very brief episodes related to head movements, self limited. However some additional chronic longer lasting mild persistent dizziness symptoms. - Admits to some headaches, but does not seem predominant, and no hearing loss. - Previously followed by ENT  Plan: 1. Trial on Meclizine 35m TID PRN, max dose up to 541mper dose 2. Referral to resume care at ENT, may benefit from vestibular rehab in future      Relevant Medications   meclizine (ANTIVERT) 25 MG tablet   Other Relevant Orders   Ambulatory referral to ENT     Musculoskeletal and Integument   Fibromyalgia    Chronic problem, without acute worsening or flare - Some additional pains in lower extremities, may not be related - Difficulty sleeping with pain  Plan: 1. Refill Trazodone 5075m take half tab 88m47m whole 50mg47m prn 2. Continue Effexor 3. Trial on Meloxicam 7.5mg f85mlikely MSK pain in LE, future consider alternative agents in addition to Effexor if not providing relief      Relevant Medications   meloxicam (MOBIC) 7.5 MG tablet   Posterior tibial tendinitis    Consistent with recurrent tendinitis problem now with bilateral pain, seems milder compared to previous, but now also on Right.  Plan: 1. Referral to return to Sports Medicine to resume care, as may seem refractory to prior treatments and could benefit from  NTG patches, complicated by Fibromyalgia 2. Trial on Meloxicam 7.5mg da72m x 1 month      Relevant Medications   meloxicam (MOBIC) 7.5 MG tablet   Other Relevant Orders   Ambulatory referral to Sports Medicine     Other   Metatarsalgia of right foot    New concerns without inciting trauma or injury, could be referred from posterior tib tendinitis, now seems to be bilateral. Also could be related to fibromyalgia.  Plan: 1. Trial on Meloxicam 7.5mg dai37m2. Referral to Sports Medicine      Relevant Medications   meloxicam (MOBIC)  7.5 MG tablet   Other Relevant Orders   Ambulatory referral to Sports Medicine      Meds ordered this encounter  Medications  . meloxicam (MOBIC) 7.5 MG tablet    Sig: Take 1 tablet (7.5 mg total) by mouth daily.    Dispense:  30 tablet    Refill:  0  . meclizine (ANTIVERT) 25 MG tablet    Sig: Take 1 tablet (25 mg total) by mouth 3 (three) times daily as needed for dizziness.    Dispense:  60 tablet    Refill:  1  . traZODone (DESYREL) 50 MG tablet    Sig: Take 0.5-1 tablets (25-50 mg total) by mouth at bedtime as needed for sleep.    Dispense:  30 tablet    Refill:  5      Follow up plan: Return in about 6 weeks (around 01/30/2015) for BPPV, tendinitis.  A total of 25 minutes was spent face-to-face with this patient. Over half this time was spent on counseling patient on the diagnosis and different diagnostic and therapeutic options available.  [name], [place], PGY-3

## 2014-12-19 NOTE — Assessment & Plan Note (Signed)
New concerns without inciting trauma or injury, could be referred from posterior tib tendinitis, now seems to be bilateral. Also could be related to fibromyalgia.  Plan: 1. Trial on Meloxicam 7.40m daily 2. Referral to Sports Medicine

## 2014-12-19 NOTE — Patient Instructions (Signed)
Dear Amanda Dickerson, Thank you for coming in to clinic today. It was good to see you!  1. For your Dizziness - take Meclizine (Antivert) 36m every 8 hours or 3 times daily as needed, if not helping after 2-3 days, you can increase to make dose of 2 pills or 556mper dose - ENT referral for follow-up on Vertigo, they may refer you to Vestibular Rehab Therapy 2. For Ankle pain and Right Foot Pain - referral to re-establish with Sports Medicine, you should get an appointment soon, if needed you can call over there in the next 1-2 weeks if not heard - Take Meloxicam (Mobic) 7.41m2maily - one pill daily with food / water for next 1 month or until you see Sports Med - you can take  Recommend to start taking Tylenol Extra Strength 500m39mbs - take 1 to 2 tabs (max 1000mg63m dose) every 6 hours for pain (take regularly, don't skip a dose for next 3-7 days), max 24 hour daily dose is 6 to 8 tablets or 3000 to 4000mg 68mFor sleep - take Trazodone as prescribed  Please schedule a follow-up appointment with Dr. KaramaParks Rangerout 6 weeks as needed for follow-up BPPV Vertigo, Foot / Ankle pain  If you have any other questions or concerns, please feel free to call the clinic to contact me. You may also schedule an earlier appointment if necessary.  However, if your symptoms get significantly worse, please go to the Emergency Department to seek immediate medical attention.  [name]   Benign Positional Vertigo Vertigo is the feeling that you or your surroundings are moving when they are not. Benign positional vertigo is the most common form of vertigo. The cause of this condition is not serious (is benign). This condition is triggered by certain movements and positions (is positional). This condition can be dangerous if it occurs while you are doing something that could endanger you or others, such as driving.  CAUSES In many cases, the cause of this  condition is not known. It may be caused by a disturbance in an area of the inner ear that helps your brain to sense movement and balance. This disturbance can be caused by a viral infection (labyrinthitis), head injury, or repetitive motion. RISK FACTORS This condition is more likely to develop in:  Women.  People who are 50 yea34 of age or older. SYMPTOMS Symptoms of this condition usually happen when you move your head or your eyes in different directions. Symptoms may start suddenly, and they usually last for less than a minute. Symptoms may include:  Loss of balance and falling.  Feeling like you are spinning or moving.  Feeling like your surroundings are spinning or moving.  Nausea and vomiting.  Blurred vision.  Dizziness.  Involuntary eye movement (nystagmus). Symptoms can be mild and cause only slight annoyance, or they can be severe and interfere with daily life. Episodes of benign positional vertigo may return (recur) over time, and they may be triggered by certain movements. Symptoms may improve over time. DIAGNOSIS This condition is usually diagnosed by medical history and a physical exam of the head, neck, and ears. You may be referred to a health care provider who specializes in ear, nose, and throat (ENT) problems (otolaryngologist) or a provider who specializes in disorders of the nervous system (neurologist). You may have additional testing, including:  MRI.  A CT scan.  Eye movement tests. Your health care provider  may ask you to change positions quickly while he or she watches you for symptoms of benign positional vertigo, such as nystagmus. Eye movement may be tested with an electronystagmogram (ENG), caloric stimulation, the Dix-Hallpike test, or the roll test.  An electroencephalogram (EEG). This records electrical activity in your brain.  Hearing tests. TREATMENT Usually, your health care provider will treat this by moving your head in specific positions  to adjust your inner ear back to normal. Surgery may be needed in severe cases, but this is rare. In some cases, benign positional vertigo may resolve on its own in 2-4 weeks. HOME CARE INSTRUCTIONS Safety  Move slowly.Avoid sudden body or head movements.  Avoid driving.  Avoid operating heavy machinery.  Avoid doing any tasks that would be dangerous to you or others if a vertigo episode would occur.  If you have trouble walking or keeping your balance, try using a cane for stability. If you feel dizzy or unstable, sit down right away.  Return to your normal activities as told by your health care provider. Ask your health care provider what activities are safe for you. General Instructions  Take over-the-counter and prescription medicines only as told by your health care provider.  Avoid certain positions or movements as told by your health care provider.  Drink enough fluid to keep your urine clear or pale yellow.  Keep all follow-up visits as told by your health care provider. This is important. SEEK MEDICAL CARE IF:  You have a fever.  Your condition gets worse or you develop new symptoms.  Your family or friends notice any behavioral changes.  Your nausea or vomiting gets worse.  You have numbness or a "pins and needles" sensation. SEEK IMMEDIATE MEDICAL CARE IF:  You have difficulty speaking or moving.  You are always dizzy.  You faint.  You develop severe headaches.  You have weakness in your legs or arms.  You have changes in your hearing or vision.  You develop a stiff neck.  You develop sensitivity to light.   This information is not intended to replace advice given to you by your health care provider. Make sure you discuss any questions you have with your health care provider.   Document Released: 11/25/2005 Document Revised: 11/08/2014 Document Reviewed: 06/12/2014 Elsevier Interactive Patient Education [employer].

## 2014-12-19 NOTE — Assessment & Plan Note (Signed)
Chronic problem, without acute worsening or flare - Some additional pains in lower extremities, may not be related - Difficulty sleeping with pain  Plan: 1. Refill Trazodone 34m - take half tab 27mto whole 5019mhs prn 2. Continue Effexor 3. Trial on Meloxicam 7.5mg41mr likely MSK pain in LE, future consider alternative agents in addition to Effexor if not providing relief

## 2014-12-26 ENCOUNTER — Encounter: Payer: Self-pay | Admitting: Family Medicine

## 2014-12-26 ENCOUNTER — Ambulatory Visit (INDEPENDENT_AMBULATORY_CARE_PROVIDER_SITE_OTHER): Payer: Medicaid Other | Admitting: Family Medicine

## 2014-12-26 VITALS — BP 134/88 | Ht 65.0 in | Wt 200.0 lb

## 2014-12-26 DIAGNOSIS — M76829 Posterior tibial tendinitis, unspecified leg: Secondary | ICD-10-CM

## 2014-12-26 DIAGNOSIS — M7741 Metatarsalgia, right foot: Secondary | ICD-10-CM

## 2014-12-27 NOTE — Progress Notes (Signed)
  Amanda Dickerson - 54 y.o. female MRN [id number]  Date of birth: [birth year]  CC: Right foot pain x 2 weeks.   SUBJECTIVE:   HPI  Pleasant 54 yo female who presents with 2 weeks of right foot pain.  - Initially began acutely and she was unable to walk x 4 days.  - It has slowly improved and now is just aching.   - Pain is constant - Taking 7.5 mg mobic.   - She is also having b/l medial foot pain consistent with PT tendinitis, which was last evaluated for in 9/14. She is no longer doing her exercises. - She is unable to take NTG due to a history of migraines.    ROS:     10 point RoS negative other than that listed in HPI.    HISTORY: Past Medical, Surgical, Social, and Family History Reviewed & Updated per EMR.    OBJECTIVE: BP 134/88 mmHg  Ht 5' 5"  (1.651 m)  Wt 200 lb (90.719 kg)  BMI 33.28 kg/m2  Physical Exam  Patient in no acute distress.The patient appeared well nourished and normally developed. Resp: Non labored Skin: No rash MSK:  B/l ankle: Full range of motion in the ankle without increase laxity.  Strength is 5 out of 5 in dorsiflexion, plantar flexion, inversion, eversion without pain.  Patient unable to do heel raise due to pain. She is focally tender just off the inferior and posterior aspect of the medial malleolus in the area of the right posterior tibialis tendon.  Minimal tenderness on plantar aspect of 2nd MT head.  No dorsal tenderness or MT shaft tenderness. Negative Squeeze.   MEDICATIONS, LABS & OTHER ORDERS: Previous Medications   CYCLOBENZAPRINE (FLEXERIL) 10 MG TABLET    Take 0.5-1 tablets (5-10 mg total) by mouth 3 (three) times daily as needed.   HYDROCORTISONE OINTMENT 0.5 %    Apply 1 application topically 2 (two) times daily.   MECLIZINE (ANTIVERT) 25 MG TABLET    Take 1 tablet (25 mg total) by mouth 3 (three) times daily as needed for dizziness.   MELOXICAM (MOBIC) 7.5 MG TABLET    Take 1 tablet (7.5 mg total) by mouth daily.   OMEPRAZOLE  (PRILOSEC) 20 MG CAPSULE    Take 1 capsule (20 mg total) by mouth daily.   PROAIR HFA 108 (90 BASE) MCG/ACT INHALER    INHALE 2 PUFFS INTO THE LUNGS EVERY 6 (SIX) HOURS AS NEEDED FOR WHEEZING.   TRAZODONE (DESYREL) 50 MG TABLET    Take 0.5-1 tablets (25-50 mg total) by mouth at bedtime as needed for sleep.   VALACYCLOVIR (VALTREX) 1000 MG TABLET    Take 1 tablet (1,000 mg total) by mouth 2 (two) times daily.   VALACYCLOVIR (VALTREX) 500 MG TABLET    Take 1 tablet (500 mg total) by mouth 2 (two) times daily. For 3 days at first sign of recurrence.   VENLAFAXINE (EFFEXOR) 75 MG TABLET    TAKE 1 TABLET (75 MG TOTAL) BY MOUTH 2 (TWO) TIMES DAILY.   Modified Medications   No medications on file   New Prescriptions   No medications on file   Discontinued Medications   No medications on file  No orders of the defined types were placed in this encounter.   ASSESSMENT & PLAN: See problem based charting & AVS for pt instructions.

## 2014-12-27 NOTE — Assessment & Plan Note (Signed)
Agree with PCP that this is consistent with metatarsalgia.  With place meta tarsal pads on both feet.  - f/u for orthotics as listed in PT A&P.

## 2014-12-27 NOTE — Assessment & Plan Note (Signed)
Agree with recurrent PT tendinitis, b/l.  Unable to use nitro patches d/t migraine history.  - New green insoles with scaphoid pads.  - Discussed doing Eccentric heel raises with heel rotated externally.  - Continue with mobic per PCP - f/u 3-4 weeks for orthotics.

## 2015-01-11 ENCOUNTER — Telehealth: Payer: Self-pay | Admitting: *Deleted

## 2015-01-11 NOTE — Telephone Encounter (Signed)
Patient called stating she has had only one good bowel movement since her last office 12/19/2014.  She was complaining of stomach problems at that visit.  When she wipes there is a small amount of bright red blood.  Patient has tried miralax, with no relief.  Appointment scheduled for tomorrow with same day provider. Derl Barrow, RN

## 2015-01-12 ENCOUNTER — Encounter: Payer: Self-pay | Admitting: Family Medicine

## 2015-01-12 ENCOUNTER — Ambulatory Visit (INDEPENDENT_AMBULATORY_CARE_PROVIDER_SITE_OTHER): Payer: Medicaid Other | Admitting: Family Medicine

## 2015-01-12 VITALS — BP 130/75 | HR 76 | Temp 98.5°F | Wt 202.6 lb

## 2015-01-12 DIAGNOSIS — K5900 Constipation, unspecified: Secondary | ICD-10-CM | POA: Diagnosis not present

## 2015-01-12 DIAGNOSIS — Z23 Encounter for immunization: Secondary | ICD-10-CM | POA: Diagnosis not present

## 2015-01-12 DIAGNOSIS — R194 Change in bowel habit: Secondary | ICD-10-CM | POA: Diagnosis not present

## 2015-01-12 LAB — HEMOCCULT GUIAC POC 1CARD (OFFICE): Fecal Occult Blood, POC: NEGATIVE

## 2015-01-12 MED ORDER — TGT SALINE LAXATIVE RE ENEM: 1.0000 | ENEMA | Freq: Once | RECTAL | Status: DC

## 2015-01-12 NOTE — Assessment & Plan Note (Addendum)
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 Her symptoms prior to getting that coloscopy seems similar to today's presentation. She has no active abdominal pain and positive bowel sounds Most likely symptoms from today are a recurrence of the pathology seen on her colonoscopy exam Possible for IBS given other comorbidities of fibromyalgia - Referral made to GI - Advised that she continue Mira lax at 1 cup daily and titrate up tilt taking 2 full caps or achieves normal bowel movement - Sent a fleets enema 1 - Given indications for more emergent care and follow-up - Discussed with Dr. Lindell Noe

## 2015-01-12 NOTE — Patient Instructions (Signed)
Thank you for coming in,   I have made a referral to GI.  Please return if you start having any nausea, vomiting, fevers, or severe abdominal pain.  Sign up for My Chart to have easy access to your labs results, and communication with your Primary care physician   Please feel free to call with any questions or concerns at any time, at [phone]. --Dr. Raeford Razor

## 2015-01-12 NOTE — Progress Notes (Signed)
   Subjective:    Patient ID: Amanda Dickerson, female    DOB: 1960-03-17, 54 y.o.   MRN: [id number]  Seen for Same day visit for   CC: constipation   Not having a regular bowel movements since Oct 18 Reported one BM in last two weeks.  Watery BM and some hematochezia. Noticed small specks of blood on napkin Took a suppository last night with no relief  Taking ativia and prune juice.  Taking miralax but not everyday.   No nausea or vomiting, fevers, chills.  Hasn't been feeling hungry Drinking normally.  Having no pain.    Review of Systems   See HPI for ROS. Objective:  BP 130/75 mmHg  Pulse 76  Temp(Src) 98.5 F (36.9 C) (Oral)  Wt 202 lb 9.6 oz (91.899 kg)  General: NAD Abdomen: soft, nontender, nondistended, no hepatic or splenomegaly. Bowel sounds present Extremities: no edema WWP. 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 Her symptoms prior to getting that coloscopy seems similar to today's presentation. She has no active abdominal pain and positive bowel sounds Most likely symptoms from today are a recurrence of the pathology seen on her colonoscopy exam Possible for IBS given other comorbidities of fibromyalgia - Referral made to GI - Advised that she continue Mira lax at 1 cup daily and titrate up tilt taking 2 full caps or achieves normal bowel movement - Sent a fleets enema 1 - Given indications for more emergent care and follow-up - Discussed with Dr. Lindell Noe

## 2015-01-19 ENCOUNTER — Other Ambulatory Visit: Payer: Self-pay | Admitting: Family Medicine

## 2015-01-19 DIAGNOSIS — B009 Herpesviral infection, unspecified: Secondary | ICD-10-CM

## 2015-01-30 ENCOUNTER — Encounter: Payer: Self-pay | Admitting: Family Medicine

## 2015-01-30 ENCOUNTER — Ambulatory Visit (INDEPENDENT_AMBULATORY_CARE_PROVIDER_SITE_OTHER): Payer: Medicaid Other | Admitting: Family Medicine

## 2015-01-30 VITALS — BP 118/82 | Ht 65.0 in | Wt 202.0 lb

## 2015-01-30 DIAGNOSIS — M76829 Posterior tibial tendinitis, unspecified leg: Secondary | ICD-10-CM | POA: Diagnosis not present

## 2015-01-31 NOTE — Assessment & Plan Note (Signed)
Mrs. 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 She did not receive her scaphoid pads for her green sports insoles at the last visit. We added scaphoid pads to her insoles today.  - She will continue her eccentric exercises - Continued mobic. - She will see how scaphoid pads feel. If she is doing well in two weeks then she will call to make an appointment for orthotic instruction. - She may remove her metatarsal pads if her shoes feel too cramped. - Follow up in six weeks.

## 2015-01-31 NOTE — Progress Notes (Signed)
Amanda Dickerson - 54 y.o. female MRN [id number]  Date of birth: [birth year]  CC:  Foot pain follow up  SUBJECTIVE:   HPI  Amanda Dickerson is a very pleasant 54 year old female Here for follow-up of her right foot pain. - She was last seen on 12/26/2014. She was diagnosed with posterior tibial tendinitis bilaterally as well as metatarsalgia right. -  She was provided with green sports insoles with metatarsal pads. - Unfortunately she's also supposed to have to have scaphoid pads placed, but these were not added.  - She reports wearing the insoles the majority of the time.  - Her forefoot pain on the right side is now resolved. - Unfortunately she's still hurting posterior tibial tendinitis pain just not surprising considering she did not have scaphoid pads added. - She is trying to do her exercises. - as a reminder she is unable to use nitroglycerin patches due to a history of migraines.   ROS:     10 point RoS negative other than that listed in HPI.  HISTORY: Past Medical, Surgical, Social, and Family History Reviewed & Updated per EMR.    OBJECTIVE: BP 118/82 mmHg  Ht 5' 5"  (1.651 m)  Wt 202 lb (91.627 kg)  BMI 33.61 kg/m2  Physical Exam  Patient in no acute distress.The patient appeared well nourished and normally developed. Resp: Non labored Skin: No rash MSK:  B/l ankle: Full range of motion in the ankle without increase laxity.  Strength is 5 out of 5 in dorsiflexion, plantar flexion, inversion, eversion without pain.  Patient unable to do heel raise due to pain. She is focally tender just off the inferior and posterior aspect of the medial malleolus in the area of the b/l posterior tibialis tendon.  No tenderness on plantar aspect of 2nd MT head on the right. No dorsal tenderness or MT shaft tenderness. Negative Squeeze.  Pes Planus with pronated stance.   MEDICATIONS, LABS & OTHER ORDERS: Previous Medications   CYCLOBENZAPRINE (FLEXERIL) 10 MG TABLET    Take 0.5-1  tablets (5-10 mg total) by mouth 3 (three) times daily as needed.   FLUOCINOLONE ACETONIDE 0.01 % OIL    INSTILL 5 DROPS TWICE DAILY INTO BOTH EARS FOR TWO WEEKS. USE AS NEEDED.   HYDROCORTISONE OINTMENT 0.5 %    Apply 1 application topically 2 (two) times daily.   MECLIZINE (ANTIVERT) 25 MG TABLET    Take 1 tablet (25 mg total) by mouth 3 (three) times daily as needed for dizziness.   MELOXICAM (MOBIC) 7.5 MG TABLET    Take 1 tablet (7.5 mg total) by mouth daily.   OMEPRAZOLE (PRILOSEC) 20 MG CAPSULE    Take 1 capsule (20 mg total) by mouth daily.   PROAIR HFA 108 (90 BASE) MCG/ACT INHALER    INHALE 2 PUFFS INTO THE LUNGS EVERY 6 (SIX) HOURS AS NEEDED FOR WHEEZING.   SODIUM PHOSPHATES (TGT SALINE LAXATIVE) ENEM    Place 1 Bottle rectally once.   TRAZODONE (DESYREL) 50 MG TABLET    Take 0.5-1 tablets (25-50 mg total) by mouth at bedtime as needed for sleep.   VALACYCLOVIR (VALTREX) 500 MG TABLET    TAKE 1 TABLET (500 MG TOTAL) BY MOUTH 2 (TWO) TIMES DAILY. FOR 3 DAYS AT FIRST SIGN OF RECURRENCE.   VENLAFAXINE (EFFEXOR) 75 MG TABLET    TAKE 1 TABLET (75 MG TOTAL) BY MOUTH 2 (TWO) TIMES DAILY.   Modified Medications   No medications on file   New Prescriptions  No medications on file   Discontinued Medications   No medications on file  No orders of the defined types were placed in this encounter.   ASSESSMENT & PLAN: See problem based charting & AVS for pt instructions.

## 2015-02-14 ENCOUNTER — Other Ambulatory Visit: Payer: Self-pay | Admitting: Gastroenterology

## 2015-02-14 ENCOUNTER — Encounter: Payer: Self-pay | Admitting: Gastroenterology

## 2015-02-14 ENCOUNTER — Other Ambulatory Visit: Payer: Self-pay | Admitting: Family Medicine

## 2015-02-14 ENCOUNTER — Ambulatory Visit (INDEPENDENT_AMBULATORY_CARE_PROVIDER_SITE_OTHER): Payer: Medicaid Other | Admitting: Gastroenterology

## 2015-02-14 VITALS — BP 108/76 | HR 66 | Ht 65.0 in | Wt 202.4 lb

## 2015-02-14 DIAGNOSIS — K5900 Constipation, unspecified: Secondary | ICD-10-CM | POA: Diagnosis not present

## 2015-02-14 DIAGNOSIS — K625 Hemorrhage of anus and rectum: Secondary | ICD-10-CM

## 2015-02-14 DIAGNOSIS — R232 Flushing: Secondary | ICD-10-CM

## 2015-02-14 NOTE — Patient Instructions (Signed)
You can start Miralax over the counter mixing 17 grams in 8 oz of water 1-2 x daily for constipation.   Your follow up appt with Dr. Fuller Plan is on 03/23/15 at 2:15pm.   Thank you for choosing me and [facility].  Pricilla Riffle. Dagoberto Ligas., MD., Marval Regal

## 2015-02-14 NOTE — Progress Notes (Signed)
    History of Present Illness: This is a 54 year old female who relates problems with constipation and small amounts of blood and mucus per rectum for the past few weeks. She previously underwent colonoscopy in 10/2013 which showed a nonspecific proctosigmoiditis and small internal hemorrhoids. She's tried several laxatives and enemas there were initially effective but her constipation returned. She took MiraLAX for 1 week on a daily basis and it was effective but she discontinued it.   Current Medications, Allergies, Past Medical History, Past Surgical History, Family History and Social History were reviewed in [employer] record.  Physical Exam: General: Well developed, well nourished, no acute distress Head: Normocephalic and atraumatic Eyes:  sclerae anicteric, EOMI Ears: Normal auditory acuity Mouth: No deformity or lesions Lungs: Clear throughout to auscultation Heart: Regular rate and rhythm; no murmurs, rubs or bruits Abdomen: Soft, non tender and non distended. No masses, hepatosplenomegaly or hernias noted. Normal Bowel sounds Musculoskeletal: Symmetrical with no gross deformities  Pulses:  Normal pulses noted Extremities: No clubbing, cyanosis, edema or deformities noted Neurological: Alert oriented x 4, grossly nonfocal Psychological:  Alert and cooperative. Normal mood and affect  Assessment and Recommendations:  1. Constipation and benign anal rectal bleeding likely secondary to known internal hemorrhoids. MiraLAX once or twice daily titrated for adequate bowel movements. They use over-the-counter Preparation H suppositories daily for rectal bleeding persists. Return office visit one month.

## 2015-02-15 ENCOUNTER — Other Ambulatory Visit: Payer: Self-pay | Admitting: Family Medicine

## 2015-02-15 ENCOUNTER — Telehealth: Payer: Self-pay | Admitting: Family Medicine

## 2015-02-15 DIAGNOSIS — M797 Fibromyalgia: Secondary | ICD-10-CM

## 2015-02-15 MED ORDER — CYCLOBENZAPRINE HCL 10 MG PO TABS: ORAL_TABLET | ORAL | Status: DC

## 2015-02-15 NOTE — Telephone Encounter (Signed)
Pt called and would like to speak to Dr. Parks Ranger about writing her a letter about her disabilities so that she can get in a program that will help pay her mortgage. jw

## 2015-02-16 NOTE — Telephone Encounter (Signed)
Reviewed her chart, it seems the primary diagnosis that could be related to disability would be Fibromyalgia. I will need more information about this and also need to know more details on the program that she is trying to get into, to see what requirements there are.  I am post-call today. Will forward this message to [facility] Red Team to see if the patient could be called today 12/16 to gather some more details to help me further evaluate this, and then I can call her next week to discuss further.  If you could ask her to: 1. Define her disabilities - (which diagnoses is she referring to) and how do they limit her with daily activity 2. What is the program and is there an application or criteria that I need to review first before making this decision?  Thank you.  [name], [place], PGY-3

## 2015-02-21 NOTE — Telephone Encounter (Signed)
Spoke with patient she states the diagnosis is Fibromyalgia, states she cant sit or walk for long periods of time without extreme pain and this affects her ability to work. Also has trouble sleeping at night due to pain. She states that MD would just need to write a letter on our letterhead stating this for her to take to the [employer]. Patient states that with this letter she hopes to have some assistance paying her mortgage. Will forward to PCP.

## 2015-02-22 NOTE — Telephone Encounter (Signed)
Reviewed this note, and will address writing this letter for patient next week after 12/27.  [name], [place], PGY-3

## 2015-02-27 ENCOUNTER — Encounter: Payer: Self-pay | Admitting: Family Medicine

## 2015-02-27 NOTE — Telephone Encounter (Signed)
Letter written today 12/27. Called patient, she will pick letter up from [facility] later this week. It will be available for pick-up in front office. If any future edits need to be made she will notify us.  [name], [place], PGY-3

## 2015-03-07 ENCOUNTER — Ambulatory Visit: Payer: Medicaid Other | Admitting: Family Medicine

## 2015-03-23 ENCOUNTER — Ambulatory Visit (INDEPENDENT_AMBULATORY_CARE_PROVIDER_SITE_OTHER): Payer: Medicaid Other | Admitting: Gastroenterology

## 2015-03-23 ENCOUNTER — Encounter: Payer: Self-pay | Admitting: Gastroenterology

## 2015-03-23 VITALS — BP 116/78 | HR 72 | Ht 65.0 in | Wt 206.1 lb

## 2015-03-23 DIAGNOSIS — K5900 Constipation, unspecified: Secondary | ICD-10-CM | POA: Diagnosis not present

## 2015-03-23 NOTE — Patient Instructions (Signed)
Take over the counter Miralax mixing 17 grams in 8 oz of water 1-2 x daily for constipation.  Thank you for choosing me and [facility].  Pricilla Riffle. Dagoberto Ligas., MD., Marval Regal

## 2015-03-23 NOTE — Progress Notes (Signed)
    History of Present Illness: This is a 55 year old female returning for follow-up of constipation. Using MiraLAX on a daily basis she has a bowel movement about every 2-3 days. No rectal bleeding since she has been using MiraLAX regularly. Her constipation symptoms have improved substantially.  Current Medications, Allergies, Past Medical History, Past Surgical History, Family History and Social History were reviewed in [employer] record.  Physical Exam: General: Well developed, well nourished, no acute distress Head: Normocephalic and atraumatic Eyes:  sclerae anicteric, EOMI Ears: Normal auditory acuity Mouth: No deformity or lesions Lungs: Clear throughout to auscultation Heart: Regular rate and rhythm; no murmurs, rubs or bruits Abdomen: Soft, non tender and non distended. No masses, hepatosplenomegaly or hernias noted. Normal Bowel sounds Musculoskeletal: Symmetrical with no gross deformities  Pulses:  Normal pulses noted Extremities: No clubbing, cyanosis, edema or deformities noted Neurological: Alert oriented x 4, grossly nonfocal Psychological:  Alert and cooperative. Normal mood and affect  Assessment and Recommendations:  1. Constipation. MiraLAX 1-2 times daily titrated for adequate bowel movements. Follow-up as needed.  2. Internal hemorrhoids. Currently asymptomatic.

## 2015-06-03 ENCOUNTER — Other Ambulatory Visit: Payer: Self-pay | Admitting: Family Medicine

## 2015-06-03 DIAGNOSIS — R232 Flushing: Secondary | ICD-10-CM

## 2015-06-27 ENCOUNTER — Other Ambulatory Visit: Payer: Self-pay | Admitting: Family Medicine

## 2015-06-27 DIAGNOSIS — J4520 Mild intermittent asthma, uncomplicated: Secondary | ICD-10-CM

## 2015-07-26 ENCOUNTER — Encounter: Payer: Self-pay | Admitting: Family Medicine

## 2015-07-26 ENCOUNTER — Ambulatory Visit (INDEPENDENT_AMBULATORY_CARE_PROVIDER_SITE_OTHER): Payer: Medicaid Other | Admitting: Family Medicine

## 2015-07-26 VITALS — BP 131/78 | HR 68 | Temp 98.0°F | Wt 202.8 lb

## 2015-07-26 DIAGNOSIS — E669 Obesity, unspecified: Secondary | ICD-10-CM | POA: Diagnosis not present

## 2015-07-26 DIAGNOSIS — E785 Hyperlipidemia, unspecified: Secondary | ICD-10-CM | POA: Diagnosis not present

## 2015-07-26 DIAGNOSIS — Z1159 Encounter for screening for other viral diseases: Secondary | ICD-10-CM | POA: Diagnosis not present

## 2015-07-26 DIAGNOSIS — R7303 Prediabetes: Secondary | ICD-10-CM | POA: Diagnosis not present

## 2015-07-26 DIAGNOSIS — B009 Herpesviral infection, unspecified: Secondary | ICD-10-CM

## 2015-07-26 DIAGNOSIS — R42 Dizziness and giddiness: Secondary | ICD-10-CM

## 2015-07-26 DIAGNOSIS — Z0000 Encounter for general adult medical examination without abnormal findings: Secondary | ICD-10-CM

## 2015-07-26 DIAGNOSIS — L819 Disorder of pigmentation, unspecified: Secondary | ICD-10-CM | POA: Insufficient documentation

## 2015-07-26 DIAGNOSIS — R7309 Other abnormal glucose: Secondary | ICD-10-CM

## 2015-07-26 DIAGNOSIS — H8190 Unspecified disorder of vestibular function, unspecified ear: Secondary | ICD-10-CM | POA: Insufficient documentation

## 2015-07-26 LAB — BASIC METABOLIC PANEL WITH GFR
BUN: 11 mg/dL (ref 7–25)
CALCIUM: 9 mg/dL (ref 8.6–10.4)
CHLORIDE: 106 mmol/L (ref 98–110)
CO2: 24 mmol/L (ref 20–31)
CREATININE: 0.72 mg/dL (ref 0.50–1.05)
GFR, Est African American: >89 mL/min (ref >=60)
GFR, Est Non African American: >89 mL/min (ref >=60)
GLUCOSE: 121 mg/dL — AB (ref 65–99)
Potassium: 3.9 mmol/L (ref 3.5–5.3)
SODIUM: 140 mmol/L (ref 135–146)

## 2015-07-26 LAB — LIPID PANEL
CHOL/HDL RATIO: 3.4 ratio (ref <=5.0)
Cholesterol: 226 mg/dL — ABNORMAL HIGH (ref 125–200)
HDL: 67 mg/dL (ref >=46)
LDL CALC: 131 mg/dL — AB (ref <130)
Triglycerides: 140 mg/dL (ref <150)
VLDL: 28 mg/dL (ref <30)

## 2015-07-26 LAB — POCT GLYCOSYLATED HEMOGLOBIN (HGB A1C): Hemoglobin A1C: 6.4

## 2015-07-26 NOTE — Progress Notes (Signed)
Subjective:    Patient ID: Amanda Dickerson, female    DOB: Nov 08, 1960, 55 y.o.   MRN: [id number]  Amanda Dickerson is a 55 y.o. female presenting on 07/26/2015 for Annual Exam; Dizziness; and Rash   HPI   EPISODIC DIZZINESS / LIGHTHEADEDNESS / PRE-SYNCOPE: - Last seen at [facility] for same complaint variety of times previously 07/2014 thought BPPV, again in 12/2014 by me, again thought strong component of BPPV given description of "spinning", she was previously followed by [facility]NT, she was referred back to have vestibular testing, audiology performed [place] p[place] negative, thought not to be BPPV or inner ear related, concern for vestibular migraine, given referral to Neurology (Dr Catalina Gravel), patient did not establish with them yet. - Today reports this problem is essentially unchanged from before still with some episodic dizziness and spinning as before, now also worsening to have some feelings of lightheadedness and feeling "woozy" with a few episodes, related to sunlight possibly as usually upon going outside if sunny, concerned may be related to migraines (but no active HA) as did have a photosensitivity or light triggering component to prior migraines (which now have been better controlled, only 1 q 3 or so months) - Patient also concerned if this could be related to blood sugar, does not test no prior DM - Family history of Vertigo (Mother) - Denies syncope, chest pain, dyspnea, HA, nausea, vomiting  OBESITY / H/o Elevated Glucose - Weight stable over past 6 months without gain or loss 202 lb - Does not adhere to particular diet or exercise program - Family history of diabetes (father, grandparents), sister with pre-diabetes  HYPERLIPIDEMIA: - Last lipid panel 07/2014 with elevated TG 150s, elevated LDL 110s, good HDL 64 - Fasting today to check lipids - Not on statin therapy  RIGHT HAND HYPERPIGMENTATION: - Reports new complaint of hyperpigmented skin in small area between thumb and index  finger, present for >6 months, no injury or trauma, limited sun to this area, asymptomatic without itching or pain. No personal or family history of skin cancer  HSV2 - Chronic stable. No outbreaks >6 months. No acyclovir needed.  HM: - Due for Hep C Screening - Up to date on Mammogram (last done 05/2014, normal, last pap smear 08/2013 negative malignancy and negative high risk HPV good for 3-5 years, last colonoscopy 10/2013   Social History  Substance Use Topics  . Smoking status: Never Smoker   . Smokeless tobacco: Never Used  . Alcohol Use: Yes     Comment: occasionally    Review of Systems Per HPI unless specifically indicated above     Objective:    BP 131/78 mmHg  Pulse 68  Temp(Src) 98 F (36.7 C) (Oral)  Wt 202 lb 12.8 oz (91.989 kg)  Wt Readings from Last 3 Encounters:  07/26/15 202 lb 12.8 oz (91.989 kg)  03/23/15 206 lb 2 oz (93.498 kg)  02/14/15 202 lb 6.4 oz (91.808 kg)    Physical Exam  Constitutional: She appears well-developed and well-nourished. No distress.  Obese, well appearing, comfortable, cooperative  HENT:  Head: Normocephalic and atraumatic.  Mouth/Throat: Oropharynx is clear and moist.  Eyes: EOM are normal.  Neck: Normal range of motion. Neck supple. No thyromegaly present.  Cardiovascular: Normal rate, regular rhythm, normal heart sounds and intact distal pulses.   No murmur heard. Pulmonary/Chest: Effort normal and breath sounds normal. No respiratory distress. She has no wheezes. She has no rales.  Abdominal: Soft. Bowel sounds are normal. She  exhibits no distension and no mass. There is no tenderness.  Musculoskeletal: She exhibits no edema.  Neurological: She is alert.  Skin: Skin is warm and dry. She is not diaphoretic.  Right hand dorsal webspace between thumb and index finger with about 2 x 2 cm area of hyperpigmentation with other area < 1 cm slightly darker / minimal erythematous appearance. Skin appears normal without rash or raised  lesion. Non-tender. Not rough. See picture  Nursing note and vitals reviewed.    Right hand pictured       Assessment & Plan:   Problem List Items Addressed This Visit    Pre-diabetes    Prior elevated fasting glucose on chemistry 116 last year, concern given obesity, HLD, at risk for DM, also fam history. No prior A1c. - Check chemistry today, elevated glucose 121 (fasting) - Check A1c 6.4 today (no comparison, concern Pre-DM borderline DM), will re-check in 2 weeks - Discuss metformin therapy      Obesity    Weight stable Counsel on lifestyle diet and exercise      Relevant Orders   BASIC METABOLIC PANEL WITH GFR (Completed)   POCT glycosylated hemoglobin (Hb A1C) (Completed)   Lipid panel (Completed)   Hyperpigmentation of skin    Right hand, dorsal web between thumb and index finger, persistent 2 x 2 area mild hyperpigmentation, not consistent with mole, no actual raised skin lesion. Likely age related / sun exposure. - Observation and follow clinically, pictured on chart today for reference      Hyperlipidemia    Concern for metabolic syndrome risk with obesity, inc TG and LDL. 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 Most likely neurological related especially with some feelings more of  lightheaded / pre-syncope. Does not seem consistent with orthostasis by history and prior negative cardiac work-up within 1 year.  Plan: 1. Advised patient to follow-up with Neurology Dr Michel Santee ([facility] [phone]) on card given by ENT last visit, specializing in headaches and dizziness. In future if needs referral to other local neurology will provide for 2nd opinion if needed 2. Return precautions given      Relevant Orders   BASIC METABOLIC PANEL WITH GFR (Completed)    Other Visit Diagnoses    Annual physical exam    -  Primary    Need for hepatitis C screening test        Relevant Orders    Hepatitis C antibody (Completed)       No orders of the defined types were placed in this encounter.      Follow up plan: Return in about 4 weeks (around 08/23/2015), or if symptoms worsen or fail to improve, for lightheadedness.  Sheppard Coil  Parks Ranger, [place], PGY-3

## 2015-07-26 NOTE — Patient Instructions (Signed)
Thank you for coming in to clinic today.  1. Labs today - will follow-up with results, primarily looking for cholesterol and blood sugar with A1c 2. The rash on your Right hand looks like normal skin, do not be alarmed, we have a picture on your chart, it is fine to follow this, may be darkened age spot does not look like cancer. Future biopsy if changing. 3. For dizziness, lightheadedness, recommend scheduling apt to follow-up with that Dr Catalina Gravel to get an opinion if it is related to Vestibular Migraine related to sunlight, otherwise we can refer you to neurology if you choose, let us know if you need referral - Does not sound like the "BPPV Vertigo" you can stop the meclizine  Please schedule a follow-up appointment with Dr Parks Ranger within 4 weeks for Lightheadedness as needed, otherwise follow-up with New Doctor after 3 to 6 months for Cholesterol / Blood Sugar / Weight  If you have any other questions or concerns, please feel free to call the clinic to contact me. You may also schedule an earlier appointment if necessary.  However, if your symptoms get significantly worse, please go to the Emergency Department to seek immediate medical attention.  [name], [place]

## 2015-07-27 LAB — HEPATITIS C ANTIBODY: HCV Ab: NEGATIVE

## 2015-07-27 NOTE — Assessment & Plan Note (Signed)
Weight stable Counsel on lifestyle diet and exercise

## 2015-07-27 NOTE — Assessment & Plan Note (Addendum)
Prior elevated fasting glucose on chemistry 116 last year, concern given obesity, HLD, at risk for DM, also fam history. No prior A1c. - Check chemistry today, elevated glucose 121 (fasting) - Check A1c 6.4 today (no comparison, concern Pre-DM borderline DM), will re-check in 2 weeks - Discuss metformin therapy

## 2015-07-27 NOTE — Assessment & Plan Note (Signed)
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 Most likely neurological related especially with some feelings more of lightheaded / pre-syncope. Does not seem consistent with orthostasis by history and prior negative cardiac work-up within 1 year.  Plan: 1. Advised patient to follow-up with Neurology Dr Michel Santee ([facility] [phone]) on card given by ENT last visit, specializing in headaches and dizziness. In future if needs referral to other local neurology will provide for 2nd opinion if needed 2. Return precautions given

## 2015-07-27 NOTE — Assessment & Plan Note (Signed)
Right hand, dorsal web between thumb and index finger, persistent 2 x 2 area mild hyperpigmentation, not consistent with mole, no actual raised skin lesion. Likely age related / sun exposure. - Observation and follow clinically, pictured on chart today for reference

## 2015-07-27 NOTE — Assessment & Plan Note (Signed)
Concern for metabolic syndrome risk with obesity, inc TG and LDL. No prior known DM / HTN - Check fasting lipids today - results with worsening inc LDL to 130s, HDL remains stable elevated, TG stable 140-150s - ASCVD 10 yr Risk = 1.6% (if count as DM given A1c 6.4% then only up to 3%), at this time statin therapy not indicated unless if patient remains unable to lower LDL in future

## 2015-07-27 NOTE — Assessment & Plan Note (Signed)
Stable, without any outbreaks >6 months - Not on anti viral suppressive therapy - Follow PRN for valacyclovir if outbreak

## 2015-07-31 ENCOUNTER — Encounter: Payer: Medicaid Other | Admitting: Family Medicine

## 2015-08-09 ENCOUNTER — Encounter: Payer: Self-pay | Admitting: Family Medicine

## 2015-08-09 ENCOUNTER — Ambulatory Visit (INDEPENDENT_AMBULATORY_CARE_PROVIDER_SITE_OTHER): Payer: Medicaid Other | Admitting: Family Medicine

## 2015-08-09 VITALS — Ht 65.0 in | Wt 200.0 lb

## 2015-08-09 DIAGNOSIS — R7303 Prediabetes: Secondary | ICD-10-CM | POA: Diagnosis not present

## 2015-08-09 DIAGNOSIS — E669 Obesity, unspecified: Secondary | ICD-10-CM

## 2015-08-09 NOTE — Patient Instructions (Addendum)
Call Dr Jenne Campus [phone]) in mid July to schedule a Nutrition Clinic Visit for August  Diet Recommendations for Diabetes   Starchy (carb) foods: Bread, rice, pasta, potatoes, corn, cereal, grits, crackers, bagels, muffins, all baked goods.  (Fruits, milk, and yogurt also have carbohydrate, but most of these foods will not spike your blood sugar as the starchy foods will.)  A few fruits do cause high blood sugars; use small portions of bananas (limit to 1/2 at a time), grapes, watermelon, oranges, and most tropical fruits.  Protein foods: Meat, fish, poultry, eggs, dairy foods, and beans such as pinto and kidney beans (beans also provide carbohydrate).    1. Eat at least 3 meals and 1-2 snacks per day. Never go more than 4-5 hours while awake without eating. Eat breakfast within the first hour of getting up.   2. Limit starchy foods to TWO per meal and ONE per snack. ONE portion of a starchy  food is equal to the following:   - ONE slice of bread (or its equivalent, such as half of a hamburger bun).   - 1/2 cup of a "scoopable" starchy food such as potatoes or rice.   - 15 grams of carbohydrate as shown on food label.  3. Include at every meal: a protein food, a carb food, and vegetables and/or fruit.   - Obtain twice the volume of veg's as protein or carbohydrate foods for both lunch and dinner.   - Fresh or frozen veg's are best.   - Keep frozen veg's on hand for a quick vegetable serving.  Hyperpalatable Foods ("Very tasty", can get addicted to these "taste preferences") - Sugar - Salt - Fat  Goals: 1. Eat something for lunch + 1-2 snacks daily (healthy snack option - mix your own salty + unsalted sm snack bag, fresh fruits with berries) 2. Limit carb intake (including sweet drink/fruit juice) to 2 servings per meal 3. Keep exercise log (which activity, duration, 5 x weekly) 4. Continue drinking mostly water daily - Keep up good work!  Week of __________ Goals SUN MON TUE WED  THU FRI SAT  3 meals + 1-2 snacks         Limit carb 2 servings meal         Exercise (5 x weekly)          Week of __________ Goals SUN MON TUE WED THU FRI SAT  3 meals + 1-2 snacks         Limit carb 2 servings meal         Exercise (5 x weekly)          Week of __________ Goals SUN MON TUE WED THU FRI SAT  3 meals + 1-2 snacks         Limit carb 2 servings meal         Exercise (5 x weekly)          Week of __________ Goals SUN MON TUE WED THU FRI SAT  3 meals + 1-2 snacks         Limit carb 2 servings meal         Exercise (5 x weekly)          Week of __________ Goals SUN MON TUE WED THU FRI SAT  3 meals + 1-2 snacks         Limit carb 2 servings meal         Exercise (5 x weekly)  Week of __________ Goals SUN MON TUE WED THU FRI SAT  3 meals + 1-2 snacks         Limit carb 2 servings meal         Exercise (5 x weekly)

## 2015-08-09 NOTE — Progress Notes (Signed)
NUTRITION VISIT:  Learning Readiness:   Ready / Change in progress  "Doesn't care about food" Reports fire potato chip eater for years, has stopped. Switched to salads with chicken. Stopped country time spoonful sweetener switched to straight water Legs (pain), worked through with walking and stationary bike  Usual eating pattern on typical day includes 2 meals (breakfast and dinner, skips lunch) and previously 4 snacks per day, now improved diet with 0-1 snacks per day. Frequent foods and beverages on most days include: - Prior to dietary changes: Potatoes (mashed, fries, chips, baked), Meat (steak, pork, chicken), Mixed vegetables frozen w/ margarine. Drink choices were sweet tea, lemonade, added - S/p dietary changes: Chicken salad, Cereal/oatmeal, salads homemade, water    Usual physical activity includes walking (outside at park, brisk) and stationary bike each for 30 min per episode (one of these x 4 weekly), stationary exercises, some exercises everyday  24-hr recall: (Up at  0830 AM) B (0930 AM)- 1 cup cereal (honey bunches of oats) w/ whole milk/water, bottle water     - Skipped lunch and no snacks between breakfast and dinner D (1830 PM)- Chicken salad (homemade, boiled chicken breast, reg mayo, pickle onion) sandwich (whole bun toasted), pre-packaged pop corn in sandwich bag, tea and lemonade mix (sweetened, 1 cup tea, lemonade, water) Typical day? Yes.   (since changed diet over past 2 weeks)  Sleep Habits: - Goes to bed at [place] and fell asleep right away  Patient seen and evaluated with Dr Jenne Campus, set the following goals today 6/8 1. Eat something for lunch + 1-2 snacks daily (healthy snack option - mix your own salty + unsalted sm snack bag, fresh fruits with berries) 2. Limit carb intake (including sweet drink/fruit juice) to 2 servings per meal 3. Keep exercise log (which activity, duration, 5 x weekly) 4. Continue drinking mostly water daily  See AVS for  details  Demonstrated degree of understanding via:  Teach Back  Barriers to learning/adherence to lifestyle change: None - seems very motivated  Monitoring/Evaluation:  Dietary intake, exercise, blood glucose with future A1c check for Pre-DM, and body weight in 2 month(s).  [name], [place], PGY-3

## 2015-08-22 ENCOUNTER — Encounter: Payer: Self-pay | Admitting: Family Medicine

## 2015-08-22 ENCOUNTER — Ambulatory Visit (INDEPENDENT_AMBULATORY_CARE_PROVIDER_SITE_OTHER): Payer: Medicaid Other | Admitting: Family Medicine

## 2015-08-22 VITALS — BP 140/76 | HR 82 | Temp 98.4°F | Ht 65.0 in | Wt 197.8 lb

## 2015-08-22 DIAGNOSIS — R7309 Other abnormal glucose: Secondary | ICD-10-CM

## 2015-08-22 DIAGNOSIS — E669 Obesity, unspecified: Secondary | ICD-10-CM

## 2015-08-22 DIAGNOSIS — R7303 Prediabetes: Secondary | ICD-10-CM | POA: Diagnosis present

## 2015-08-22 LAB — POCT GLYCOSYLATED HEMOGLOBIN (HGB A1C): HEMOGLOBIN A1C: 5.8

## 2015-08-22 NOTE — Patient Instructions (Signed)
Thank you for coming in to clinic today.  1. Congratulations your A1c is down from 6.4 to 5.8 in 1 month. This a great improvement! I imagine after 3 full months of diet / exercise your A1c will be stable to even lower. You are still within Pre Diabetes range (A1c 5.7 to 6.4). And you are at future risk of gradually trending towards Diabetes, so important to keep up the good work. 2. Congratulations on losing almost 3 lbs in 2 weeks as well. 3. No medication at this time. No metformin or cholesterol medication. Will re-discuss in future if needed  Please schedule a follow-up appointment with Dr Jenne Campus in 2 months for [facility] - and Follow-up with New Doctor in 3 months to re-check A1c and meet new doctor  No blood work due until 07/2016  If you have any other questions or concerns, please feel free to call the clinic to contact me. You may also schedule an earlier appointment if necessary.  However, if your symptoms get significantly worse, please go to the Emergency Department to seek immediate medical attention.  [name], [place]

## 2015-08-22 NOTE — Assessment & Plan Note (Addendum)
Significant improvement with re-check A1c 6.4 to 5.8 today (1 month later), following dramatic lifestyle modifications with improved diet and regular exercise. Suspect A1c may go even lower after full 3 months of lifestyle mods. - Prior ASCVD 10 yr risk score about 1.5%, not indicated for statin therapy, especially with improved A1c  Plan: 1. Encouraged continued lifestyle modifications - adhere to goals set in Nutrition Clinic 2. Discussed future possibility of Metformin - decision to hold off on this treatment currently, unless A1c trend gradually rising 3. Follow-up 3 months with new PCP re-check A1c, can space out A1c checks if improved and stable after about 6 months. Return to Nutrition Clinic with Dr Jenne Campus within 2 months

## 2015-08-22 NOTE — Progress Notes (Signed)
Subjective:    Patient ID: Amanda Dickerson, female    DOB: 1960-03-08, 55 y.o.   MRN: [id number]  Amanda Dickerson is a 55 y.o. female presenting on 08/22/2015 for Pre-DM Follow-up  HPI  Pre-Diabetes / OBESITY - Last visit 5/25 for physical had screening A1c with prior elevated glucose result 6.4 concern for new dx pre-DM, 6/8 for nutrition clinic, see nutrition note, briefly patient had made significant dietary and exercise lifestyle improvements - Today presents for repeat A1c to confirm Pre-DM diagnosis. She continues to do well with lifestyle changes, does admit to having a few extra "cheat" days where she ate sweets and desserts at family member graduation and wedding, otherwise she is adhering to her goals including exercise up to 5x weekly. - Good weight loss 3 lbs in 2 weeks, overall down nearly 5 lb in >1 month - Denies polydipsia, polyphagia, fatigue / decreased energy, chest pain, shortness of breath   Social History  Substance Use Topics  . Smoking status: Never Smoker   . Smokeless tobacco: Never Used  . Alcohol Use: Yes     Comment: occasionally    Review of Systems Per HPI unless specifically indicated above     Objective:    BP 140/76 mmHg  Pulse 82  Temp(Src) 98.4 F (36.9 C) (Oral)  Ht 5' 5"  (1.651 m)  Wt 197 lb 12.8 oz (89.721 kg)  BMI 32.92 kg/m2  SpO2 98%  Wt Readings from Last 3 Encounters:  08/22/15 197 lb 12.8 oz (89.721 kg)  08/09/15 200 lb (90.719 kg)  07/26/15 202 lb 12.8 oz (91.989 kg)    Physical Exam  Constitutional: She appears well-developed and well-nourished. No distress.  Obese, well-appearing, comfortable, cooperative  HENT:  Mouth/Throat: Oropharynx is clear and moist.  Cardiovascular: Normal rate and intact distal pulses.   Musculoskeletal: She exhibits no edema.  Skin: Skin is warm and dry. She is not diaphoretic.  Nursing note and vitals reviewed.  Results for orders placed or performed in visit on 08/22/15  POCT glycosylated  hemoglobin (Hb A1C)  Result Value Ref Range   Hemoglobin A1C 5.8       Assessment & Plan:   Problem List Items Addressed This Visit    Pre-diabetes - Primary    Significant improvement with re-check A1c 6.4 to 5.8 today (1 month later), following dramatic lifestyle modifications with improved diet and regular exercise. Suspect A1c may go even lower after full 3 months of lifestyle mods. - Prior ASCVD 10 yr risk score about 1.5%, not indicated for statin therapy, especially with improved A1c  Plan: 1. Encouraged continued lifestyle modifications - adhere to goals set in Nutrition Clinic 2. Discussed future possibility of Metformin - decision to hold off on this treatment currently, unless A1c trend gradually rising 3. Follow-up 3 months with new PCP re-check A1c, can space out A1c checks if improved and stable after about 6 months. Return to Nutrition Clinic with Dr Jenne Campus within 2 months      Obesity    Improved weight loss, down 3 lbs in 1 month. Encouraged to continue lifestyle modifications diet and exercise       Other Visit Diagnoses    Other abnormal glucose        Relevant Orders    POCT glycosylated hemoglobin (Hb A1C) (Completed)       No orders of the defined types were placed in this encounter.      Follow up plan: Return in about 3 months (  around 11/22/2015) for pre diabetes, obesity.  Amanda Dickerson, [place], PGY-3

## 2015-08-22 NOTE — Assessment & Plan Note (Signed)
Improved weight loss, down 3 lbs in 1 month. Encouraged to continue lifestyle modifications diet and exercise

## 2015-10-02 IMAGING — MG MM SCREEN MAMMOGRAM BILATERAL
4 series · 4 of 4 positions shown · non-contrast
Comparison: Previous exam(s).

ACR Breast Density Category a: The breast tissue is almost entirely
fatty.

CLINICAL DATA: Screening.

EXAM:
DIGITAL SCREENING BILATERAL MAMMOGRAM WITH CAD

[R CC]
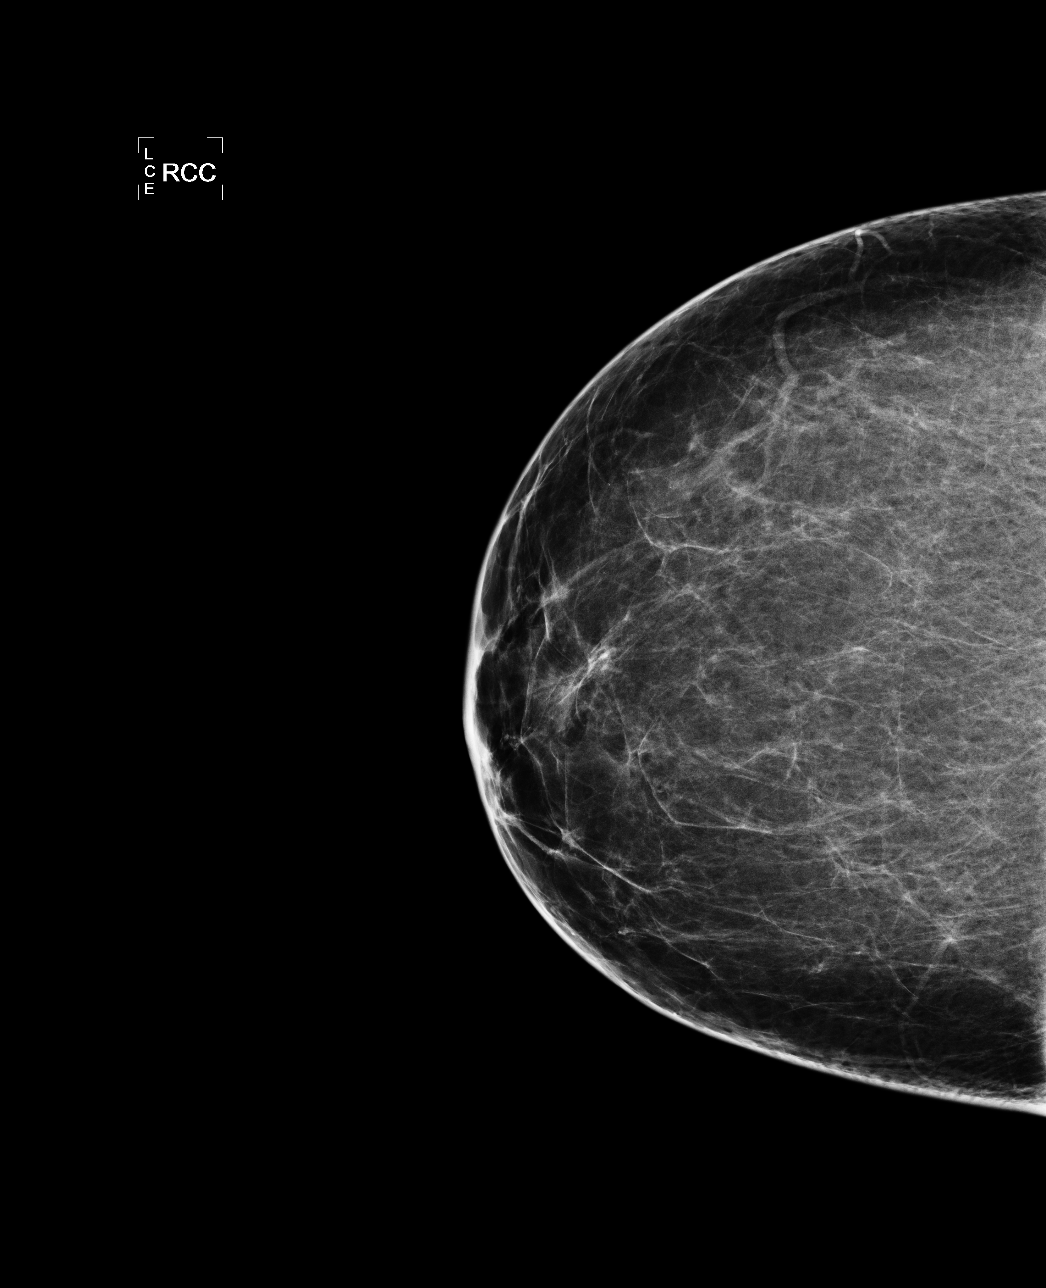

[L CC]
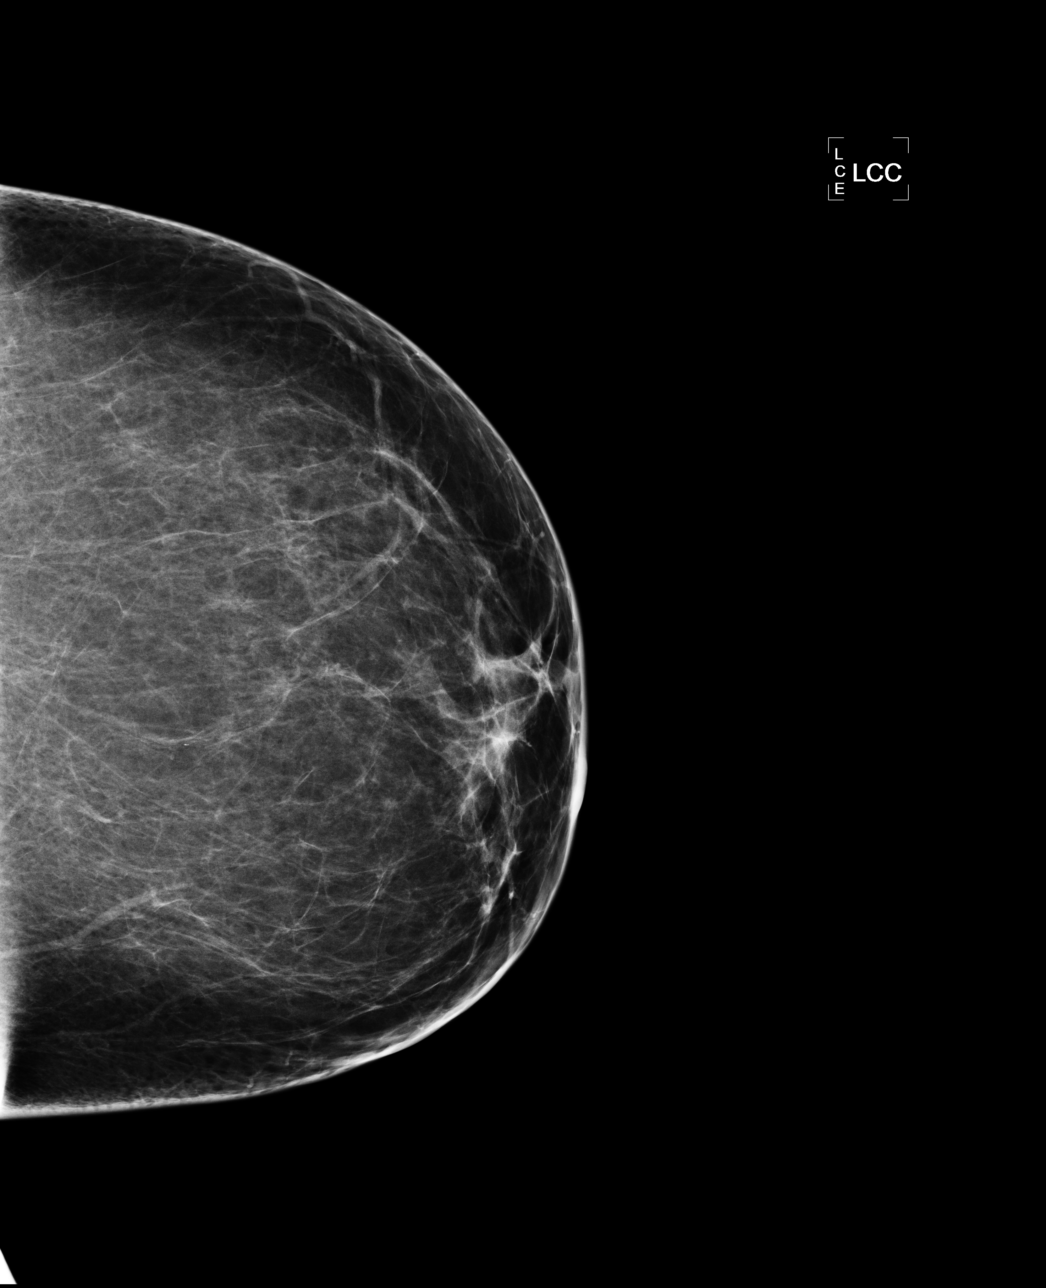

[L MLO]
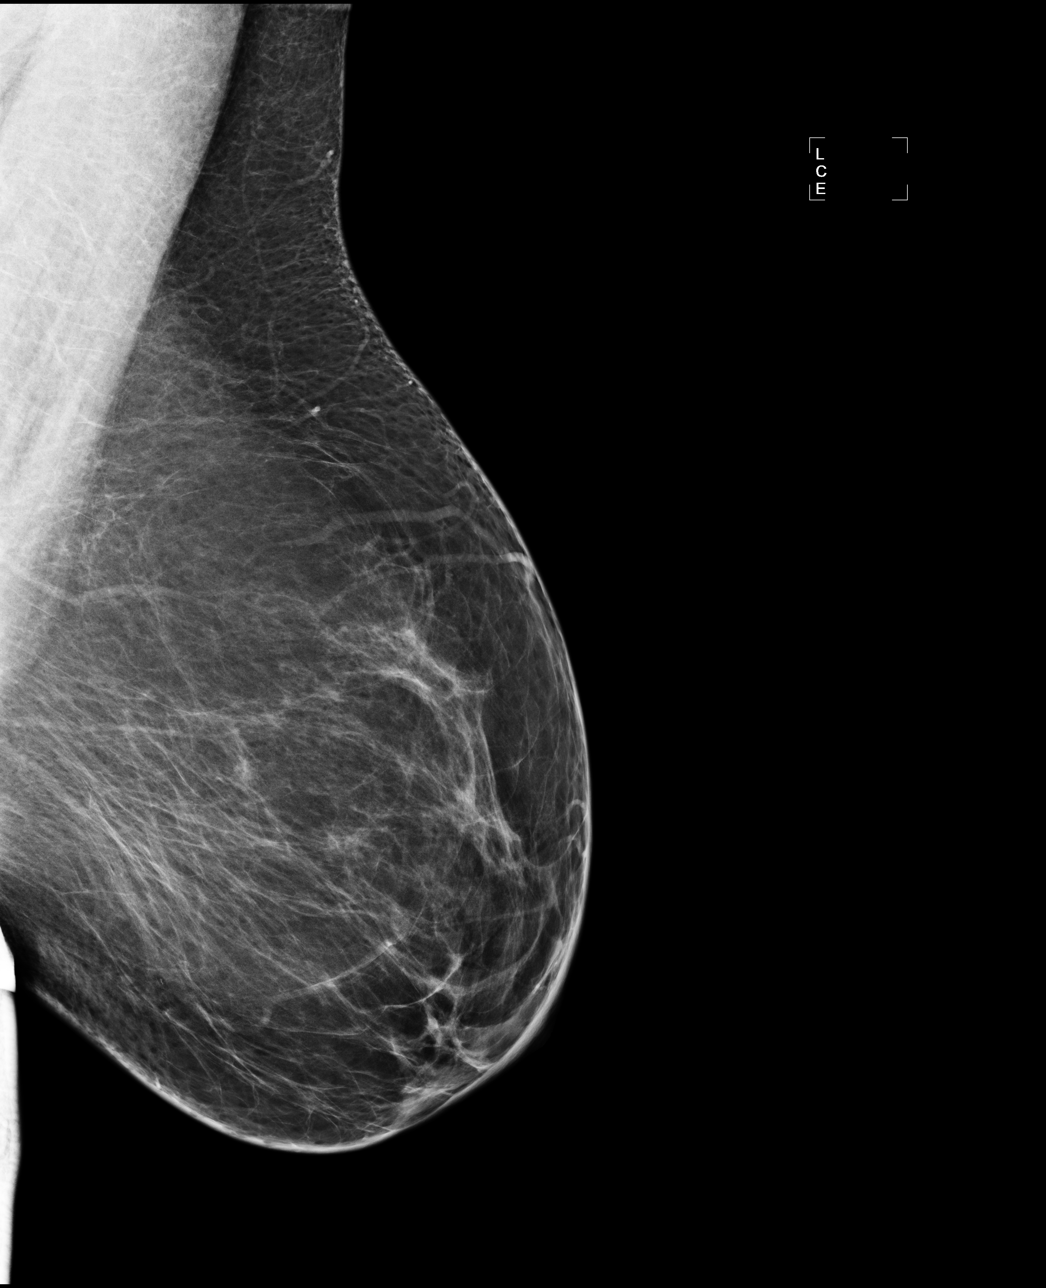

[R MLO]
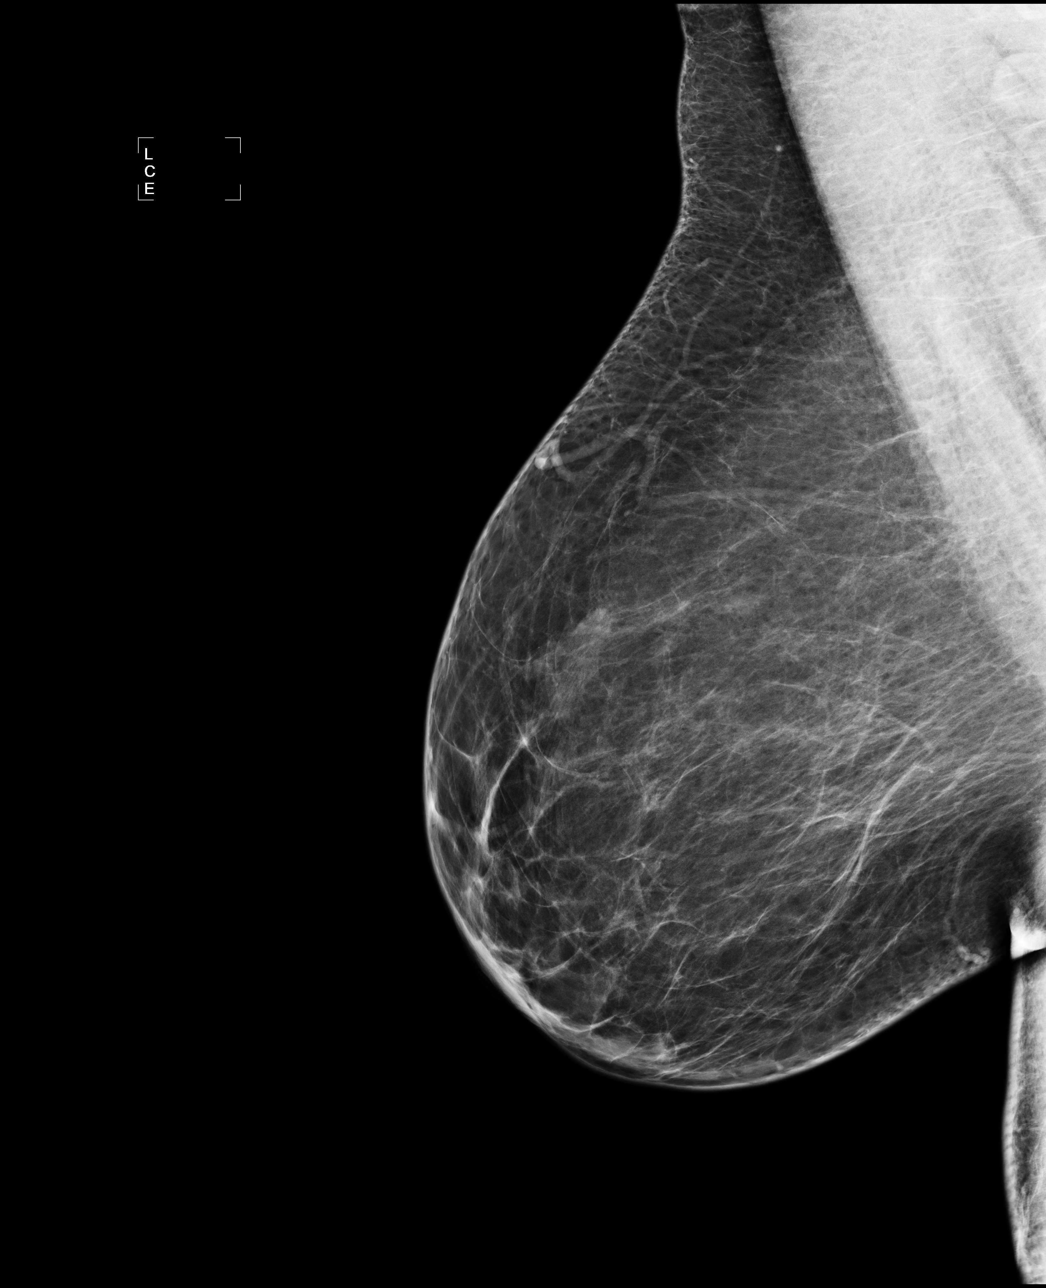

[4 of 4 positions shown; findings below may reference images not displayed]

FINDINGS: There are no findings suspicious for malignancy. Images were
processed with CAD.
IMPRESSION: No mammographic evidence of malignancy. A result letter of this
screening mammogram will be mailed directly to the patient.

RECOMMENDATION:
Screening mammogram in one year. (Code:[id number])

BI-RADS CATEGORY  1: Negative.

## 2015-12-06 ENCOUNTER — Other Ambulatory Visit: Payer: Self-pay | Admitting: *Deleted

## 2015-12-06 DIAGNOSIS — R232 Flushing: Secondary | ICD-10-CM

## 2015-12-06 MED ORDER — VENLAFAXINE HCL 75 MG PO TABS: 75.0000 mg | ORAL_TABLET | Freq: Two times a day (BID) | ORAL | 1 refills | Status: DC

## 2015-12-06 NOTE — Telephone Encounter (Signed)
Effexor refilled.  Archie Patten, MD [facility] Resident  12/06/2015, 5:24 PM

## 2015-12-10 ENCOUNTER — Telehealth: Payer: Self-pay | Admitting: Gastroenterology

## 2015-12-10 MED ORDER — POLYETHYLENE GLYCOL 3350 17 GM/SCOOP PO POWD: ORAL | 5 refills | Status: DC

## 2015-12-10 NOTE — Telephone Encounter (Signed)
Informed patient that I can send in a generic Miralax prescription to see if it's cheaper for her. Patient agreed and verbalized understanding. Prescription sent to the pharmacy.

## 2015-12-17 ENCOUNTER — Ambulatory Visit (INDEPENDENT_AMBULATORY_CARE_PROVIDER_SITE_OTHER): Payer: Medicaid Other | Admitting: Family Medicine

## 2015-12-17 ENCOUNTER — Encounter: Payer: Self-pay | Admitting: Family Medicine

## 2015-12-17 VITALS — BP 128/78 | HR 61 | Temp 98.5°F | Wt 190.0 lb

## 2015-12-17 DIAGNOSIS — J4520 Mild intermittent asthma, uncomplicated: Secondary | ICD-10-CM | POA: Diagnosis not present

## 2015-12-17 DIAGNOSIS — R7309 Other abnormal glucose: Secondary | ICD-10-CM

## 2015-12-17 DIAGNOSIS — M797 Fibromyalgia: Secondary | ICD-10-CM

## 2015-12-17 DIAGNOSIS — R7303 Prediabetes: Secondary | ICD-10-CM

## 2015-12-17 LAB — POCT GLYCOSYLATED HEMOGLOBIN (HGB A1C): HEMOGLOBIN A1C: 6

## 2015-12-17 MED ORDER — CYCLOBENZAPRINE HCL 10 MG PO TABS: ORAL_TABLET | ORAL | 2 refills | Status: DC

## 2015-12-17 MED ORDER — ALBUTEROL SULFATE HFA 108 (90 BASE) MCG/ACT IN AERS: 2.0000 | INHALATION_SPRAY | Freq: Four times a day (QID) | RESPIRATORY_TRACT | 2 refills | Status: DC | PRN

## 2015-12-17 NOTE — Patient Instructions (Signed)
It was nice to meet you. Your A1c, the average of your blood sugar over the last 3 months, is stable. Try to decrease the amount of fluid intake 1-2 hours prior to bedtime so that you not having to wake up to urinate more frequently. These follow-up with me in 6 months or sooner as needed If you change your mind other flu vaccine, you can make a nurse's appointment for this.

## 2015-12-17 NOTE — Assessment & Plan Note (Signed)
Refill on Effexor. Stress importance of exercise to limit symptoms of fibromyalgia.

## 2015-12-17 NOTE — Assessment & Plan Note (Signed)
Refill albuterol.

## 2015-12-17 NOTE — Progress Notes (Signed)
    Subjective: CC: f/u CBGs HPI: Patient is a 55 y.o. female with a past medical history of prediabetes, GERD, fibromyalgia, hyperlipidemia presenting to clinic today for a f/u on diabetes.  Prediabetes:  CBGs at home: doesn't check Taking medications: No ROS: denies fever, chills, dizziness, LOC, polyuria, polydipsia, numbness or tingling in extremities or chest pain. Last A1c: 5.8 The patient is worried as her weight has continued to decrease, despite the fact that she is not exercising as much. She knows that that can be a symptom of diabetes. She does note that she has been eating more healthy. She is decrease the amount of juice that she drinks, she now mixes water with Crystal light.  She has been increasing her water intake, therefore she has been urinating more presently. She notes nocturia every were. She typically goes to bed around 11:30, and her last drink of water is around 10:30.   Fibromyalgia: Doing well on Effexor.   Asthma: Doing well since the air has become cool. Not using albuterol frequently. Would like like refill  Social History: Never smoker  Health Maintenance: Declines flu vaccine today.  ROS: All other systems reviewed and are negative.  Past Medical History Patient Active Problem List   Diagnosis Date Noted  . Hyperpigmentation of skin 07/26/2015  . Episodic lightheadedness 07/26/2015  . Hyperlipidemia 07/26/2015  . Pre-diabetes 07/26/2015  . Obesity 07/26/2015  . Metatarsalgia of right foot 12/19/2014  . 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 GERD (gastroesophageal reflux disease) 11/29/2013  . Change in bowel habits 10/12/2013  . Rectal bleeding 10/03/2013  . Greater trochanteric bursitis of right hip 09/16/2013  . Asthma, mild intermittent 10/25/2012  . Posterior tibial tendinitis 07/30/2012  . Palpitations 12/26/2011  . Hot flashes 09/26/2011  . Fibromyalgia 07/18/2011  . Dysfunctional uterine bleeding     Medications-  reviewed and updated  Objective: Office vital signs reviewed. BP 128/78   Pulse 61   Temp 98.5 F (36.9 C) (Oral)   Wt 190 lb (86.2 kg)   SpO2 99%   BMI 31.62 kg/m    Physical Examination:  General: Awake, alert, well- nourished, NAD Cardio: RRR, no m/r/g noted.  Pulm: No increased WOB.  CTAB, without wheezes, rhonchi or crackles noted.  Extremities: No swelling,  +2 pulses bilaterally  A1c- 6.0  Assessment/Plan: Pre-diabetes A1c slightly up from 5.8 to 6.0 today. Despite her decrease in exercise, I suspect that her weight loss is secondary to improvement in her diet.  -Discussed at least 30 minutes of aerobic exercise 5 times per week -Discussed diet changes. -Patient will follow-up in 6 months or sooner as needed.    Asthma, mild intermittent Refill albuterol.  Fibromyalgia Refill on Effexor. Stress importance of exercise to limit symptoms of fibromyalgia.   Orders Placed This Encounter  Procedures  . HgB A1c    Meds ordered this encounter  Medications  . cyclobenzaprine (FLEXERIL) 10 MG tablet    Sig: TAKE 1/2 TO 1 TABLETS (5-10 MG TOTAL) BY MOUTH 3 (THREE) TIMES DAILY AS NEEDED.    Dispense:  30 tablet    Refill:  2  . albuterol (PROAIR HFA) 108 (90 Base) MCG/ACT inhaler    Sig: Inhale 2 puffs into the lungs every 6 (six) hours as needed for wheezing or shortness of breath.    Dispense:  8.5 Inhaler    Refill:  [facility] PGY-3, [place]

## 2015-12-17 NOTE — Assessment & Plan Note (Addendum)
A1c slightly up from 5.8 to 6.0 today. Despite her decrease in exercise, I suspect that her weight loss is secondary to improvement in her diet.  -Discussed at least 30 minutes of aerobic exercise 5 times per week -Discussed diet changes. -Patient will follow-up in 6 months or sooner as needed.

## 2016-01-28 ENCOUNTER — Other Ambulatory Visit: Payer: Self-pay | Admitting: *Deleted

## 2016-01-28 DIAGNOSIS — J4520 Mild intermittent asthma, uncomplicated: Secondary | ICD-10-CM

## 2016-01-28 MED ORDER — ALBUTEROL SULFATE HFA 108 (90 BASE) MCG/ACT IN AERS
2.0000 | INHALATION_SPRAY | Freq: Four times a day (QID) | RESPIRATORY_TRACT | 3 refills | Status: DC | PRN
Start: 2016-01-28 — End: 2016-07-29

## 2016-02-01 ENCOUNTER — Other Ambulatory Visit: Payer: Self-pay | Admitting: Family Medicine

## 2016-02-01 DIAGNOSIS — R232 Flushing: Secondary | ICD-10-CM

## 2016-03-29 ENCOUNTER — Other Ambulatory Visit: Payer: Self-pay | Admitting: Family Medicine

## 2016-03-29 DIAGNOSIS — R232 Flushing: Secondary | ICD-10-CM

## 2016-05-19 ENCOUNTER — Telehealth: Payer: Self-pay | Admitting: Gastroenterology

## 2016-05-19 NOTE — Telephone Encounter (Signed)
She is having nausea and bloating with generic Miralax.  She is asking if she needs to come back and see Dr. Fuller Plan or try Name brand that she did well with.  She will try switching back to the name brand Miralax and if her symptoms don't improve she can call back for an office visit

## 2016-06-02 ENCOUNTER — Other Ambulatory Visit: Payer: Self-pay | Admitting: Internal Medicine

## 2016-06-02 DIAGNOSIS — R232 Flushing: Secondary | ICD-10-CM

## 2016-07-29 ENCOUNTER — Other Ambulatory Visit: Payer: Self-pay | Admitting: Family Medicine

## 2016-07-29 DIAGNOSIS — J4520 Mild intermittent asthma, uncomplicated: Secondary | ICD-10-CM

## 2016-07-31 ENCOUNTER — Ambulatory Visit (HOSPITAL_COMMUNITY)
Admission: RE | Admit: 2016-07-31 | Discharge: 2016-07-31 | Disposition: A | Discharge status: Home / Self Care | Payer: Medicaid Other | Source: Ambulatory Visit | Attending: Family Medicine | Admitting: Family Medicine

## 2016-07-31 ENCOUNTER — Encounter: Payer: Self-pay | Admitting: Family Medicine

## 2016-07-31 ENCOUNTER — Ambulatory Visit (INDEPENDENT_AMBULATORY_CARE_PROVIDER_SITE_OTHER): Payer: Medicaid Other | Admitting: Family Medicine

## 2016-07-31 ENCOUNTER — Ambulatory Visit
Admission: RE | Admit: 2016-07-31 | Discharge: 2016-07-31 | Disposition: A | Discharge status: Home / Self Care | Payer: Medicaid Other | Source: Ambulatory Visit | Attending: Family Medicine | Admitting: Family Medicine

## 2016-07-31 VITALS — BP 112/78 | HR 61 | Temp 98.5°F | Ht 65.0 in | Wt 192.0 lb

## 2016-07-31 DIAGNOSIS — G629 Polyneuropathy, unspecified: Secondary | ICD-10-CM | POA: Diagnosis not present

## 2016-07-31 DIAGNOSIS — R079 Chest pain, unspecified: Secondary | ICD-10-CM | POA: Insufficient documentation

## 2016-07-31 DIAGNOSIS — R7303 Prediabetes: Secondary | ICD-10-CM

## 2016-07-31 DIAGNOSIS — M25511 Pain in right shoulder: Secondary | ICD-10-CM

## 2016-07-31 DIAGNOSIS — R001 Bradycardia, unspecified: Secondary | ICD-10-CM | POA: Insufficient documentation

## 2016-07-31 DIAGNOSIS — M898X1 Other specified disorders of bone, shoulder: Secondary | ICD-10-CM

## 2016-07-31 LAB — POCT GLYCOSYLATED HEMOGLOBIN (HGB A1C): Hemoglobin A1C: 5.8

## 2016-07-31 MED ORDER — PREGABALIN 50 MG PO CAPS: 50.0000 mg | ORAL_CAPSULE | Freq: Three times a day (TID) | ORAL | 0 refills | Status: DC

## 2016-07-31 NOTE — Assessment & Plan Note (Addendum)
A1c 5.8. Continue lifestyle modifications.

## 2016-07-31 NOTE — Progress Notes (Signed)
Subjective: CC: f/u CBGs HPI: Patient is a 56 y.o. female with a past medical history of prediabetes, GERD, fibromyalgia, hyperlipidemia presenting to clinic today for a f/u on prediabetes.  Prediabetes:  Taking medications: No, lifestyle controlled ROS: denies fever, chills, dizziness, LOC, polyuria, polydipsia, numbness or tingling in extremities or chest pain. Last A1c: 6.0 on 12/17/15  Fibromyalgia: Doing well on Effexor.   Asthma: Doing well since the air has become cool. Not using albuterol frequently. Would like like refill  Shoulder blade pain:  Right hand intermittently getting numb. This started 4 weeks ago Only gets numb when holding her arm up. R shoulder pain x 3 weeks. Can't sleep on it. Can't describe pain, doesn't feel like her fibromyalgia. Denies it is sharp, throbbing, burning, aching. Doesn't radiate down into her arm however still intermittently has numbness of the hands. Movement makes it worse as well.  No swelling. No h/o injury. No rash.  Unsure if she felt a pop on its onset. Biofreeze doesn't help. Salonpas didn't help. Muscle relaxer didn't help (just caused drowsiness)  She has also noted chest pain intermittently x 3 day .  Was at rest at [place] study and noted right sided chest pain that was severe and didn't radiate. States it was sharp. Unsure if it was due to the terrible shoulder blade pain she was having. Took a warm shower and felt better. Denies SOB, diaphoresis with this. No chest pain currently    Social History: Never smoker  Health Maintenance: Declines flu vaccine today.  ROS: All other systems reviewed and are negative.  Past Medical History Patient Active Problem List   Diagnosis Date Noted  . Shoulder blade pain 07/31/2016  . Hyperpigmentation of skin 07/26/2015  . Episodic lightheadedness 07/26/2015  . Hyperlipidemia 07/26/2015  . Pre-diabetes 07/26/2015  . Obesity 07/26/2015  . Metatarsalgia of right foot 12/19/2014  .  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 GERD (gastroesophageal reflux disease) 11/29/2013  . Change in bowel habits 10/12/2013  . Rectal bleeding 10/03/2013  . Greater trochanteric bursitis of right hip 09/16/2013  . Asthma, mild intermittent 10/25/2012  . Posterior tibial tendinitis 07/30/2012  . Palpitations 12/26/2011  . Hot flashes 09/26/2011  . Fibromyalgia 07/18/2011  . Dysfunctional uterine bleeding     Medications- reviewed and updated  Objective: Office vital signs reviewed. BP 112/78   Pulse 61   Temp 98.5 F (36.9 C) (Oral)   Ht 5' 5"  (1.651 m)   Wt 192 lb (87.1 kg)   SpO2 99%   BMI 31.95 kg/m    Physical Examination:  General: Awake, alert, well- nourished, NAD Cardio: RRR, no m/r/g noted. pain is not reproducible on palpation Pulm: No increased WOB.  CTAB, without wheezes, rhonchi or crackles noted.  Extremities: No swelling,  +2 pulses bilaterally Back: Normal to inspection, tenderness to palpation over the right shoulder blade over the trapezius. No knots or spasticity noted.   R shoulder: no tenderness to palpation. Decreased flexion, abduction, and external rotation on the right due to reported pain in the shoulder blade region. 5 out of 5 strength in all movements at waist level. Normal biceps reflexes. Sensation intact in the upper extremities and hands bilaterally currently. Patient endorses pain with Early Osmond, speed's, Hawkins, and empty can test in the right shoulder blade region, but none in the shoulder.   Neck: Normal to inspection. Full range of motion. No tenderness to palpation. Negative Spurling's.  A1c- 5.8  EKG: Normal sinus rhythm  with a heart rate of 59. No ST elevation or depression. T-wave inversion in aVR and V1 which is present on an EKG from July 2016. QTC 447.  Assessment/Plan: Pre-diabetes A1c 5.8. Continue lifestyle modifications.  Shoulder blade pain Patient presenting with what seems to be right shoulder pain  in addition to intermittent right hand numbness. Additionally, she is now had some right anterior chest wall pain, but denies any currently. Unsure if this is all secondary to worsening fibromyalgia, however patient seems to know her fiber myalgia symptoms well. It is interesting that she does not have any tenderness in the shoulder joint or neck. Her symptoms, however do seem consistent with a possible nerve impingement given the numbness. No rash over the shoulder blade concerning for something like shingles.  Additionally, I cannot place the right-sided chest pain that occurs intermittently. The patient seems to think this is secondary to her shoulder blade pain as well. EKG is unchanged from previous. Her chest pain is not typical and she's had a normal stress test in 2016.  Fortunately, no red flags on exam. I discussed the case with attending, Dr.Eniola.  -We'll get an x-ray of both the right shoulder and cervical spine. -Patient reports significant symptoms of mood changes with gabapentin, will prescribe Lyrica 50 mg 3 times a day -If no improvement in symptoms and that unable to find any abnormalities on x-ray, could consider referral to neurology for nerve studies.   Orders Placed This Encounter  Procedures  . DG Shoulder Right    Standing Status:   Future    Number of Occurrences:   1    Standing Expiration Date:   09/30/2017    Order Specific Question:   Reason for Exam (SYMPTOM  OR DIAGNOSIS REQUIRED)    Answer:   right shoulder blade pain, decreased ROM in shoulder, numbness in R hand    Order Specific Question:   Is patient pregnant?    Answer:   No    Order Specific Question:   Preferred imaging location?    Answer:   [facility]    Order Specific Question:   Radiology Contrast Protocol - do NOT remove file path    Answer:   \\[email or web address]  . DG Cervical Spine Complete    Standing Status:   Future    Number of Occurrences:    1    Standing Expiration Date:   09/30/2017    Order Specific Question:   Reason for Exam (SYMPTOM  OR DIAGNOSIS REQUIRED)    Answer:   right shoulder blade pain, decreased ROM in shoulder, numbness in R hand    Order Specific Question:   Is patient pregnant?    Answer:   No    Order Specific Question:   Preferred imaging location?    Answer:   [facility]    Order Specific Question:   Radiology Contrast Protocol - do NOT remove file path    Answer:   \\[email or web address].pdf  . POCT HgB A1C (CPT 83036)  . EKG 12-Lead    Meds ordered this encounter  Medications  . pregabalin (LYRICA) 50 MG capsule    Sig: Take 1 capsule (50 mg total) by mouth 3 (three) times daily.    Dispense:  90 capsule    Refill:  [place] PGY-3, [place]

## 2016-07-31 NOTE — Patient Instructions (Signed)
Start taking Lyrica 54m three times a day- take it at night initially as it can make some people sleepy. Once you get the imaging, I will call you with the results. Sometimes it takes the radiologist some time to read the films so it may take up to 24 hours.  Your blood sugar looks good.   Follow up with me in 2 weeks or sooner as needed.

## 2016-07-31 NOTE — Assessment & Plan Note (Addendum)
Patient presenting with what seems to be right shoulder pain in addition to intermittent right hand numbness. Additionally, she is now had some right anterior chest wall pain, but denies any currently. Unsure if this is all secondary to worsening fibromyalgia, however patient seems to know her fiber myalgia symptoms well. It is interesting that she does not have any tenderness in the shoulder joint or neck. Her symptoms, however do seem consistent with a possible nerve impingement given the numbness. No rash over the shoulder blade concerning for something like shingles.  Additionally, I cannot place the right-sided chest pain that occurs intermittently. The patient seems to think this is secondary to her shoulder blade pain as well. EKG is unchanged from previous. Her chest pain is not typical and she's had a normal stress test in 2016.  Fortunately, no red flags on exam. I discussed the case with attending, Dr.Eniola.  -We'll get an x-ray of both the right shoulder and cervical spine. -Patient reports significant symptoms of mood changes with gabapentin, will prescribe Lyrica 50 mg 3 times a day -If no improvement in symptoms and that unable to find any abnormalities on x-ray, could consider referral to neurology for nerve studies.

## 2016-08-01 ENCOUNTER — Telehealth: Payer: Self-pay | Admitting: Family Medicine

## 2016-08-01 ENCOUNTER — Telehealth: Payer: Self-pay

## 2016-08-01 NOTE — Telephone Encounter (Signed)
Pt went to pharmacy, was told needs pre authorization for Lyrica.  Should she be given something else?  Please let pt know what she needs to do on her end. Ottis Stain, CMA

## 2016-08-01 NOTE — Telephone Encounter (Signed)
Prior Authorization received from [facility] pharmacy for Lyrica 50 mg. PA was approved via [place] until 08/01/17. Approval number: [id number] Derl Barrow, RN

## 2016-08-01 NOTE — Telephone Encounter (Signed)
Called patient to discuss normal imaging of the c-spine and shoulder.   She has not yet picked up the Lyrica, it needed PA (has already been done). She'll touch base with the pharmacy.  If no improvement in her symptoms, could consider referral to neurology.   Archie Patten, MD [facility] Resident  08/01/2016, 11:52 AM

## 2016-08-02 ENCOUNTER — Other Ambulatory Visit: Payer: Self-pay | Admitting: Family Medicine

## 2016-08-02 DIAGNOSIS — R232 Flushing: Secondary | ICD-10-CM

## 2016-10-06 ENCOUNTER — Other Ambulatory Visit: Payer: Self-pay | Admitting: *Deleted

## 2016-10-06 DIAGNOSIS — R232 Flushing: Secondary | ICD-10-CM

## 2016-10-06 MED ORDER — VENLAFAXINE HCL 75 MG PO TABS: 75.0000 mg | ORAL_TABLET | Freq: Two times a day (BID) | ORAL | 1 refills | Status: DC

## 2016-10-06 MED ORDER — TRAZODONE HCL 50 MG PO TABS: 25.0000 mg | ORAL_TABLET | Freq: Every evening | ORAL | 1 refills | Status: DC | PRN

## 2016-11-07 ENCOUNTER — Other Ambulatory Visit: Payer: Self-pay | Admitting: Family Medicine

## 2016-11-07 DIAGNOSIS — J4520 Mild intermittent asthma, uncomplicated: Secondary | ICD-10-CM

## 2016-11-07 MED ORDER — ALBUTEROL SULFATE HFA 108 (90 BASE) MCG/ACT IN AERS: INHALATION_SPRAY | RESPIRATORY_TRACT | 3 refills | Status: DC

## 2016-11-10 ENCOUNTER — Other Ambulatory Visit: Payer: Self-pay | Admitting: *Deleted

## 2016-11-10 DIAGNOSIS — K219 Gastro-esophageal reflux disease without esophagitis: Secondary | ICD-10-CM

## 2016-11-11 MED ORDER — OMEPRAZOLE 20 MG PO CPDR: 20.0000 mg | DELAYED_RELEASE_CAPSULE | Freq: Every day | ORAL | 2 refills | Status: DC

## 2016-12-02 ENCOUNTER — Other Ambulatory Visit: Payer: Self-pay | Admitting: Family Medicine

## 2016-12-02 DIAGNOSIS — R232 Flushing: Secondary | ICD-10-CM

## 2017-01-23 DIAGNOSIS — L089 Local infection of the skin and subcutaneous tissue, unspecified: Secondary | ICD-10-CM | POA: Diagnosis not present

## 2017-02-05 ENCOUNTER — Other Ambulatory Visit: Payer: Self-pay | Admitting: Family Medicine

## 2017-02-05 DIAGNOSIS — R232 Flushing: Secondary | ICD-10-CM

## 2017-04-03 ENCOUNTER — Telehealth: Payer: Self-pay | Admitting: Gastroenterology

## 2017-04-03 NOTE — Telephone Encounter (Signed)
Patient advised ok to try mag citrate for occasional constipation.

## 2017-04-07 ENCOUNTER — Other Ambulatory Visit: Payer: Self-pay | Admitting: Family Medicine

## 2017-04-07 DIAGNOSIS — R232 Flushing: Secondary | ICD-10-CM

## 2017-04-10 ENCOUNTER — Other Ambulatory Visit: Payer: Self-pay

## 2017-04-10 MED ORDER — CYCLOBENZAPRINE HCL 10 MG PO TABS: ORAL_TABLET | ORAL | 2 refills | Status: DC

## 2017-05-20 ENCOUNTER — Ambulatory Visit: Payer: Medicaid Other | Admitting: Internal Medicine

## 2017-05-20 ENCOUNTER — Encounter: Payer: Self-pay | Admitting: Internal Medicine

## 2017-05-20 ENCOUNTER — Other Ambulatory Visit: Payer: Self-pay

## 2017-05-20 VITALS — BP 118/70 | HR 65 | Temp 98.0°F | Ht 65.0 in | Wt 201.0 lb

## 2017-05-20 DIAGNOSIS — R5383 Other fatigue: Secondary | ICD-10-CM | POA: Diagnosis not present

## 2017-05-20 DIAGNOSIS — Z1239 Encounter for other screening for malignant neoplasm of breast: Secondary | ICD-10-CM

## 2017-05-20 DIAGNOSIS — Z87898 Personal history of other specified conditions: Secondary | ICD-10-CM

## 2017-05-20 DIAGNOSIS — H8110 Benign paroxysmal vertigo, unspecified ear: Secondary | ICD-10-CM | POA: Diagnosis not present

## 2017-05-20 DIAGNOSIS — R42 Dizziness and giddiness: Secondary | ICD-10-CM | POA: Diagnosis not present

## 2017-05-20 LAB — POCT GLYCOSYLATED HEMOGLOBIN (HGB A1C): HEMOGLOBIN A1C: 6.3

## 2017-05-20 MED ORDER — MECLIZINE HCL 25 MG PO TABS: 25.0000 mg | ORAL_TABLET | Freq: Three times a day (TID) | ORAL | 1 refills | Status: DC | PRN

## 2017-05-20 NOTE — Progress Notes (Signed)
   Subjective:   Patient: Amanda Dickerson       Birthdate: Apr 10, 1960       MRN: [id number]      HPI  Amanda Dickerson is a 57 y.o. female presenting for dizziness and fatigue.   Dizziness and fatigue Ongoing for past month or so. Has also generally felt unwell. Has history of dizziness for which she has been prescribed meclizine in the past which she says did help. Does not have any currently so has not taken any during recent dizzy spells. Describes dizziness as feeling as if the room is spinning. Episodes occur randomly, but she has noticed that if she stays in a certain position (eg lying flat on her bed in back rather than on her side) the symptoms subside. Episodes have been lasting for longer periods of time. Five days ago, she woke up feeling dizzy, and symptoms persisted until 7PM. Endorses nausea but no vomiting during episodes. Has not fallen. Also feels very fatigued. Has history of migraines and says these have been worse recently as well, though does not seem to be related to dizziness as far as she can tell. Denies changes in hearing, including ringing in ears. No recent illnesses or cold symptoms.  Smoking status reviewed. Patient is never smoker.   Review of Systems See HPI.     Objective:  Physical Exam  Constitutional: She is oriented to person, place, and time and well-developed, well-nourished, and in no distress.  HENT:  Head: Normocephalic and atraumatic.  Eyes: Conjunctivae and EOM are normal. Pupils are equal, round, and reactive to light. Right eye exhibits no discharge. Left eye exhibits no discharge.  No nystagmus  Pulmonary/Chest: Effort normal. No respiratory distress.  Neurological: She is alert and oriented to person, place, and time. Gait normal.  CN II-XII intact. 5/5 strength upper and lower extremities bilaterally.   Skin: Skin is warm and dry.  Psychiatric: Affect and judgment normal.      Assessment & Plan:  Episodic lightheadedness Chronic issue.  Followed by ENT already and has been referred to neurology in the past. Possibly BPPV as symptoms worsen with certain position changes. Association with migraines also possibility, as has been postulated in the past. Vestibular neuritis consideration as well, however no known recent illness. Larynthitis/Meniere's less likely as no hearing changes. Will treat conservatively with meclizine for now. Given accompanying fatigue, will also get CBC, BMP, TSH to rule out other possible etiologies. Will call patient with results when available.    Adin Hector, MD, MPH PGY-3 [facility] Pager [phone]

## 2017-05-20 NOTE — Assessment & Plan Note (Signed)
Chronic issue. Followed by ENT already and has been referred to neurology in the past. Possibly BPPV as symptoms worsen with certain position changes. Association with migraines also possibility, as has been postulated in the past. Vestibular neuritis consideration as well, however no known recent illness. Larynthitis/Meniere's less likely as no hearing changes. Will treat conservatively with meclizine for now. Given accompanying fatigue, will also get CBC, BMP, TSH to rule out other possible etiologies. Will call patient with results when available.

## 2017-05-20 NOTE — Patient Instructions (Signed)
It was nice meeting you today Ms. Amanda Dickerson!  I will call you when the results of your testing are back, likely tomorrow or Friday. In the meantime, you can take meclizine (Antivert) as needed for dizziness. If your symptoms get worse, or if you fall and hit your head, please call our office right away or go to the emergencyr oom.   If you have any questions or concerns, please feel free to call the clinic.   Be well,  Dr. Avon Gully

## 2017-05-21 ENCOUNTER — Telehealth: Payer: Self-pay | Admitting: Internal Medicine

## 2017-05-21 LAB — BASIC METABOLIC PANEL
BUN / CREAT RATIO: 10 (ref 9–23)
BUN: 8 mg/dL (ref 6–24)
CALCIUM: 9.2 mg/dL (ref 8.7–10.2)
CHLORIDE: 104 mmol/L (ref 96–106)
CO2: 24 mmol/L (ref 20–29)
Creatinine, Ser: 0.8 mg/dL (ref 0.57–1.00)
GFR calc Af Amer: 96 mL/min/{1.73_m2} (ref >59)
GFR calc non Af Amer: 83 mL/min/{1.73_m2} (ref >59)
GLUCOSE: 111 mg/dL — AB (ref 65–99)
Potassium: 4.3 mmol/L (ref 3.5–5.2)
Sodium: 142 mmol/L (ref 134–144)

## 2017-05-21 LAB — CBC
HEMATOCRIT: 40.6 % (ref 34.0–46.6)
Hemoglobin: 13.8 g/dL (ref 11.1–15.9)
MCH: 28.1 pg (ref 26.6–33.0)
MCHC: 34 g/dL (ref 31.5–35.7)
MCV: 83 fL (ref 79–97)
Platelets: 279 10*3/uL (ref 150–379)
RBC: 4.91 x10E6/uL (ref 3.77–5.28)
RDW: 13.9 % (ref 12.3–15.4)
WBC: 4 10*3/uL (ref 3.4–10.8)

## 2017-05-21 LAB — TSH: TSH: 1.84 u[IU]/mL (ref 0.450–4.500)

## 2017-05-21 NOTE — Telephone Encounter (Signed)
Called patient regarding lab results. Discussed that they were normal. Given normal labs and normal physical exam findings yesterday with no neuro deficits, feel that continuing conservative management with meclizine PRN dizziness right now is appropriate, especially as patient has had similar episodes in the past that have resolved spontaneously. Patient has appt with PCP Dr. Yisroel Ramming on 04/09. If symptoms still present then, encouraged her to discuss these with him. Also encouraged her to call or schedule a sooner appt if symptoms changes or worsen in the meantime. Patient voiced understanding and appreciation.   Adin Hector, MD, MPH PGY-3 [facility] Pager [phone]

## 2017-06-06 ENCOUNTER — Other Ambulatory Visit: Payer: Self-pay | Admitting: Family Medicine

## 2017-06-06 DIAGNOSIS — R232 Flushing: Secondary | ICD-10-CM

## 2017-06-08 ENCOUNTER — Encounter: Payer: Self-pay | Admitting: Family Medicine

## 2017-06-08 NOTE — Progress Notes (Signed)
Subjective   Patient ID: Amanda Dickerson    DOB: 24-May-1960, 57 y.o. female   MRN: [id number]  CC: "Not feeling well"  HPI: Amanda Dickerson is a 57 y.o. female who presents to clinic today for the following:  Episode of vertigo: Chronic for several years with intermittent episodes with sensations of moving when patient is not.  Has been evaluated by ENT with unremarkable workup.  Was recommended to follow-up with neurology.  Recently seen in clinic 3/20 and given meclizine for suspected BPPV.  Patient denies, sensory loss, dysarthria, change in vision, or hearing loss.  She does have a history of migraines and thinks her migraines may have become more frequent over the last year.  She is hopeful to see the neurologist.  Asthma: History of mild intermittent with use of albuterol rescue inhaler only.  Patient is a non-smoker.  She denies any history of shortness of breath, chest pain, wheeze.  She needs a refill on her albuterol inhaler today.  Cervical cancer screening: Patient received Pap smear back in 2015 with negative results.  She would like a co-testing today.  She is not sexually active and denies any vaginal discharge or bleeding.  ROS: see HPI for pertinent.  [place]: Pre-DM, obesity, asthma, GERD, fibromyalgia, genital herpes. Surgical history c-sec, tubal, endometrial ablation, tooth extractions. Family history HTN, heart disease, DM, prostate CA (father). Smoking status reviewed. Medications reviewed.  Objective   BP 120/70 (BP Location: Right Arm, Patient Position: Sitting, Cuff Size: Normal)   Pulse 69   Temp 98.6 F (37 C) (Oral)  Vitals and nursing note reviewed.  General: well nourished, well developed, NAD with non-toxic appearance HEENT: normocephalic, atraumatic, moist mucous membranes Cardiovascular: regular rate and rhythm without murmurs, rubs, or gallops Lungs: clear to auscultation bilaterally with normal work of breathing GU: accompanied by chaperone, no external  lesions or rash, normal vaginal rugae, no foul odor, no vaginal discharge or bleeding, cervical loss non friable and nontender, no adnexal tenderness, no masses on bimanual exam Skin: warm, dry, no rashes or lesions, cap refill < 2 seconds Extremities: warm and well perfused, normal tone, no edema  Assessment & Plan   Episodic recurrent vertigo Chronic.  Intermittent.  Somewhat improved with meclizine per patient report.  Dizziness does seem to be more persistent with vertigo.  Does not seem to be unilateral.  Suspect this may be peripheral based on history.  Suspect BPPV.  Vestibular neuritis is possible but less likely given chronicity.  Patient without hearing loss making labyrinthitis/Mnire's less likely.  Patient does have history of migraines and endorses some increase in frequency.  Has been evaluated by ENT which did not reveal source.  Was recommended to follow-up with a neurologist.  Patient is adamant about seeing a neurologist.  Given the long history and frequency of episodes, will proceed with referral. - Ambulatory referral to neurology - Continue meclizine 25 mg 3 times daily as needed - Reviewed return precautions - RTC 3 months  Asthma, mild intermittent Chronic.  Well-controlled.  Patient without exacerbations over the past few years.  Only using rescue inhaler.  Remains asymptomatic without wheeze on exam. - Refill albuterol inhaler  Encounter for screening for cervical cancer  Negative co-testing 08/2013.  Patient here today for Pap smear.  No risky sexual behavior.  Patient without vaginal discharge or bleeding. - Pap cytology with HPV  Orders Placed This Encounter  Procedures  . Ambulatory referral to Neurology    Referral Priority:  Routine    Referral Type:   Consultation    Referral Reason:   Specialty Services Required    Requested Specialty:   Neurology    Number of Visits Requested:   1   Meds ordered this encounter  Medications  . albuterol (PROAIR HFA)  108 (90 Base) MCG/ACT inhaler    Sig: Inhale 2 puffs every 6 hours as needed for wheeze/shortness of breath    Dispense:  2 Inhaler    Refill:  [place], [place], PGY-2 06/09/2017, 11:31 AM

## 2017-06-09 ENCOUNTER — Ambulatory Visit: Payer: Medicaid Other | Admitting: Family Medicine

## 2017-06-09 ENCOUNTER — Other Ambulatory Visit (HOSPITAL_COMMUNITY)
Admission: RE | Admit: 2017-06-09 | Discharge: 2017-06-09 | Disposition: A | Discharge status: Home / Self Care | Payer: Medicaid Other | Source: Ambulatory Visit | Attending: Family Medicine | Admitting: Family Medicine

## 2017-06-09 ENCOUNTER — Telehealth: Payer: Self-pay

## 2017-06-09 ENCOUNTER — Encounter: Payer: Self-pay | Admitting: Family Medicine

## 2017-06-09 VITALS — BP 120/70 | HR 69 | Temp 98.6°F

## 2017-06-09 DIAGNOSIS — H8190 Unspecified disorder of vestibular function, unspecified ear: Secondary | ICD-10-CM

## 2017-06-09 DIAGNOSIS — Z124 Encounter for screening for malignant neoplasm of cervix: Secondary | ICD-10-CM | POA: Insufficient documentation

## 2017-06-09 DIAGNOSIS — J4520 Mild intermittent asthma, uncomplicated: Secondary | ICD-10-CM | POA: Diagnosis not present

## 2017-06-09 DIAGNOSIS — R42 Dizziness and giddiness: Secondary | ICD-10-CM

## 2017-06-09 MED ORDER — ALBUTEROL SULFATE HFA 108 (90 BASE) MCG/ACT IN AERS: INHALATION_SPRAY | RESPIRATORY_TRACT | 3 refills | Status: DC

## 2017-06-09 NOTE — Assessment & Plan Note (Addendum)
Chronic.  Intermittent.  Somewhat improved with meclizine per patient report.  Dizziness does seem to be more persistent with vertigo.  Does not seem to be unilateral.  Suspect this may be peripheral based on history.  Suspect BPPV.  Vestibular neuritis is possible but less likely given chronicity.  Patient without hearing loss making labyrinthitis/Mnire's less likely.  Patient does have history of migraines and endorses some increase in frequency.  Has been evaluated by ENT which did not reveal source.  Was recommended to follow-up with a neurologist.  Patient is adamant about seeing a neurologist.  Given the long history and frequency of episodes, will proceed with referral. - Ambulatory referral to neurology - Continue meclizine 25 mg 3 times daily as needed - Reviewed return precautions - RTC 3 months

## 2017-06-09 NOTE — Patient Instructions (Signed)
Thank you for coming in to see Amanda Dickerson today. Please see below to review our plan for today's visit.  1.  Continue the meclizine on an as-needed basis.  I have placed a referral for you to be evaluated by a neurologist.  Contact our clinic when you have the name of the neurologist you would prefer.  If your symptoms significantly worsen, you have motor weakness, loss of feeling, or change in speech or vision, call 911 immediately. 2.  I sent in a prescription refill for your albuterol inhaler. 3.  Expect to receive a call if your Pap smear results are abnormal, otherwise you will receive them in the mail.  Please call the clinic at [phone] if your symptoms worsen or you have any concerns. It was our pleasure to serve you.  Harriet Butte, [place], PGY-2

## 2017-06-09 NOTE — Telephone Encounter (Signed)
Pt calling to inform Dr. Yisroel Ramming the name of her neurologist is Dr. Orson Aloe.  Her call back [phone] Wallace Cullens, RN

## 2017-06-09 NOTE — Assessment & Plan Note (Addendum)
Negative co-testing 08/2013.  Patient here today for Pap smear.  No risky sexual behavior.  Patient without vaginal discharge or bleeding. - Pap cytology with HPV

## 2017-06-09 NOTE — Assessment & Plan Note (Addendum)
Chronic.  Well-controlled.  Patient without exacerbations over the past few years.  Only using rescue inhaler.  Remains asymptomatic without wheeze on exam. - Refill albuterol inhaler

## 2017-06-10 NOTE — Addendum Note (Signed)
Addended byYisroel Ramming, DAVID J on: 06/10/2017 09:29 PM   Modules accepted: Orders

## 2017-06-11 ENCOUNTER — Encounter: Payer: Self-pay | Admitting: Family Medicine

## 2017-06-11 LAB — CYTOLOGY - PAP
Diagnosis: NEGATIVE
HPV: NOT DETECTED

## 2017-06-18 ENCOUNTER — Encounter: Payer: Self-pay | Admitting: Gastroenterology

## 2017-06-18 ENCOUNTER — Ambulatory Visit: Payer: Medicaid Other | Admitting: Gastroenterology

## 2017-06-18 ENCOUNTER — Telehealth: Payer: Self-pay | Admitting: *Deleted

## 2017-06-18 VITALS — BP 110/72 | HR 66 | Ht 65.0 in | Wt 202.0 lb

## 2017-06-18 DIAGNOSIS — K648 Other hemorrhoids: Secondary | ICD-10-CM

## 2017-06-18 DIAGNOSIS — K5904 Chronic idiopathic constipation: Secondary | ICD-10-CM | POA: Diagnosis not present

## 2017-06-18 NOTE — Progress Notes (Signed)
    History of Present Illness: This is a 57 year old female with a return in constipation and self limited rectal bleeding.    She states she was taking a high-fiber diet, probiotics and MiraLAX twice daily to control constipation for a couple years with good success.  She found she no longer needed MiraLAX and her constipation remained under good control.  One month ago her constipation returned and she had hard stools with straining for 2-3 days.  She noted small amounts of bright red blood on the tissue paper at that time.  No bleeding since that time. Since resuming MiraLAX her constipation has been under very good control.  She has no gastrointestinal complaints at this time.  Colonoscopy 10/2013 showed abnormal mucosa in the sigmoid colon and rectum and internal hemorrhoids.   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 - THERE IS NO EVIDENCE OF DYSPLASIA OR MALIGNANCY. - SEE COMMENT. Microscopic Comment The presence of scattered granulomatous-like inflammation suggests Crohn's disease. However, clinical and endoscopic correlation is necessary.  Current Medications, Allergies, Past Medical History, Past Surgical History, Family History and Social History were reviewed in [employer] record.  Physical Exam: General: Well developed, well nourished, no acute distress Head: Normocephalic and atraumatic Eyes:  sclerae anicteric, EOMI Ears: Normal auditory acuity Mouth: No deformity or lesions Lungs: Clear throughout to auscultation Heart: Regular rate and rhythm; no murmurs, rubs or bruits Abdomen: Soft, non tender and non distended. No masses, hepatosplenomegaly or hernias noted. Normal Bowel sounds Musculoskeletal: Symmetrical with no gross deformities  Pulses:  Normal pulses noted Extremities: No clubbing, cyanosis, edema or deformities noted Neurological: Alert oriented x 4, grossly  nonfocal Psychological:  Alert and cooperative. Normal mood and affect  Assessment and Recommendations:  1.  Chronic idiopathic constipation and internal hemorrhoids with self-limited bleeding.  Since her bleeding has resolved with resolution of her constipation no further evaluation at this time.  If rectal bleeding recurs or her bowel pattern she is advised to call to schedule  colonoscopy to further evaluate.  2.  Mildly abnormal rectosigmoid mucosa on prior colonoscopy with findings of colitis with scattered granulomatous inflammation.  Possible mild Crohn's colitis although she does not have symptoms typical for active Crohn's colitis.  Advised if rectal bleeding persists, other changes in bowel habits, abdominal pain or weight loss occur we will proceed with colonoscopy to further evaluate.

## 2017-06-18 NOTE — Telephone Encounter (Signed)
-----   Message from Sandy Level Bing, DO sent at 06/17/2017  4:35 PM EDT ----- Please call patient and notify her that the disability form is available for pickup. Thanks.  Harriet Butte, [place], PGY-2

## 2017-06-18 NOTE — Telephone Encounter (Signed)
Pt informed. Rudell Ortman, CMA  

## 2017-06-18 NOTE — Patient Instructions (Signed)
Take your Miralax 1-2 x daily.   Stay on fiber daily and increase your water intake daily.   Follow up with Dr. Fuller Plan as needed.   Normal BMI (Body Mass Index- based on height and weight) is between 19 and 25. Your BMI today is Body mass index is 33.61 kg/m. Marland Kitchen Please consider follow up  regarding your BMI with your Primary Care Provider.  Thank you for choosing me and [facility].  Pricilla Riffle. Dagoberto Ligas., MD., Marval Regal

## 2017-06-29 ENCOUNTER — Other Ambulatory Visit: Payer: Self-pay | Admitting: Family Medicine

## 2017-06-29 DIAGNOSIS — Z1231 Encounter for screening mammogram for malignant neoplasm of breast: Secondary | ICD-10-CM

## 2017-07-22 ENCOUNTER — Ambulatory Visit
Admission: RE | Admit: 2017-07-22 | Discharge: 2017-07-22 | Disposition: A | Discharge status: Home / Self Care | Payer: Medicaid Other | Source: Ambulatory Visit | Attending: Family Medicine | Admitting: Family Medicine

## 2017-07-22 DIAGNOSIS — Z1231 Encounter for screening mammogram for malignant neoplasm of breast: Secondary | ICD-10-CM | POA: Diagnosis not present

## 2017-08-01 ENCOUNTER — Other Ambulatory Visit: Payer: Self-pay | Admitting: Family Medicine

## 2017-08-01 DIAGNOSIS — R232 Flushing: Secondary | ICD-10-CM

## 2017-08-17 ENCOUNTER — Ambulatory Visit: Payer: Medicaid Other | Admitting: Diagnostic Neuroimaging

## 2017-08-21 ENCOUNTER — Ambulatory Visit: Payer: Medicaid Other | Admitting: Diagnostic Neuroimaging

## 2017-08-21 ENCOUNTER — Encounter: Payer: Self-pay | Admitting: Diagnostic Neuroimaging

## 2017-08-21 DIAGNOSIS — H814 Vertigo of central origin: Secondary | ICD-10-CM

## 2017-08-21 DIAGNOSIS — H8149 Vertigo of central origin, unspecified ear: Secondary | ICD-10-CM | POA: Diagnosis not present

## 2017-08-21 NOTE — Progress Notes (Signed)
[facility]  PATIENT: Amanda Dickerson DOB: Jul 08, 1960  REFERRING CLINICIAN: McMullen HISTORY FROM: patient  REASON FOR VISIT: new consult    HISTORICAL  CHIEF COMPLAINT:  Chief Complaint  Patient presents with  . Recurrent vertigo    rm 6, New Pt, "dizziness > 1 year to the point that I couldn't walk or get out of bed; have seen ENT; my mom has vertigo"    HISTORY OF PRESENT ILLNESS:   57 year old female here for evaluation of dizziness.  Patient has had dizziness for intermittently for the past 2 years.  She describes room spinning sensation, not triggered by any specific factor.  In the last 1 month patient had a more severe episode where she had 1 day of severe vertigo, room spinning sensation, where she was not able to get out of bed or walk.  No nausea or vomiting.  No double vision.  No slurred speech or numbness.  Symptoms resolved by the next day.  Patient has history of migraine headaches in the past.  Typically these are relieved with low-dose of ibuprofen.  She does have some sensitivity to light.  Patient's mother also has history of intermittent vertigo.   REVIEW OF SYSTEMS: Full 14 system review of systems performed and negative with exception of: headache fatigue increased thirst achign muscles.   ALLERGIES: Allergies  Allergen Reactions  . Ibuprofen Other (See Comments)    Upsets stomach  . Penicillins Nausea And Vomiting    HOME MEDICATIONS: Outpatient Medications Prior to Visit  Medication Sig Dispense Refill  . albuterol (PROAIR HFA) 108 (90 Base) MCG/ACT inhaler Inhale 2 puffs every 6 hours as needed for wheeze/shortness of breath 2 Inhaler 3  . cyclobenzaprine (FLEXERIL) 10 MG tablet TAKE 1/2 TO 1 TABLETS (5-10 MG TOTAL) BY MOUTH 3 (THREE) TIMES DAILY AS NEEDED. 30 tablet 2  . Fluocinolone Acetonide 0.01 % OIL INSTILL 5 DROPS TWICE DAILY INTO BOTH EARS FOR TWO WEEKS. USE AS NEEDED.  1  . meclizine (ANTIVERT) 25 MG tablet Take 1  tablet (25 mg total) by mouth 3 (three) times daily as needed for dizziness. 30 tablet 1  . omeprazole (PRILOSEC) 20 MG capsule Take 1 capsule (20 mg total) by mouth daily. 30 capsule 2  . polyethylene glycol powder (GLYCOLAX/MIRALAX) powder Mix 17 grams in 8 oz of water 1-2 x daily 255 g 5  . traZODone (DESYREL) 50 MG tablet Take 0.5-1 tablets (25-50 mg total) by mouth at bedtime as needed for sleep. 30 tablet 1  . venlafaxine (EFFEXOR) 75 MG tablet TAKE 1 TABLET BY MOUTH TWICE A DAY 60 tablet 1   No facility-administered medications prior to visit.     PAST MEDICAL HISTORY: Past Medical History:  Diagnosis Date  . Anemia   . Anxiety   . Arthritis    KNEES  . Asthma   . Colitis   . Dysfunctional uterine bleeding   . Fibromyalgia   . GERD (gastroesophageal reflux disease)   . Headache(784.0)   . History of cardiovascular stress test    ETT-Myoview 7/16:  EF 71%, no ischemia or infarct; Low Risk    PAST SURGICAL HISTORY: Past Surgical History:  Procedure Laterality Date  . CESAREAN SECTION  1988  . COLONOSCOPY    . ENDOMETRIAL ABLATION W/ NOVASURE  08/2011  . MULTIPLE TOOTH EXTRACTIONS    . TUBAL LIGATION  2000    FAMILY HISTORY: Family History  Problem Relation Age of Onset  . Hypertension Mother   .  Heart disease Mother   . Hyperlipidemia Mother   . Other Mother        vertigo  . Diabetes Father   . Prostate cancer Father     SOCIAL HISTORY:  Social History   Socioeconomic History  . Marital status: Legally Separated    Spouse name: Not on file  . Number of children: 1  . Years of education: 53  . Highest education level: Not on file  Occupational History  . Occupation: Disabled  Social Needs  . Financial resource strain: Not on file  . Food insecurity:    Worry: Not on file    Inability: Not on file  . Transportation needs:    Medical: Not on file    Non-medical: Not on file  Tobacco Use  . Smoking status: Never Smoker  . Smokeless tobacco: Never  Used  Substance and Sexual Activity  . Alcohol use: Yes    Comment: occasionally  . Drug use: No  . Sexual activity: Never    Birth control/protection: Surgical  Lifestyle  . Physical activity:    Days per week: Not on file    Minutes per session: Not on file  . Stress: Not on file  Relationships  . Social connections:    Talks on phone: Not on file    Gets together: Not on file    Attends religious service: Not on file    Active member of club or organization: Not on file    Attends meetings of clubs or organizations: Not on file    Relationship status: Not on file  . Intimate partner violence:    Fear of current or ex partner: Not on file    Emotionally abused: Not on file    Physically abused: Not on file    Forced sexual activity: Not on file  Other Topics Concern  . Not on file  Social History Narrative   Lives alone   Caffeine - rarely     PHYSICAL EXAM  GENERAL EXAM/CONSTITUTIONAL: Vitals:  Vitals:   08/21/17 0838  BP: 114/74  Pulse: 64  Weight: 202 lb 3.2 oz (91.7 kg)  Height: 5' 5"  (1.651 m)     Body mass index is 33.65 kg/m.  Visual Acuity Screening   Right eye Left eye Both eyes  Without correction: 20/40 20/30   With correction:        Patient is in no distress; well developed, nourished and groomed; neck is supple  CARDIOVASCULAR:  Examination of carotid arteries is normal; no carotid bruits  Regular rate and rhythm, no murmurs  Examination of peripheral vascular system by observation and palpation is normal  EYES:  Ophthalmoscopic exam of optic discs and posterior segments is normal; no papilledema or hemorrhages  MUSCULOSKELETAL:  Gait, strength, tone, movements noted in Neurologic exam below  NEUROLOGIC: MENTAL STATUS:  No flowsheet data found.  awake, alert, oriented to person, place and time  recent and remote memory intact  normal attention and concentration  language fluent, comprehension intact, naming intact,    fund of knowledge appropriate  CRANIAL NERVE:   2nd - no papilledema on fundoscopic exam  2nd, 3rd, 4th, 6th - pupils equal and reactive to light, visual fields full to confrontation, extraocular muscles intact, no nystagmus  5th - facial sensation symmetric  7th - facial strength symmetric  8th - hearing intact  9th - palate elevates symmetrically, uvula midline  11th - shoulder shrug symmetric  12th - tongue protrusion midline  MOTOR:  normal bulk and tone, full strength in the BUE, BLE  SENSORY:   normal and symmetric to light touch, pinprick, temperature, vibration  COORDINATION:   finger-nose-finger, fine finger movements normal  REFLEXES:   deep tendon reflexes present and symmetric  GAIT/STATION:   narrow based gait; able to walk tandem; romberg is negative    DIAGNOSTIC DATA (LABS, IMAGING, TESTING) - I reviewed patient records, labs, notes, testing and imaging myself where available.  Lab Results  Component Value Date   WBC 4.0 05/20/2017   HGB 13.8 05/20/2017   HCT 40.6 05/20/2017   MCV 83 05/20/2017   PLT 279 05/20/2017      Component Value Date/Time   NA 142 05/20/2017 0940   K 4.3 05/20/2017 0940   CL 104 05/20/2017 0940   CO2 24 05/20/2017 0940   GLUCOSE 111 (H) 05/20/2017 0940   GLUCOSE 121 (H) 07/26/2015 0918   BUN 8 05/20/2017 0940   CREATININE 0.80 05/20/2017 0940   CREATININE 0.72 07/26/2015 0918   CALCIUM 9.2 05/20/2017 0940   PROT 7.1 07/25/2014 0910   ALBUMIN 4.1 07/25/2014 0910   AST 24 07/25/2014 0910   ALT 27 07/25/2014 0910   ALKPHOS 83 07/25/2014 0910   BILITOT 0.5 07/25/2014 0910   GFRNONAA 83 05/20/2017 0940   GFRNONAA >89 07/26/2015 0918   GFRAA 96 05/20/2017 0940   GFRAA >89 07/26/2015 0918   Lab Results  Component Value Date   CHOL 226 (H) 07/26/2015   HDL 67 07/26/2015   LDLCALC 131 (H) 07/26/2015   TRIG 140 07/26/2015   CHOLHDL 3.4 07/26/2015   Lab Results  Component Value Date   HGBA1C 6.3  05/20/2017   No results found for: QASUORVI15 Lab Results  Component Value Date   TSH 1.840 05/20/2017        ASSESSMENT AND PLAN  57 y.o. year old female here with intermittent vertigo x 2 years; with more severe attack 1 month ago.    Ddx: central vestibulopathy vs peripheral vestibulopathy vs vestibular migraine  1. Vertigo of central origin, unspecified laterality      PLAN:  - consider MRI brain / IAC (with and without) --> patient is reluctant to take IV dye and wants to hold off for now - consider vestibular PT if symptoms recur - use meclizine as needed  Return if symptoms worsen or fail to improve, for return to PCP.    Penni Bombard, MD 3/79/4327, 6:14 AM Certified in Neurology, Neurophysiology and Neuroimaging  [facility] [address], [place], [place] [address] [phone]

## 2017-08-21 NOTE — Patient Instructions (Signed)
-   monitor symptoms

## 2017-08-31 ENCOUNTER — Other Ambulatory Visit: Payer: Self-pay | Admitting: Family Medicine

## 2017-08-31 DIAGNOSIS — K219 Gastro-esophageal reflux disease without esophagitis: Secondary | ICD-10-CM

## 2017-10-07 ENCOUNTER — Other Ambulatory Visit: Payer: Self-pay | Admitting: Family Medicine

## 2017-10-07 DIAGNOSIS — R232 Flushing: Secondary | ICD-10-CM

## 2017-10-09 DIAGNOSIS — H5203 Hypermetropia, bilateral: Secondary | ICD-10-CM | POA: Diagnosis not present

## 2017-10-13 DIAGNOSIS — H5213 Myopia, bilateral: Secondary | ICD-10-CM | POA: Diagnosis not present

## 2017-10-26 DIAGNOSIS — H524 Presbyopia: Secondary | ICD-10-CM | POA: Diagnosis not present

## 2017-12-01 ENCOUNTER — Other Ambulatory Visit: Payer: Self-pay | Admitting: Family Medicine

## 2017-12-01 DIAGNOSIS — R232 Flushing: Secondary | ICD-10-CM

## 2018-01-11 ENCOUNTER — Encounter (HOSPITAL_COMMUNITY): Payer: Self-pay | Admitting: Emergency Medicine

## 2018-01-11 ENCOUNTER — Ambulatory Visit (HOSPITAL_COMMUNITY)
Admission: EM | Admit: 2018-01-11 | Discharge: 2018-01-11 | Disposition: A | Discharge status: Home / Self Care | Payer: Medicaid Other | Attending: Family Medicine | Admitting: Family Medicine

## 2018-01-11 DIAGNOSIS — L03116 Cellulitis of left lower limb: Secondary | ICD-10-CM

## 2018-01-11 MED ORDER — DOXYCYCLINE HYCLATE 100 MG PO CAPS: 100.0000 mg | ORAL_CAPSULE | Freq: Two times a day (BID) | ORAL | 0 refills | Status: AC

## 2018-01-11 NOTE — Discharge Instructions (Signed)
Please begin taking doxycycline twice daily for the next 10 days Please monitor redness, swelling and pain, if symptoms worsening and spreading or not healing with antibiotic provided please follow-up

## 2018-01-11 NOTE — ED Provider Notes (Signed)
[facility]    CSN: [id number] Arrival date & time: 01/11/18  0935     History   Chief Complaint Chief Complaint  Patient presents with  . Abscess    HPI ELBIA PARO is a 57 y.o. female history of fibromyalgia, GERD, presenting today for evaluation of area of redness on skin.  Patient states that on Friday she noticed 2 small bumps to her right wrist and right elbow that were slightly red.  She has noticed these relatively resolved, but over the past couple days she has noticed worsening redness to her left upper thigh.  Minimal associated itchiness.  Minimal pain.  Denies any known bites or ticks recently.  Denies fevers and headaches.  Denies drainage.  HPI  Past Medical History:  Diagnosis Date  . Anemia   . Anxiety   . Arthritis    KNEES  . Asthma   . Colitis   . Dysfunctional uterine bleeding   . Fibromyalgia   . GERD (gastroesophageal reflux disease)   . Headache(784.0)   . History of cardiovascular stress test    ETT-Myoview 7/16:  EF 71%, no ischemia or infarct; Low Risk    Patient Active Problem List   Diagnosis Date Noted  . Shoulder blade pain 07/31/2016  . Hyperpigmentation of skin 07/26/2015  . Episodic recurrent vertigo 07/26/2015  . Hyperlipidemia 07/26/2015  . Pre-diabetes 07/26/2015  . Obesity 07/26/2015  . Metatarsalgia of right foot 12/19/2014  . 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 GERD (gastroesophageal reflux disease) 11/29/2013  . Rectal bleeding 10/03/2013  . Greater trochanteric bursitis of right hip 09/16/2013  . Asthma, mild intermittent 10/25/2012  . Posterior tibial tendinitis 07/30/2012  . Fibromyalgia 07/18/2011    Past Surgical History:  Procedure Laterality Date  . CESAREAN SECTION  1988  . COLONOSCOPY    . ENDOMETRIAL ABLATION W/ NOVASURE  08/2011  . MULTIPLE TOOTH EXTRACTIONS    . TUBAL LIGATION  2000    OB History    Gravida  1   Para  1   Term  1   Preterm      AB      Living  1     SAB      TAB      Ectopic      Multiple      Live Births               Home Medications    Prior to Admission medications   Medication Sig Start Date End Date Taking? Authorizing Provider  albuterol [facility] HFA) 108 (90 Base) MCG/ACT inhaler Inhale 2 puffs every 6 hours as needed for wheeze/shortness of breath 06/09/17   [place] Bing, DO  cyclobenzaprine (FLEXERIL) 10 MG tablet TAKE 1/2 TO 1 TABLETS (5-10 MG TOTAL) BY MOUTH 3 (THREE) TIMES DAILY AS NEEDED. 04/10/17   [place] Bing, DO  doxycycline (VIBRAMYCIN) 100 MG capsule Take 1 capsule (100 mg total) by mouth 2 (two) times daily for 10 days. 01/11/18 01/21/18  [place] Moya C, PA-C  Fluocinolone Acetonide 0.01 % OIL INSTILL 5 DROPS TWICE DAILY INTO BOTH EARS FOR TWO WEEKS. USE AS NEEDED. 01/02/15   [provider]  meclizine (ANTIVERT) 25 MG tablet Take 1 tablet (25 mg total) by mouth 3 (three) times daily as needed for dizziness. 05/20/17   [place]ner Mould, MD  omeprazole (PRILOSEC) 20 MG capsule TAKE 1 CAPSULE BY MOUTH EVERY DAY 08/31/17   [place] Bing, DO  polyethylene glycol powder (GLYCOLAX/MIRALAX) powder Mix 17 grams in 8 oz of water 1-2 x daily 12/10/15   Ladene Artist, MD  traZODone (DESYREL) 50 MG tablet Take 0.5-1 tablets (25-50 mg total) by mouth at bedtime as needed for sleep. 10/06/16   Steve Rattler, DO  venlafaxine (EFFEXOR) 75 MG tablet TAKE 1 TABLET BY MOUTH TWICE A DAY 12/01/17   [place] Bing, DO    Family History Family History  Problem Relation Age of Onset  . Hypertension Mother   . Heart disease Mother   . Hyperlipidemia Mother   . Other Mother        vertigo  . Diabetes Father   . Prostate cancer Father     Social History Social History   Tobacco Use  . Smoking status: Never Smoker  . Smokeless tobacco: Never Used  Substance Use Topics  . Alcohol use: Yes    Comment: occasionally  . Drug use: No     Allergies   Ibuprofen and  Penicillins   Review of Systems Review of Systems  Constitutional: Negative for fatigue and fever.  HENT: Negative for mouth sores.   Eyes: Negative for visual disturbance.  Respiratory: Negative for shortness of breath.   Cardiovascular: Negative for chest pain.  Gastrointestinal: Negative for abdominal pain, nausea and vomiting.  Genitourinary: Negative for genital sores.  Musculoskeletal: Negative for arthralgias and joint swelling.  Skin: Positive for color change. Negative for rash and wound.  Neurological: Negative for dizziness, weakness, light-headedness and headaches.     Physical Exam Triage Vital Signs ED Triage Vitals [01/11/18 1026]  Enc Vitals Group     BP 139/73     Pulse Rate 64     Resp 18     Temp 98.4 F (36.9 C)     Temp Source Oral     SpO2 100 %     Weight      Height      Head Circumference      Peak Flow      Pain Score 6     Pain Loc      Pain Edu?      Excl. in [place]?    No data found.  Updated Vital Signs BP 139/73 (BP Location: Right Arm)   Pulse 64   Temp 98.4 F (36.9 C) (Oral)   Resp 18   SpO2 100%   Visual Acuity Right Eye Distance:   Left Eye Distance:   Bilateral Distance:    Right Eye Near:   Left Eye Near:    Bilateral Near:     Physical Exam  Constitutional: She is oriented to person, place, and time. She appears well-developed and well-nourished.  No acute distress  HENT:  Head: Normocephalic and atraumatic.  Nose: Nose normal.  Eyes: Conjunctivae are normal.  Neck: Neck supple.  Cardiovascular: Normal rate.  Pulmonary/Chest: Effort normal. No respiratory distress.  Abdominal: She exhibits no distension.  Musculoskeletal: Normal range of motion.  Neurological: She is alert and oriented to person, place, and time.  Skin: Skin is warm and dry.  4 cm by 6.75 cm area of significant erythema to left upper anterior thigh, no induration or fluctuance, minimal increase in length  Psychiatric: She has a normal mood and  affect.  Nursing note and vitals reviewed.    UC Treatments / Results  Labs (all labs ordered are listed, but only abnormal results are displayed) Labs Reviewed - No data to display  EKG None  Radiology No results found.  Procedures Procedures (including critical care time)  Medications Ordered in UC Medications - No data to display  Initial Impression / Assessment and Plan / UC Course  I have reviewed the triage vital signs and the nursing notes.  Pertinent labs & imaging results that were available during my care of the patient were reviewed by me and considered in my medical decision making (see chart for details).     Concerning for cellulitis, will treat with doxycycline twice daily x10 days.  Will have patient continue to monitor symptoms and follow-up if not resolving. Final Clinical Impressions(s) / UC Diagnoses   Final diagnoses:  Cellulitis of left lower extremity     Discharge Instructions     Please begin taking doxycycline twice daily for the next 10 days Please monitor redness, swelling and pain, if symptoms worsening and spreading or not healing with antibiotic provided please follow-up   ED Prescriptions    Medication Sig Dispense Auth. Provider   doxycycline (VIBRAMYCIN) 100 MG capsule Take 1 capsule (100 mg total) by mouth 2 (two) times daily for 10 days. 20 capsule Daisee Centner C, PA-C     Controlled Substance Prescriptions [place] Controlled Substance Registry consulted? Not Applicable   Janith Lima, [place] 01/11/18 1045

## 2018-01-11 NOTE — ED Triage Notes (Signed)
Pt c/o abscess to multiple areas of elbow and wrist and now one on leg with redness

## 2018-02-01 ENCOUNTER — Other Ambulatory Visit: Payer: Self-pay

## 2018-02-01 ENCOUNTER — Other Ambulatory Visit: Payer: Self-pay | Admitting: Family Medicine

## 2018-02-01 DIAGNOSIS — J4520 Mild intermittent asthma, uncomplicated: Secondary | ICD-10-CM

## 2018-02-01 DIAGNOSIS — R232 Flushing: Secondary | ICD-10-CM

## 2018-02-02 MED ORDER — ALBUTEROL SULFATE HFA 108 (90 BASE) MCG/ACT IN AERS: INHALATION_SPRAY | RESPIRATORY_TRACT | 3 refills | Status: DC

## 2018-02-12 ENCOUNTER — Encounter: Payer: Self-pay | Admitting: Family Medicine

## 2018-02-12 ENCOUNTER — Other Ambulatory Visit: Payer: Self-pay

## 2018-02-12 ENCOUNTER — Ambulatory Visit: Payer: Medicaid Other | Admitting: Family Medicine

## 2018-02-12 VITALS — BP 136/80 | HR 69 | Temp 98.5°F | Wt 196.8 lb

## 2018-02-12 DIAGNOSIS — R7303 Prediabetes: Secondary | ICD-10-CM | POA: Diagnosis not present

## 2018-02-12 DIAGNOSIS — R21 Rash and other nonspecific skin eruption: Secondary | ICD-10-CM

## 2018-02-12 LAB — POCT GLYCOSYLATED HEMOGLOBIN (HGB A1C): HBA1C, POC (CONTROLLED DIABETIC RANGE): 6 % (ref 0.0–7.0)

## 2018-02-12 MED ORDER — HYDROCORTISONE 1 % EX CREA: 1.0000 "application " | TOPICAL_CREAM | Freq: Two times a day (BID) | CUTANEOUS | 0 refills | Status: DC | PRN

## 2018-02-12 NOTE — Progress Notes (Signed)
Date of Visit: 02/12/2018   HPI:  Patient presents today to discuss rash.  Has had two bumps, one on R wrist, and another near L elbow which have been very itchy. L elbow came first. Has been applying neosporin to them. Has avoided scratching. Also thinks she has a rash beneath her breasts on both sides.  Requests A1c due to history of prediabetes  ROS: See HPI.  [place]: history of asthma, vertigo, GERD, HSV2, hyperlipidemia, preDM, obesity, fibromyalgia  PHYSICAL EXAM: BP 136/80   Pulse 69   Temp 98.5 F (36.9 C) (Oral)   Wt 196 lb 12.8 oz (89.3 kg)   SpO2 98%   BMI 32.75 kg/m  Gen: no acute distress, pleasant, cooperative, well appearing Skin: no visible rash seen beneath breasts bilaterally (perhaps some faint erythema beneath R breast, but very faint). One small erythematous papule on R wrist and another near L elbow. No surrounding erythema, drainage, or purulence.  ASSESSMENT/PLAN:  Health maintenance:  -offered flu shot but patient declined -patient also decline Tdap today but would likely consider it in the future  Rash - the two spots look like bug bites. Advised they do not appear infected and do not need antibiotics at this time. Stop neosporin. rx hydrocortisone. I see very little rash beneath R breast but she can apply hydrocortisone there to see if it helps.  Pre-diabetes A1c 6.0 today, informed patient of result.  FOLLOW UP: Follow up as needed if symptoms worsen or do not improve.   [place]. Ardelia Mems, [place]

## 2018-02-12 NOTE — Patient Instructions (Signed)
It was nice to meet you today!  Apply the hydrocortisone cream to the rash when it comes up  A1c today 6.0 - good job!  Think about the tetanus shot. Recommended every 10 years.  Be well, Dr. Ardelia Mems

## 2018-02-13 NOTE — Assessment & Plan Note (Signed)
A1c 6.0 today, informed patient of result.

## 2018-04-08 ENCOUNTER — Other Ambulatory Visit: Payer: Self-pay | Admitting: Family Medicine

## 2018-04-08 DIAGNOSIS — R232 Flushing: Secondary | ICD-10-CM

## 2018-06-04 ENCOUNTER — Other Ambulatory Visit: Payer: Self-pay | Admitting: Family Medicine

## 2018-06-04 DIAGNOSIS — R232 Flushing: Secondary | ICD-10-CM

## 2018-08-06 ENCOUNTER — Other Ambulatory Visit: Payer: Self-pay | Admitting: Family Medicine

## 2018-08-06 DIAGNOSIS — R232 Flushing: Secondary | ICD-10-CM

## 2018-08-06 DIAGNOSIS — J4520 Mild intermittent asthma, uncomplicated: Secondary | ICD-10-CM

## 2018-10-08 ENCOUNTER — Other Ambulatory Visit: Payer: Self-pay

## 2018-10-08 DIAGNOSIS — R232 Flushing: Secondary | ICD-10-CM

## 2018-10-09 MED ORDER — VENLAFAXINE HCL 75 MG PO TABS: 75.0000 mg | ORAL_TABLET | Freq: Two times a day (BID) | ORAL | 1 refills | Status: DC

## 2018-10-09 NOTE — Telephone Encounter (Signed)
Will refill but please have patient schedule appointment as it has been >1 year since last seen and opportunity to meet new PCP.

## 2018-10-11 NOTE — Telephone Encounter (Signed)
Pt scheduled for an appt. Deseree Blount, CMA  

## 2018-10-20 ENCOUNTER — Ambulatory Visit: Payer: Medicaid Other | Admitting: Family Medicine

## 2018-10-20 ENCOUNTER — Other Ambulatory Visit: Payer: Self-pay

## 2018-10-20 ENCOUNTER — Encounter: Payer: Self-pay | Admitting: Family Medicine

## 2018-10-20 VITALS — BP 118/80 | HR 63

## 2018-10-20 DIAGNOSIS — M797 Fibromyalgia: Secondary | ICD-10-CM

## 2018-10-20 DIAGNOSIS — R7303 Prediabetes: Secondary | ICD-10-CM

## 2018-10-20 DIAGNOSIS — S50862A Insect bite (nonvenomous) of left forearm, initial encounter: Secondary | ICD-10-CM | POA: Diagnosis not present

## 2018-10-20 DIAGNOSIS — E782 Mixed hyperlipidemia: Secondary | ICD-10-CM | POA: Diagnosis not present

## 2018-10-20 DIAGNOSIS — W57XXXA Bitten or stung by nonvenomous insect and other nonvenomous arthropods, initial encounter: Secondary | ICD-10-CM

## 2018-10-20 DIAGNOSIS — Z7689 Persons encountering health services in other specified circumstances: Secondary | ICD-10-CM

## 2018-10-20 LAB — POCT GLYCOSYLATED HEMOGLOBIN (HGB A1C): HbA1c, POC (controlled diabetic range): 6.1 % (ref 0.0–7.0)

## 2018-10-20 NOTE — Progress Notes (Signed)
   Subjective:   Patient ID: Amanda Dickerson    DOB: 06/10/1960, 58 y.o. female   MRN: [id number]  Amanda Dickerson is a 58 y.o. female with a history of asthma, GERD, vertigo, fibromyalgia, h/o HSV2, HLD, pre-diabetes, obesity  here for regular check up and "bug bites".  "Bug bites" Several spots on let arm and ankles bilaterally. Itch occasionally. Recently got a new mattress. No sores on hands or between fingers. Denies any pets in the house. Denies any spots along waist. Denies any bleeding or puss.  Pre-diabetes: Last A1C 6.0. Diet controlled. Denies any polyphagia, polydipsia, polyuria. She notes she uses her stationary bike occasionally.    HLD: Last lipid panel >3 years ago with elevated LDL. Not currently on cholesterol lowering medicaiton.   Health Maintenance:  Due for TDAP and flu vaccine.   Review of Systems:  Per HPI.   [place], medications and smoking status reviewed.  Objective:   BP 118/80   Pulse 63   SpO2 99%  Vitals and nursing note reviewed.  General: well nourished, well developed, in no acute distress with non-toxic appearance, sitting comfortably in exam chair CV: regular rate and rhythm without murmurs, rubs, or gallops, no lower extremity edema Lungs: clear to auscultation bilaterally with normal work of breathing Abdomen: soft, non-tender, non-distended, normoactive bowel sounds Skin: warm, dry, few small vesicles with circumferential erythema on left distal posterior forearm Extremities: warm and well perfused Neuro: Alert and oriented, speech normal  Assessment & Plan:   Pre-diabetes A1C 6.1 today. Patient informed of result.  - continue diet and exercise - repeat A1C 6-12 months - BMP ordered to monitor kidney function   Hyperlipidemia Lipid panel obtained today. No currently on cholesterol lowering medication. ASCVD 18yrrisk 2.1%, thus statin therapy not recommended at this time. - patient informed of results - yearly lipid panel -  encouraged diet and exercise   Arthropod bite of forearm, left, initial encounter No signs of infection. Reassurance provided.  - Recommend OTC steroid cream if needed for itching.  - Continue to monitor.  - RTC if worsens or signs of infection.  - Patient understood and agreed to plan.   Health Maintenance: Due to flu vaccine and TDAP - RTC for flu vaccine when available - Will offer TDAP at follow up visit given unavailable during clinic today  Orders Placed This Encounter  Procedures  . Lipid Panel  . Basic Metabolic Panel  . HgB A1c   KMina Marble DO PGY-2, [facility] 10/21/2018 8:15 PM

## 2018-10-21 ENCOUNTER — Encounter: Payer: Self-pay | Admitting: Family Medicine

## 2018-10-21 DIAGNOSIS — S50862A Insect bite (nonvenomous) of left forearm, initial encounter: Secondary | ICD-10-CM | POA: Insufficient documentation

## 2018-10-21 DIAGNOSIS — W57XXXA Bitten or stung by nonvenomous insect and other nonvenomous arthropods, initial encounter: Secondary | ICD-10-CM | POA: Insufficient documentation

## 2018-10-21 LAB — BASIC METABOLIC PANEL
BUN/Creatinine Ratio: 18 (ref 9–23)
BUN: 13 mg/dL (ref 6–24)
CO2: 23 mmol/L (ref 20–29)
Calcium: 9 mg/dL (ref 8.7–10.2)
Chloride: 106 mmol/L (ref 96–106)
Creatinine, Ser: 0.73 mg/dL (ref 0.57–1.00)
GFR calc Af Amer: 106 mL/min/{1.73_m2} (ref >59)
GFR calc non Af Amer: 92 mL/min/{1.73_m2} (ref >59)
Glucose: 109 mg/dL — ABNORMAL HIGH (ref 65–99)
Potassium: 4.2 mmol/L (ref 3.5–5.2)
Sodium: 142 mmol/L (ref 134–144)

## 2018-10-21 LAB — LIPID PANEL
Chol/HDL Ratio: 2.7 ratio (ref 0.0–4.4)
Cholesterol, Total: 195 mg/dL (ref 100–199)
HDL: 71 mg/dL (ref >39)
LDL Calculated: 98 mg/dL (ref 0–99)
Triglycerides: 131 mg/dL (ref 0–149)
VLDL Cholesterol Cal: 26 mg/dL (ref 5–40)

## 2018-10-21 NOTE — Assessment & Plan Note (Signed)
A1C 6.1 today. Patient informed of result.  - continue diet and exercise - repeat A1C 6-12 months - BMP ordered to monitor kidney function

## 2018-10-21 NOTE — Assessment & Plan Note (Signed)
Lipid panel obtained today. No currently on cholesterol lowering medication. ASCVD 62yrrisk 2.1%, thus statin therapy not recommended at this time. - patient informed of results - yearly lipid panel - encouraged diet and exercise

## 2018-10-21 NOTE — Assessment & Plan Note (Signed)
No signs of infection. Reassurance provided.  - Recommend OTC steroid cream if needed for itching.  - Continue to monitor.  - RTC if worsens or signs of infection.  - Patient understood and agreed to plan.

## 2018-12-10 ENCOUNTER — Other Ambulatory Visit: Payer: Self-pay | Admitting: Family Medicine

## 2018-12-10 DIAGNOSIS — R232 Flushing: Secondary | ICD-10-CM

## 2019-01-07 ENCOUNTER — Ambulatory Visit: Payer: Medicaid Other | Admitting: Family Medicine

## 2019-01-07 ENCOUNTER — Other Ambulatory Visit: Payer: Self-pay

## 2019-01-07 ENCOUNTER — Encounter: Payer: Self-pay | Admitting: Nurse Practitioner

## 2019-01-07 ENCOUNTER — Encounter: Payer: Self-pay | Admitting: Family Medicine

## 2019-01-07 VITALS — BP 138/82 | HR 67 | Wt 191.0 lb

## 2019-01-07 DIAGNOSIS — K219 Gastro-esophageal reflux disease without esophagitis: Secondary | ICD-10-CM

## 2019-01-07 DIAGNOSIS — R112 Nausea with vomiting, unspecified: Secondary | ICD-10-CM | POA: Diagnosis not present

## 2019-01-07 MED ORDER — OMEPRAZOLE 20 MG PO CPDR: DELAYED_RELEASE_CAPSULE | ORAL | 3 refills | Status: DC

## 2019-01-07 NOTE — Patient Instructions (Signed)
It was very nice to meet you today. Please enjoy the rest of your week. Today you were seen for nausea and vomiting. I have prescribed omeprazole 68m to take daily. Follow up in 2 weeks for a follow up visit or sooner if needed.   Please call the clinic at [phone] your symptoms worsen or you have any concerns. It was our pleasure to serve you.

## 2019-01-07 NOTE — Progress Notes (Signed)
   Subjective:    Patient ID: Amanda Dickerson, female    DOB: May 12, 1960, 58 y.o.   MRN: [id number]   CC: Nausea and vomiting  HPI: Ms. Mishkin presents today because of recent vomiting for the past 2 days.  One episode of vomiting occurred Wednesday evening as well as Thursday morning both nonbloody nonbilious after eating.  She has also having nausea for a month and a half and continues to feel sick on her stomach even after MiraLAX.  She also has no she ate it aversion to smells that make her nauseous as well.  Last night she has was able to keep soup down.  Even though she has stopped taking the MiraLAX she continues to have regular bowel movements every 2 to 3 days.  She does endorse feeling tired and cold.  Denies recent sick contacts, headache, shortness of breath, abdominal pain, diarrhea.  Last colonoscopy was 6 years ago with colitis and was suggestive of Crohn's but was not started on medication due to lack of symptoms.  She has had an endoscopy in 2007 for anemia positive FOBT with signs of acid reflux and to continue PPI indefinitely.  She has recently stopped taking her PPI and we will restart that.  Smoking status reviewed  Review of Systems Per HPI, also denies recent illness, fever, headache, changes in vision, chest pain, shortness of breath, abdominal pain, weakness   Patient Active Problem List   Diagnosis Date Noted  . Arthropod bite of forearm, left, initial encounter 10/21/2018  . Episodic recurrent vertigo 07/26/2015  . Hyperlipidemia 07/26/2015  . Pre-diabetes 07/26/2015  . Obesity 07/26/2015  . GERD (gastroesophageal reflux disease) 11/29/2013  . Asthma, mild intermittent 10/25/2012  . Fibromyalgia 07/18/2011     Objective:  BP 138/82   Pulse 67   Wt 191 lb (86.6 kg)   SpO2 99%   BMI 31.78 kg/m  Vitals and nursing note reviewed  General: Appears well, no acute distress. Age appropriate. Cardiac: RRR, normal heart sounds, no murmurs Respiratory: CTAB,  normal effort Abdomen: soft, nontender, nondistended. NABS. No organomegaly. Extremities: No edema or cyanosis. Skin: Warm and dry, no rashes noted Neuro: alert and oriented, no focal deficits Psych: normal affect  Assessment & Plan:    Vomiting Acute vomiting after eating with nausea in the setting of non compliance with PPI gives suspicion for gastric ulcer. She also has some constitutional signs of anemia and a gastric ucler could be the source of her symptoms. Given her GI history I will refer for upper endoscopy.  -Refer to GI for endoscopy  Gastroesophageal reflux disease Non-compliance with PPI. After last endoscopy in 2007 PPI indefinitely was suggested.  -Restart omeprazole 9m qd -Consider increasing dose -f/u in 2 weeks or sooner if needed    SGerlene Fee DBayou L'OursePGY-1   *Discussed patient with Fellow Dr. CPhill Myron

## 2019-01-10 DIAGNOSIS — R1110 Vomiting, unspecified: Secondary | ICD-10-CM | POA: Insufficient documentation

## 2019-01-10 NOTE — Assessment & Plan Note (Signed)
Acute vomiting after eating with nausea in the setting of non compliance with PPI gives suspicion for gastric ulcer. She also has some constitutional signs of anemia and a gastric ucler could be the source of her symptoms. Given her GI history I will refer for upper endoscopy.  -Refer to GI for endoscopy

## 2019-01-10 NOTE — Assessment & Plan Note (Signed)
Non-compliance with PPI. After last endoscopy in 2007 PPI indefinitely was suggested.  -Restart omeprazole 58m qd -Consider increasing dose -f/u in 2 weeks or sooner if needed

## 2019-01-16 NOTE — Progress Notes (Addendum)
01/16/2019 Amanda Dickerson [id number] [birth year]   HISTORY OF PRESENT ILLNESS: Amanda Dickerson is a 58 year old female with a past medical history of anxiety, fibromyalgia, asthma, colitis and GERD. She presents today for further evaluation for nausea and vomiting. She reports having nausea which started in the summer of this year.  No specific food or stress triggers.  Infrequent vomiting. Her nausea has progressively worsened over the past 3 months.  No upper or lower abdominal pain.  Approximately 2 weeks ago, she vomited twice in the morning and twice at nighttime for 2 consecutive days.  Her emesis consisted of clear secretions in the morning and partially digested food when she vomited at nighttime.  She was seen by her PCP who prescribed Omeprazole 20 mg once daily and her nausea has significantly improved over the past 2 weeks.  No further vomiting.  She is passing a normal formed brown bowel movement every 2 to 3 days.  She reported seeing a small amount of bright red blood on the toilet paper after passing a normal bowel movement daily throughout the months of September and October 2020.  She was possibly mildly constipated during this time.  No associated anal rectal pain.  No further rectal bleeding for at least the past 2 weeks.  She underwent a colonoscopy 10/25/2013 by Dr. 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  She was asymptomatic at that time therefore she did not require treatment.  She takes Aleve 1 tab as needed for headaches, approximately 3 doses in the past month.  Infrequent alcohol use.  She drinks 1 cup of coffee once or twice monthly.  She has lost 10 pounds over the past 6 to 12 months which she attributes to eating less secondary to having nausea.  No fever, sweats or chills.  She was last seen in the office by Dr. Fuller Plan on 06/18/2017 with constipation with one episode of rectal bleeding which resolved.  A colonoscopy was recommended if her  rectal bleeding recurred or she developed any change in bowel pattern or weight loss.  No family history of IBD or colorectal cancer.  Colonoscopy 10/27/2013 by Dr. Fuller Plan: 1. Abnormal mucosa in the sigmoid colon and rectum, mucosa had patchy erythematous and friable mucosa. Biopsies showed minimally active chronic colitis with scattered granulomatous inflammation suggestive of Crohn's disease.  No dysplasia.  2. Small internal hemorrhoids.   Colonoscopy 02/17/2006 by Dr. Fuller Plan due to heme + stool and anemia:  Internal hemorrhoids otherwise normal.  EGD 02/17/2006 by Dr. Fuller Plan done due to heme + stool and  anemia:  Normal.    Past Medical History:  Diagnosis Date   Anemia    Anxiety    Arthritis    KNEES   Asthma    Colitis    Dysfunctional uterine bleeding    Fibromyalgia    GERD (gastroesophageal reflux disease)    Headache(784.0)    History of cardiovascular stress test    ETT-Myoview 7/16:  EF 71%, no ischemia or infarct; Low Risk   Past Surgical History:  Procedure Laterality Date   CESAREAN SECTION  1988   COLONOSCOPY     ENDOMETRIAL ABLATION W/ NOVASURE  08/2011   MULTIPLE TOOTH EXTRACTIONS     TUBAL LIGATION  2000    reports that she has never smoked. She has never used smokeless tobacco. She reports current alcohol use. She reports that she does not use drugs. family history includes Diabetes in her father; Heart disease  in her mother; Hyperlipidemia in her mother; Hypertension in her mother; Other in her mother; Prostate cancer in her father. Allergies  Allergen Reactions   Ibuprofen Other (See Comments)    Upsets stomach   Penicillins Nausea And Vomiting      Outpatient Encounter Medications as of 01/18/2019  Medication Sig   albuterol (PROAIR HFA) 108 (90 Base) MCG/ACT inhaler INHALE 2 PUFFS EVERY 6 HOURS AS NEEDED FOR WHEEZE/SHORTNESS OF BREATH   cyclobenzaprine (FLEXERIL) 10 MG tablet TAKE 1/2 TO 1 TABLETS (5-10 MG TOTAL) BY MOUTH 3  (THREE) TIMES DAILY AS NEEDED.   Fluocinolone Acetonide 0.01 % OIL INSTILL 5 DROPS TWICE DAILY INTO BOTH EARS FOR TWO WEEKS. USE AS NEEDED.   hydrocortisone cream 1 % Apply 1 application topically 2 (two) times daily as needed for itching.   meclizine (ANTIVERT) 25 MG tablet Take 1 tablet (25 mg total) by mouth 3 (three) times daily as needed for dizziness.   omeprazole (PRILOSEC) 20 MG capsule TAKE 1 CAPSULE BY MOUTH EVERY DAY   polyethylene glycol powder (GLYCOLAX/MIRALAX) powder Mix 17 grams in 8 oz of water 1-2 x daily   traZODone (DESYREL) 50 MG tablet Take 0.5-1 tablets (25-50 mg total) by mouth at bedtime as needed for sleep.   venlafaxine (EFFEXOR) 75 MG tablet TAKE 1 TABLET BY MOUTH TWICE A DAY   No facility-administered encounter medications on file as of 01/18/2019.    REVIEW OF SYSTEMS  : All other systems reviewed and negative except where noted in the History of Present Illness.   PHYSICAL EXAM: BP 127/70    Pulse 73    Temp (!) 97 F (36.1 C)    Ht 5' 5"  (1.651 m)    Wt 192 lb (87.1 kg)    BMI 31.95 kg/m   General: Well developed white female in no acute distress Head: Normocephalic and atraumatic Eyes:  sclerae anicteric,conjunctive pink. Ears: Normal auditory acuity Mouth: Dentition intact. Neck: Supple, no masses.  Lungs: Clear throughout to auscultation Heart: Regular rate and rhythm Abdomen: Soft, nontender, non distended. No masses or hepatomegaly noted. Normal bowel sounds Rectal: Deferred.  Musculoskeletal: Symmetrical with no gross deformities  Skin: No lesions on visible extremities Extremities: No edema  Neurological: Alert oriented x 4, grossly nonfocal Cervical Nodes:  No significant cervical adenopathy Psychological:  Alert and cooperative. Normal mood and affect  ASSESSMENT AND PLAN:  5. 58 year old female with nausea, vomiting -abdominal sonogram to assess the gallbladder -CBC, CMP, CRP, Lipase  -EGD benefits and risk discussed including  risk with sedation, risk of bleeding, perforation and infection -Continue omeprazole 20 mg once daily  2. GERD -See plan in #1  3.  Rectal bleeding.  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 -Colonoscopy benefits and risk discussed including risk of sedation, risk of bleeding, perforation infection  4. Constipation -MiraLAX as needed  Further follow-up to be determined after the above evaluation has been completed     CC:  McDiarmid, Blane Ohara, MD

## 2019-01-18 ENCOUNTER — Ambulatory Visit: Payer: Medicaid Other | Admitting: Nurse Practitioner

## 2019-01-18 ENCOUNTER — Encounter: Payer: Self-pay | Admitting: Nurse Practitioner

## 2019-01-18 ENCOUNTER — Other Ambulatory Visit: Payer: Self-pay

## 2019-01-18 ENCOUNTER — Other Ambulatory Visit (INDEPENDENT_AMBULATORY_CARE_PROVIDER_SITE_OTHER): Payer: Medicaid Other

## 2019-01-18 VITALS — BP 127/70 | HR 73 | Temp 97.0°F | Ht 65.0 in | Wt 192.0 lb

## 2019-01-18 DIAGNOSIS — K921 Melena: Secondary | ICD-10-CM

## 2019-01-18 DIAGNOSIS — R110 Nausea: Secondary | ICD-10-CM

## 2019-01-18 DIAGNOSIS — Z1159 Encounter for screening for other viral diseases: Secondary | ICD-10-CM

## 2019-01-18 DIAGNOSIS — K219 Gastro-esophageal reflux disease without esophagitis: Secondary | ICD-10-CM | POA: Diagnosis not present

## 2019-01-18 DIAGNOSIS — R112 Nausea with vomiting, unspecified: Secondary | ICD-10-CM | POA: Diagnosis not present

## 2019-01-18 LAB — COMPREHENSIVE METABOLIC PANEL
ALT: 17 U/L (ref 0–35)
AST: 15 U/L (ref 0–37)
Albumin: 4.4 g/dL (ref 3.5–5.2)
Alkaline Phosphatase: 81 U/L (ref 39–117)
BUN: 14 mg/dL (ref 6–23)
CO2: 27 mEq/L (ref 19–32)
Calcium: 9.2 mg/dL (ref 8.4–10.5)
Chloride: 105 mEq/L (ref 96–112)
Creatinine, Ser: 0.68 mg/dL (ref 0.40–1.20)
GFR: 108.01 mL/min (ref >60.00)
Glucose, Bld: 113 mg/dL — ABNORMAL HIGH (ref 70–99)
Potassium: 3.7 mEq/L (ref 3.5–5.1)
Sodium: 141 mEq/L (ref 135–145)
Total Bilirubin: 0.4 mg/dL (ref 0.2–1.2)
Total Protein: 7.7 g/dL (ref 6.0–8.3)

## 2019-01-18 LAB — CBC WITH DIFFERENTIAL/PLATELET
Basophils Absolute: 0.1 10*3/uL (ref 0.0–0.1)
Basophils Relative: 1.4 % (ref 0.0–3.0)
Eosinophils Absolute: 0.3 10*3/uL (ref 0.0–0.7)
Eosinophils Relative: 6 % — ABNORMAL HIGH (ref 0.0–5.0)
HCT: 40.4 % (ref 36.0–46.0)
Hemoglobin: 13.3 g/dL (ref 12.0–15.0)
Lymphocytes Relative: 49.1 % — ABNORMAL HIGH (ref 12.0–46.0)
Lymphs Abs: 2.2 10*3/uL (ref 0.7–4.0)
MCHC: 32.8 g/dL (ref 30.0–36.0)
MCV: 83.8 fl (ref 78.0–100.0)
Monocytes Absolute: 0.4 10*3/uL (ref 0.1–1.0)
Monocytes Relative: 8.3 % (ref 3.0–12.0)
Neutro Abs: 1.5 10*3/uL (ref 1.4–7.7)
Neutrophils Relative %: 35.2 % — ABNORMAL LOW (ref 43.0–77.0)
Platelets: 274 10*3/uL (ref 150.0–400.0)
RBC: 4.82 Mil/uL (ref 3.87–5.11)
RDW: 13.3 % (ref 11.5–15.5)
WBC: 4.4 10*3/uL (ref 4.0–10.5)

## 2019-01-18 LAB — HIGH SENSITIVITY CRP: CRP, High Sensitivity: 0.64 mg/L (ref 0.000–5.000)

## 2019-01-18 LAB — LIPASE: Lipase: 16 U/L (ref 11.0–59.0)

## 2019-01-18 NOTE — Patient Instructions (Signed)
If you are age 56 or older, your body mass index should be between 23-30. Your Body mass index is 31.95 kg/m. If this is out of the aforementioned range listed, please consider follow up with your Primary Care Provider.  If you are age 70 or younger, your body mass index should be between 19-25. Your Body mass index is 31.95 kg/m. If this is out of the aformentioned range listed, please consider follow up with your Primary Care Provider.   You have been scheduled for an endoscopy and colonoscopy. Please follow the written instructions given to you at your visit today. Please pick up your prep supplies at the pharmacy within the next 1-3 days. If you use inhalers (even only as needed), please bring them with you on the day of your procedure.  Your provider has requested that you go to the basement level for lab work before leaving today. Press "B" on the elevator. The lab is located at the first door on the left as you exit the elevator.  You have been scheduled for an abdominal ultrasound at [facility] (1st floor of hospital) on 01-21-2019 at 8am. Please arrive 15 minutes prior to your appointment for registration. Make certain not to have anything to eat or drink 6 hours prior to your appointment. Should you need to reschedule your appointment, please contact radiology at [phone]. This test typically takes about 30 minutes to perform.  Continue omeprazole 20 mg every morning.   Call if symptoms worsen.   Further work up will be determined after EGD/Colon, labs and [place] are completed.   It was a pleasure to see you today!

## 2019-01-18 NOTE — Progress Notes (Signed)
Reviewed and agree with management plan.  Jhostin Epps T. Chaz Mcglasson, MD FACG [facility]  

## 2019-01-19 ENCOUNTER — Other Ambulatory Visit: Payer: Self-pay

## 2019-01-19 DIAGNOSIS — K219 Gastro-esophageal reflux disease without esophagitis: Secondary | ICD-10-CM

## 2019-01-19 DIAGNOSIS — K648 Other hemorrhoids: Secondary | ICD-10-CM

## 2019-01-19 DIAGNOSIS — K921 Melena: Secondary | ICD-10-CM

## 2019-01-19 DIAGNOSIS — R110 Nausea: Secondary | ICD-10-CM

## 2019-01-19 DIAGNOSIS — K5904 Chronic idiopathic constipation: Secondary | ICD-10-CM

## 2019-01-19 DIAGNOSIS — R112 Nausea with vomiting, unspecified: Secondary | ICD-10-CM

## 2019-01-19 MED ORDER — NA SULFATE-K SULFATE-MG SULF 17.5-3.13-1.6 GM/177ML PO SOLN: 1.0000 | Freq: Once | ORAL | 0 refills | Status: AC

## 2019-01-21 ENCOUNTER — Ambulatory Visit (HOSPITAL_COMMUNITY)
Admission: RE | Admit: 2019-01-21 | Discharge: 2019-01-21 | Disposition: A | Discharge status: Home / Self Care | Payer: Medicaid Other | Source: Ambulatory Visit | Attending: Nurse Practitioner | Admitting: Nurse Practitioner

## 2019-01-21 ENCOUNTER — Other Ambulatory Visit: Payer: Self-pay

## 2019-01-21 DIAGNOSIS — K760 Fatty (change of) liver, not elsewhere classified: Secondary | ICD-10-CM | POA: Diagnosis not present

## 2019-01-21 DIAGNOSIS — R112 Nausea with vomiting, unspecified: Secondary | ICD-10-CM | POA: Insufficient documentation

## 2019-01-21 DIAGNOSIS — K921 Melena: Secondary | ICD-10-CM | POA: Insufficient documentation

## 2019-01-24 ENCOUNTER — Telehealth: Payer: Self-pay | Admitting: Nurse Practitioner

## 2019-01-24 DIAGNOSIS — K760 Fatty (change of) liver, not elsewhere classified: Secondary | ICD-10-CM

## 2019-01-24 NOTE — Telephone Encounter (Signed)
Lab orders already in Epic; Recall placed in [place] staff message-

## 2019-01-24 NOTE — Telephone Encounter (Signed)
Bre, pls enter lab recall in 6 months, repeat hepatic panel in 6 months due to abd sono showing fatty liver. thx

## 2019-02-15 ENCOUNTER — Ambulatory Visit (INDEPENDENT_AMBULATORY_CARE_PROVIDER_SITE_OTHER): Payer: Medicaid Other

## 2019-02-15 ENCOUNTER — Other Ambulatory Visit: Payer: Self-pay | Admitting: Gastroenterology

## 2019-02-15 DIAGNOSIS — Z1159 Encounter for screening for other viral diseases: Secondary | ICD-10-CM | POA: Diagnosis not present

## 2019-02-15 LAB — SARS CORONAVIRUS 2 (TAT 6-24 HRS): SARS Coronavirus 2: NEGATIVE

## 2019-02-17 ENCOUNTER — Other Ambulatory Visit: Payer: Self-pay

## 2019-02-17 ENCOUNTER — Ambulatory Visit (AMBULATORY_SURGERY_CENTER): Payer: Medicaid Other | Admitting: Gastroenterology

## 2019-02-17 ENCOUNTER — Encounter: Payer: Self-pay | Admitting: Gastroenterology

## 2019-02-17 VITALS — BP 111/64 | HR 61 | Temp 98.6°F | Resp 18 | Ht 65.0 in | Wt 192.0 lb

## 2019-02-17 DIAGNOSIS — K449 Diaphragmatic hernia without obstruction or gangrene: Secondary | ICD-10-CM

## 2019-02-17 DIAGNOSIS — K639 Disease of intestine, unspecified: Secondary | ICD-10-CM

## 2019-02-17 DIAGNOSIS — K921 Melena: Secondary | ICD-10-CM | POA: Diagnosis not present

## 2019-02-17 DIAGNOSIS — K6289 Other specified diseases of anus and rectum: Secondary | ICD-10-CM | POA: Diagnosis not present

## 2019-02-17 DIAGNOSIS — K5289 Other specified noninfective gastroenteritis and colitis: Secondary | ICD-10-CM | POA: Diagnosis not present

## 2019-02-17 DIAGNOSIS — K640 First degree hemorrhoids: Secondary | ICD-10-CM

## 2019-02-17 DIAGNOSIS — K2970 Gastritis, unspecified, without bleeding: Secondary | ICD-10-CM

## 2019-02-17 DIAGNOSIS — K3189 Other diseases of stomach and duodenum: Secondary | ICD-10-CM | POA: Diagnosis not present

## 2019-02-17 DIAGNOSIS — R112 Nausea with vomiting, unspecified: Secondary | ICD-10-CM | POA: Diagnosis not present

## 2019-02-17 DIAGNOSIS — K2951 Unspecified chronic gastritis with bleeding: Secondary | ICD-10-CM | POA: Diagnosis not present

## 2019-02-17 DIAGNOSIS — K5010 Crohn's disease of large intestine without complications: Secondary | ICD-10-CM | POA: Diagnosis not present

## 2019-02-17 DIAGNOSIS — B9681 Helicobacter pylori [H. pylori] as the cause of diseases classified elsewhere: Secondary | ICD-10-CM | POA: Diagnosis not present

## 2019-02-17 DIAGNOSIS — K529 Noninfective gastroenteritis and colitis, unspecified: Secondary | ICD-10-CM | POA: Diagnosis not present

## 2019-02-17 MED ORDER — SODIUM CHLORIDE 0.9 % IV SOLN: 500.0000 mL | Freq: Once | INTRAVENOUS | Status: DC

## 2019-02-17 NOTE — Progress Notes (Signed)
Called to room to assist during endoscopic procedure.  Patient ID and intended procedure confirmed with present staff. Received instructions for my participation in the procedure from the performing physician.  

## 2019-02-17 NOTE — Op Note (Signed)
[place] Patient Name: Amanda Dickerson Procedure Date: 02/17/2019 10:08 AM MRN: [id number] Endoscopist: Ladene Artist , MD Age: 58 Referring MD:  Date of Birth: 1961/03/02 Gender: Female Account #: [id number] Procedure:                Colonoscopy Indications:              Hematochezia Medicines:                Monitored Anesthesia Care Procedure:                Pre-Anesthesia Assessment:                           - Prior to the procedure, a History and Physical                            was performed, and patient medications and                            allergies were reviewed. The patient's tolerance of                            previous anesthesia was also reviewed. The risks                            and benefits of the procedure and the sedation                            options and risks were discussed with the patient.                            All questions were answered, and informed consent                            was obtained. Prior Anticoagulants: The patient has                            taken no previous anticoagulant or antiplatelet                            agents. ASA Grade Assessment: II - A patient with                            mild systemic disease. After reviewing the risks                            and benefits, the patient was deemed in                            satisfactory condition to undergo the procedure.                           After obtaining informed consent, the colonoscope  was passed under direct vision. Throughout the                            procedure, the patient's blood pressure, pulse, and                            oxygen saturations were monitored continuously. The                            Colonoscope was introduced through the anus and                            advanced to the the terminal ileum, with                            identification of the appendiceal orifice and IC                         valve. The terminal ileum, ileocecal valve,                            appendiceal orifice, and rectum were photographed.                            The quality of the bowel preparation was excellent.                            The colonoscopy was performed without difficulty.                            The patient tolerated the procedure well. Scope In: 10:21:39 AM Scope Out: 10:36:42 AM Scope Withdrawal Time: 0 hours 13 minutes 8 seconds  Total Procedure Duration: 0 hours 15 minutes 3 seconds  Findings:                 The perianal and digital rectal examinations were                            normal.                           The terminal ileum appeared normal.                           Patchy mild inflammation characterized by altered                            vascularity, congestion (edema), friability and                            granularity was found in the rectum and in the                            sigmoid colon. Biopsies were taken with a cold  forceps for histology.                           Internal hemorrhoids were found during                            retroflexion. The hemorrhoids were medium-sized and                            Grade I (internal hemorrhoids that do not prolapse).                           The exam was otherwise without abnormality on                            direct and retroflexion views. Complications:            No immediate complications. Estimated blood loss:                            None. Estimated Blood Loss:     Estimated blood loss: none. Impression:               - The examined portion of the ileum was normal.                           - Patchy mild inflammation was found in the rectum                            and in the sigmoid colon secondary to colitis.                            Biopsied.                           - Internal hemorrhoids.                           - The examination  was otherwise normal on direct                            and retroflexion views. Recommendation:           - Repeat colonoscopy after studies are complete for                            surveillance based on pathology results.                           - Patient has a contact number available for                            emergencies. The signs and symptoms of potential                            delayed complications were discussed with the  patient. Return to normal activities tomorrow.                            Written discharge instructions were provided to the                            patient.                           - Resume previous diet.                           - Continue present medications.                           - Await pathology results. Ladene Artist, MD 02/17/2019 10:49:23 AM This report has been signed electronically.

## 2019-02-17 NOTE — Progress Notes (Signed)
Temp   VS  CW  Pt's states no medical or surgical changes since previsit or office visit.

## 2019-02-17 NOTE — Op Note (Signed)
[place] Patient Name: Amanda Dickerson Procedure Date: 02/17/2019 10:09 AM MRN: [id number] Endoscopist: Ladene Artist , MD Age: 58 Referring MD:  Date of Birth: April 16, 1960 Gender: Female Account #: [id number] Procedure:                Upper GI endoscopy Indications:              Nausea with vomiting Medicines:                Monitored Anesthesia Care Procedure:                Pre-Anesthesia Assessment:                           - Prior to the procedure, a History and Physical                            was performed, and patient medications and                            allergies were reviewed. The patient's tolerance of                            previous anesthesia was also reviewed. The risks                            and benefits of the procedure and the sedation                            options and risks were discussed with the patient.                            All questions were answered, and informed consent                            was obtained. Prior Anticoagulants: The patient has                            taken no previous anticoagulant or antiplatelet                            agents. ASA Grade Assessment: II - A patient with                            mild systemic disease. After reviewing the risks                            and benefits, the patient was deemed in                            satisfactory condition to undergo the procedure.                           After obtaining informed consent, the endoscope was  passed under direct vision. Throughout the                            procedure, the patient's blood pressure, pulse, and                            oxygen saturations were monitored continuously. The                            Endoscope was introduced through the mouth, and                            advanced to the second part of duodenum. The upper                            GI endoscopy was accomplished  without difficulty.                            The patient tolerated the procedure well. Scope In: Scope Out: Findings:                 The examined esophagus was normal.                           Multiple dispersed small erosions with no bleeding                            and no stigmata of recent bleeding were found in                            the gastric fundus, in the gastric body and in the                            gastric antrum. Biopsies were taken with a cold                            forceps for histology.                           A medium-sized hiatal hernia was present.                           The exam of the stomach was otherwise normal.                           The duodenal bulb and second portion of the                            duodenum were normal. Complications:            No immediate complications. Estimated Blood Loss:     Estimated blood loss was minimal. Impression:               - Normal esophagus.                           -  Erosive gastropathy with no bleeding and no                            stigmata of recent bleeding. Biopsied.                           - Medium-sized hiatal hernia.                           - Normal duodenal bulb and second portion of the                            duodenum. Recommendation:           - Patient has a contact number available for                            emergencies. The signs and symptoms of potential                            delayed complications were discussed with the                            patient. Return to normal activities tomorrow.                            Written discharge instructions were provided to the                            patient.                           - Resume previous diet.                           - Continue present medications.                           - Await pathology results.                           - Return to GI office in 2-3 weeks with me or APP. Ladene Artist,  MD 02/17/2019 10:54:38 AM This report has been signed electronically.

## 2019-02-17 NOTE — Patient Instructions (Signed)
Handouts given for Hiatal Hernia, Gastritis and Hemorrhoids.  Try using some Preparation H on your hemorrhoids for a few days.  Avoid NSAIDS (Aleve, Aspirin, Naproxen, Ibuprofen)  They can cause bleeding.  Use Tylenol if needed.  YOU HAD AN ENDOSCOPIC PROCEDURE TODAY AT [facility]:   Refer to the procedure report that was given to you for any specific questions about what was found during the examination.  If the procedure report does not answer your questions, please call your gastroenterologist to clarify.  If you requested that your care partner not be given the details of your procedure findings, then the procedure report has been included in a sealed envelope for you to review at your convenience later.  YOU SHOULD EXPECT: Some feelings of bloating in the abdomen. Passage of more gas than usual.  Walking can help get rid of the air that was put into your GI tract during the procedure and reduce the bloating. If you had a lower endoscopy (such as a colonoscopy or flexible sigmoidoscopy) you may notice spotting of blood in your stool or on the toilet paper. If you underwent a bowel prep for your procedure, you may not have a normal bowel movement for a few days.  Please Note:  You might notice some irritation and congestion in your nose or some drainage.  This is from the oxygen used during your procedure.  There is no need for concern and it should clear up in a day or so.  SYMPTOMS TO REPORT IMMEDIATELY:   Following lower endoscopy (colonoscopy or flexible sigmoidoscopy):  Excessive amounts of blood in the stool  Significant tenderness or worsening of abdominal pains  Swelling of the abdomen that is new, acute  Fever of 100F or higher   Following upper endoscopy (EGD)  Vomiting of blood or coffee ground material  New chest pain or pain under the shoulder blades  Painful or persistently difficult swallowing  New shortness of breath  Fever of 100F or higher  Black,  tarry-looking stools  For urgent or emergent issues, a gastroenterologist can be reached at any hour by calling [phone].   DIET:  We do recommend a small meal at first, but then you may proceed to your regular diet.  Drink plenty of fluids but you should avoid alcoholic beverages for 24 hours.  ACTIVITY:  You should plan to take it easy for the rest of today and you should NOT DRIVE or use heavy machinery until tomorrow (because of the sedation medicines used during the test).    FOLLOW UP: Our staff will call the number listed on your records 48-72 hours following your procedure to check on you and address any questions or concerns that you may have regarding the information given to you following your procedure. If we do not reach you, we will leave a message.  We will attempt to reach you two times.  During this call, we will ask if you have developed any symptoms of COVID 19. If you develop any symptoms (ie: fever, flu-like symptoms, shortness of breath, cough etc.) before then, please call [phone].  If you test positive for Covid 19 in the 2 weeks post procedure, please call and report this information to [place].    If any biopsies were taken you will be contacted by phone or by letter within the next 1-3 weeks.  Please call us at [phone] if you have not heard about the biopsies in 3 weeks.    SIGNATURES/CONFIDENTIALITY:  You and/or your care partner have signed paperwork which will be entered into your electronic medical record.  These signatures attest to the fact that that the information above on your After Visit Summary has been reviewed and is understood.  Full responsibility of the confidentiality of this discharge information lies with you and/or your care-partner.

## 2019-02-17 NOTE — Progress Notes (Signed)
Pt tolerated well. VSS. Awake and to recovery. 

## 2019-02-21 ENCOUNTER — Telehealth: Payer: Self-pay

## 2019-02-21 NOTE — Telephone Encounter (Signed)
  Follow up Call-  Call back number 02/17/2019  Post procedure Call Back phone  # [phone]  Permission to leave phone message Yes  Some recent data might be hidden     Patient questions:  Do you have a fever, pain , or abdominal swelling? No. Pain Score  0 *  Have you tolerated food without any problems? Yes.    Have you been able to return to your normal activities? Yes.    Do you have any questions about your discharge instructions: Diet   No. Medications  No. Follow up visit  No.  Do you have questions or concerns about your Care? No.  Actions: * If pain score is 4 or above: No action needed, pain <4. 1. Have you developed a fever since your procedure? no  2.   Have you had an respiratory symptoms (SOB or cough) since your procedure? no  3.   Have you tested positive for COVID 19 since your procedure no  4.   Have you had any family members/close contacts diagnosed with the COVID 19 since your procedure?  no   If yes to any of these questions please route to Joylene John, RN and Alphonsa Gin, Therapist, sports.

## 2019-02-23 ENCOUNTER — Other Ambulatory Visit: Payer: Self-pay

## 2019-02-23 MED ORDER — OMEPRAZOLE 20 MG PO CPDR: 20.0000 mg | DELAYED_RELEASE_CAPSULE | Freq: Two times a day (BID) | ORAL | 0 refills | Status: DC

## 2019-02-23 MED ORDER — METRONIDAZOLE 250 MG PO TABS: 250.0000 mg | ORAL_TABLET | Freq: Four times a day (QID) | ORAL | 0 refills | Status: AC

## 2019-02-23 MED ORDER — BUDESONIDE ER 9 MG PO TB24: 9.0000 mg | ORAL_TABLET | Freq: Every day | ORAL | 1 refills | Status: DC

## 2019-02-23 MED ORDER — DOXYCYCLINE HYCLATE 100 MG PO CAPS: 100.0000 mg | ORAL_CAPSULE | Freq: Two times a day (BID) | ORAL | 0 refills | Status: AC

## 2019-02-23 MED ORDER — BISMUTH SUBSALICYLATE 262 MG PO CHEW: 524.0000 mg | CHEWABLE_TABLET | Freq: Four times a day (QID) | ORAL | 0 refills | Status: AC

## 2019-02-24 ENCOUNTER — Telehealth: Payer: Self-pay

## 2019-02-24 NOTE — Telephone Encounter (Signed)
Contacted [place] [facility] at [phone] to initiate PA for Uceris ER 9 mg daily. PA is under review with confirmation #[id number]. They state the preferred medications are Apriso, Lialda, sulfasalazine, and basalazide. Informed them to proceed with PA and will await response.

## 2019-03-02 NOTE — Telephone Encounter (Signed)
Please clarify two tablets twice daily #120? Or one twice daily #60? Thank you

## 2019-03-02 NOTE — Telephone Encounter (Signed)
Lialda 1.2g, 2 po bid, #120, 5 refills

## 2019-03-02 NOTE — Telephone Encounter (Signed)
Lialda 2.4g po bid, #60, refills

## 2019-03-02 NOTE — Telephone Encounter (Signed)
Called patient to get status update on PA initiated last week. [place] [facility]. They denied Uceris stating patient has to try two of the alternatives before it can be approved. Please advise Dr. Fuller Plan what you would like patient to switch to.

## 2019-03-03 ENCOUNTER — Other Ambulatory Visit: Payer: Self-pay | Admitting: Family Medicine

## 2019-03-03 DIAGNOSIS — R232 Flushing: Secondary | ICD-10-CM

## 2019-03-03 MED ORDER — MESALAMINE 1.2 G PO TBEC: 2.4000 g | DELAYED_RELEASE_TABLET | Freq: Two times a day (BID) | ORAL | 5 refills | Status: DC

## 2019-03-03 NOTE — Telephone Encounter (Signed)
Prescription sent to patient's pharmacy and patient notified.

## 2019-03-14 DIAGNOSIS — L299 Pruritus, unspecified: Secondary | ICD-10-CM | POA: Diagnosis not present

## 2019-03-16 ENCOUNTER — Encounter: Payer: Self-pay | Admitting: Nurse Practitioner

## 2019-03-16 ENCOUNTER — Ambulatory Visit: Payer: Medicaid Other | Admitting: Nurse Practitioner

## 2019-03-16 VITALS — BP 124/70 | HR 72 | Temp 98.4°F | Ht 65.0 in | Wt 194.0 lb

## 2019-03-16 DIAGNOSIS — K6389 Other specified diseases of intestine: Secondary | ICD-10-CM | POA: Diagnosis not present

## 2019-03-16 DIAGNOSIS — A048 Other specified bacterial intestinal infections: Secondary | ICD-10-CM | POA: Insufficient documentation

## 2019-03-16 DIAGNOSIS — K50919 Crohn's disease, unspecified, with unspecified complications: Secondary | ICD-10-CM

## 2019-03-16 NOTE — Progress Notes (Signed)
03/16/2019 Amanda Dickerson [id number] 11/10/1960   History of Present Illness: Amanda Dickerson is a 59 year old female with a past medical history of anxiety, fibromyalgia, asthma, colitis and GERD.  I saw the patient in office 01/16/2019 with complaints of nausea and infrequent vomiting, loss and rectal bleeding. She underwent an EGD and colonoscopy on 02/17/2019.  The EGD identified a medium hiatal hernia and erosive gastropathy.  Biopsies were positive for Helicobacter pylori gastritis. 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  She was prescribed Bismuth subsalicylate 759 mg po qid, Metronidazole 250 mg po qid, Doxycycline 100 mg po bid,  Omeprazole  20 mg po bid x 14 days then to continue Omeprazole 83m po bid. She was prescribed Uceris 954mpo daily.  Uceris was not covered by her insurance so she was prescribed Mesalamine 1.2 gm 2 tabs p.o. twice daily on 02/24/2019.  She presents today for follow-up.  She had nausea and stomach discomfort while she took the H. pylori regimen but the symptoms have completely resolved after she completed the 14-day treatment.  She is currently taking Omeprazole 20 mg once daily.  She denies having any nausea, heartburn or stomach pain.  She is taking Mesalamine 1.2 g 2 p.o. twice daily daily without difficulty.  She denies having any lower abdominal pain.  She stated she is passing " great bowel movements" which are brown and formed once or twice daily.  No rectal bleeding or mucus per the rectum.  No melena.  Her appetite is good.  Overall, she reports feeling 100% better.  No complaints today.  EGD 02/17/2019 Dr. StFuller Plan- Normal esophagus. - Erosive gastropathy with no bleeding and no stigmata of recent bleeding. Biopsies identified chronic active gastritis with Helicobacter pylori.  No intestinal metaplasia or dysplasia. - Medium-sized hiatal  hernia. - Normal duodenal bulb and second portion of the duodenum.  Colonoscopy 02/17/2019  Dr, StFuller Plan- The examined portion of the ileum was normal. - Patchy mild inflammation was found in the rectum and in the sigmoid colon secondary to colitis.  Biopsies of the sigmoid colon identified chronic mildly active colitis with granulomas, no dysplasia.  Rectal biopsy showed chronic mildly active colitis without dysplasia. - Internal hemorrhoids. - The examination was otherwise normal on direct and retroflexion views.  Colonoscopy 10/27/2013 by Dr. StFuller Plan1. Abnormal mucosa in the sigmoid colon and rectum, mucosa had patchy erythematous and friable mucosa. Biopsies showed minimally active chronic colitis with scattered granulomatous inflammation suggestive of Crohn's disease.  No dysplasia.  2. Small internal hemorrhoids.   Colonoscopy 02/17/2006 by Dr. StFuller Planue to heme + stool and anemia:  Internal hemorrhoids otherwise normal.  EGD 02/17/2006 by Dr. StFuller Planone due to heme + stool and  anemia:  Normal.    Current Medications, Allergies, Past Medical History, Past Surgical History, Family History and Social History were reviewed in [facility].   Physical Exam: BP 124/70   Pulse 72   Temp 98.4 F (36.9 C)   Ht 5' 5"  (1.651 m)   Wt 194 lb (88 kg)   SpO2 98%   BMI 32.28 kg/m  General: Well developed  [age]ear old female in no acute distress Head: Normocephalic and atraumatic Eyes:  sclerae anicteric, conjunctiva pink  Ears: Normal auditory acuity Lungs: Clear throughout to auscultation Heart: Regular rate and rhythm, no murmurs Abdomen: Soft, non tender  and non distended. No masses, no hepatomegaly. Normal bowel sounds Rectal: Deferred.  Musculoskeletal: Symmetrical with no gross deformities  Extremities: No edema  Neurological: Alert oriented x 4, grossly nonfocal Psychological:  Alert and cooperative. Normal mood and affect  Assessment and  Recommendations:  18.  59 year old female with H. pylori gastritis and GERD.  -Continue Omeprazole 20 mg once daily.  She will eventually need to wean off the Omeprazole and complete a H. pylori breath test to verify H. pylori eradication.  Further discuss at the time of her next follow-up appointment. -Continue GERD diet  2.  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 twice daily -Patient to call our office if she develops rectal bleeding or abdominal pain -Follow-up in the office in 3 months with Dr. Fuller Plan.  Dr. Fuller Plan to verify colonoscopy call date.

## 2019-03-16 NOTE — Progress Notes (Signed)
Reviewed and agree with management plan.  [place] Boyett T. Kensli Bowley, MD FACG [facility]  

## 2019-03-16 NOTE — Patient Instructions (Addendum)
If you are age 57 or older, your body mass index should be between 23-30. Your Body mass index is 32.28 kg/m. If this is out of the aforementioned range listed, please consider follow up with your Primary Care Provider.  If you are age 30 or younger, your body mass index should be between 19-25. Your Body mass index is 32.28 kg/m. If this is out of the aformentioned range listed, please consider follow up with your Primary Care Provider.   Continue Mesalamine 1- 2 gm two tabs twice daily.  Continue Omeprazole 20 mg once daily.  Follow up with Dr. Fuller Plan in three months.  Please call the office for an appointment in April as the schedule is not available at this time.  Call our office if your nausea, vomiting, abdominal pain or rectal bleeding recurs.   Thank you for choosing me and [facility].

## 2019-03-29 ENCOUNTER — Telehealth: Payer: Self-pay | Admitting: Gastroenterology

## 2019-04-01 NOTE — Telephone Encounter (Signed)
Please review previous message and advise

## 2019-04-01 NOTE — Telephone Encounter (Signed)
Pt called regarding this msg. Please advise

## 2019-04-03 NOTE — Telephone Encounter (Signed)
I spoke to patient, she will continue Lialda 1.2 gm 2 po bid without any change. She feels well on this regimen. We discussed taking a probiotic once daily if she prefers, no prebiotic . She will follow up in the office with Dr Fuller Plan in a few months.

## 2019-05-25 NOTE — Progress Notes (Signed)
Subjective:   Patient ID: Amanda Dickerson    DOB: 1960/11/21, 59 y.o. female   MRN: [id number]  Amanda Dickerson is a 59 y.o. female with a history of mild intermittent asthma, GERD on PPI, recent treatment for H. 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  Vaginal Nodule Patient noticed a nodule in her vaginal area x 3 days. Denies any pain, notes she just noticed it one day. She has been treating it with warm compresses and she thinks it may be coming to a head. Not sexually active for about 1 year.   Irregular Heart Rate: Patient notes she started feeling irregular heart beat since last thursday. She notes it feels like it 'jumps" a little bit. The "jumping sensation" occurs over and over again. She notes it doesn't go away until she goes to sleep. Denies any chest pain but does feel more SOB since right before this occurred. She notes she doesn't exercise but when she walks more she feels more SOB. The palpitations do not wake her up during the night. Denies any other symptoms like vision changes, headaches. She notes she recently started Mesalamine in January for her colitis. Denies feeling like her heart is beating really fast.  Seldomly drinks alcohol. Never smoker. Denies daily caffeine use - she drinks caffeine free coffee and tea.   Health Maintenance: Due for TDAP and flu vaccine  Review of Systems:  Per HPI.   Objective:   BP 128/60   Pulse 61   Wt 200 lb (90.7 kg)   SpO2 99%   BMI 33.28 kg/m  Vitals and nursing note reviewed.  General: Pleasant older female, sitting comfortably in exam chair, well nourished, well developed, in no acute distress with non-toxic appearance HEENT: normocephalic, atraumatic Neck: supple, no thyromegaly CV: regular rate, regular rhythm with periodic extra beats appropriated on auscultation, no murmurs or gallops, no lower extremity  edema, 2+ radial and pedal pulses bilaterally, no carotid bruits appreciated Lungs: clear to auscultation bilaterally with normal work of breathing Skin: warm, dry Extremities: warm and well perfused MSK:  gait normal Neuro: Alert and oriented, speech normal Pelvic exam: VULVA: normal appearing vulva with no masses, tenderness. Small lesion (0.2x0.2cm) with pin point pustule located at inferior to labia majora on the right, nontender to palpation, no erythema, edema, or warmth associated.   Prior Imaging/Labs:  EKG 07/2016: sinus bradycardia, otherwise normal.  Myoview Exercise Stress Test: 2013  The left ventricular ejection fraction is hyperdynamic (>65%).  Nuclear stress EF: 71%.  There was no ST segment deviation noted during stress.  The study is normal.  This is a low risk study.  Assessment & Plan:   Irregular heart beat Patient presenting today with new onset palpitations and shortness of breath without chest pain.  Several extra beats appreciated on cardiac auscultation during physical exam. No prior cardiovascular history, but has recently been diagnosed with possible Crohn's disease and started on Mesalamine. Upon research of this medication, there is <1% incidence of palpitations with the medication.  EKG obtained and notable for sinus rhythm with premature atrial complexes, heart rate 61 bpm otherwise normal without signs of ischemia.  Patient has a history of normal exercise stress test back in 2013. CMP with normal electrolytes,  CBC with hemoglobin 13.2, THS level WNL. Will refer patient for holter monitor to further evaluate burden of PACs as heavy burden could contribute to her symptoms and greater  than 10% daily burden long term could lead to cardiomyopathy.  We will also obtain echocardiogram as well.  We will follow up based on these results. Although unlikely, recommend patient follow up with GI to further discuss possibility of palpitations secondary to  Mesalamine.  Genital lesion, female Appears most consistent with folliculitis vs pseudofolliculitis barbae from shaving the pubic area. No signs of acute bacterial infection.  Has been improving in size with warm compresses.  Recommended to continue to monitor and applying warm compresses.  RTC if worsening or no improvement in 1 to 2 weeks.  Gastroesophageal reflux disease Well controlled with Omeprazole 50m QD. Refill provided today.  Plan for annual exam in August 2021. Will obtain A1C, lipid panel and discuss health maintenance at that time.  Orders Placed This Encounter  Procedures  . Comprehensive metabolic panel  . TSH  . CBC  . Ambulatory referral to Cardiology    Referral Priority:   Urgent    Referral Type:   Consultation    Referral Reason:   Specialty Services Required    Requested Specialty:   Cardiology    Number of Visits Requested:   1  . EKG 12-Lead  . ECHOCARDIOGRAM COMPLETE    Standing Status:   Future    Standing Expiration Date:   08/29/2020    Order Specific Question:   Where should this test be performed    [facility]    Order Specific Question:   Does the patient weigh less than or greater than 250 lbs?    Answer:   Patient weighs less than 250 lbs    Order Specific Question:   Perflutren DEFINITY (image enhancing agent) should be administered unless hypersensitivity or allergy exist    Answer:   Administer Perflutren    Order Specific Question:   Reason for exam-Echo    Answer:   Palpitations  785.1 / R00.2   Meds ordered this encounter  Medications  . omeprazole (PRILOSEC) 20 MG capsule    Sig: TAKE 1 CAPSULE BY MOUTH EVERY DAY    Dispense:  30 capsule    Refill:  [facility] DO PGY-2, [facility] 05/30/2019 11:35 AM

## 2019-05-27 ENCOUNTER — Encounter: Payer: Self-pay | Admitting: Family Medicine

## 2019-05-27 ENCOUNTER — Ambulatory Visit: Payer: Medicaid Other | Admitting: Family Medicine

## 2019-05-27 ENCOUNTER — Other Ambulatory Visit: Payer: Self-pay

## 2019-05-27 ENCOUNTER — Ambulatory Visit (HOSPITAL_COMMUNITY)
Admission: RE | Admit: 2019-05-27 | Discharge: 2019-05-27 | Disposition: A | Discharge status: Home / Self Care | Payer: Medicaid Other | Source: Ambulatory Visit | Attending: Family Medicine | Admitting: Family Medicine

## 2019-05-27 VITALS — BP 128/60 | HR 61 | Wt 200.0 lb

## 2019-05-27 DIAGNOSIS — N949 Unspecified condition associated with female genital organs and menstrual cycle: Secondary | ICD-10-CM

## 2019-05-27 DIAGNOSIS — I499 Cardiac arrhythmia, unspecified: Secondary | ICD-10-CM | POA: Diagnosis not present

## 2019-05-27 DIAGNOSIS — K219 Gastro-esophageal reflux disease without esophagitis: Secondary | ICD-10-CM

## 2019-05-27 DIAGNOSIS — I491 Atrial premature depolarization: Secondary | ICD-10-CM

## 2019-05-27 NOTE — Patient Instructions (Signed)
Thank you for coming to see me today. It was a pleasure to see you.   I have obtained blood work today. I will call you with the results and the next steps. If your symptoms worsen, please return here or go to the emergency department for further evaluation.   Try to avoid caffeine, alcohol, and smoking, over the counter cold medicines and diet pills.  Use warm compresses for the nodule.  If you have any questions or concerns, please do not hesitate to call the office at [phone].  Take Care,   Dr. Mina Marble, DO Resident Physician [place] [phone]

## 2019-05-28 LAB — COMPREHENSIVE METABOLIC PANEL
ALT: 34 IU/L — ABNORMAL HIGH (ref 0–32)
AST: 23 IU/L (ref 0–40)
Albumin/Globulin Ratio: 1.7 (ref 1.2–2.2)
Albumin: 4.5 g/dL (ref 3.8–4.9)
Alkaline Phosphatase: 98 IU/L (ref 39–117)
BUN/Creatinine Ratio: 16 (ref 9–23)
BUN: 11 mg/dL (ref 6–24)
Bilirubin Total: 0.4 mg/dL (ref 0.0–1.2)
CO2: 23 mmol/L (ref 20–29)
Calcium: 9.3 mg/dL (ref 8.7–10.2)
Chloride: 102 mmol/L (ref 96–106)
Creatinine, Ser: 0.7 mg/dL (ref 0.57–1.00)
GFR calc Af Amer: 111 mL/min/{1.73_m2} (ref >59)
GFR calc non Af Amer: 96 mL/min/{1.73_m2} (ref >59)
Globulin, Total: 2.7 g/dL (ref 1.5–4.5)
Glucose: 115 mg/dL — ABNORMAL HIGH (ref 65–99)
Potassium: 4.2 mmol/L (ref 3.5–5.2)
Sodium: 141 mmol/L (ref 134–144)
Total Protein: 7.2 g/dL (ref 6.0–8.5)

## 2019-05-28 LAB — CBC
Hematocrit: 40.5 % (ref 34.0–46.6)
Hemoglobin: 13.2 g/dL (ref 11.1–15.9)
MCH: 27.2 pg (ref 26.6–33.0)
MCHC: 32.6 g/dL (ref 31.5–35.7)
MCV: 83 fL (ref 79–97)
Platelets: 256 10*3/uL (ref 150–450)
RBC: 4.86 x10E6/uL (ref 3.77–5.28)
RDW: 13.3 % (ref 11.7–15.4)
WBC: 4.1 10*3/uL (ref 3.4–10.8)

## 2019-05-28 LAB — TSH: TSH: 1.55 u[IU]/mL (ref 0.450–4.500)

## 2019-05-30 ENCOUNTER — Telehealth: Payer: Self-pay

## 2019-05-30 DIAGNOSIS — N949 Unspecified condition associated with female genital organs and menstrual cycle: Secondary | ICD-10-CM | POA: Insufficient documentation

## 2019-05-30 DIAGNOSIS — I499 Cardiac arrhythmia, unspecified: Secondary | ICD-10-CM | POA: Insufficient documentation

## 2019-05-30 MED ORDER — OMEPRAZOLE 20 MG PO CPDR: DELAYED_RELEASE_CAPSULE | ORAL | 3 refills | Status: DC

## 2019-05-30 NOTE — Assessment & Plan Note (Addendum)
Patient presenting today with new onset palpitations and shortness of breath without chest pain.  Several extra beats appreciated on cardiac auscultation during physical exam. No prior cardiovascular history, but has recently been diagnosed with possible Crohn's disease and started on Mesalamine. Upon research of this medication, there is <1% incidence of palpitations with the medication.  EKG obtained and notable for sinus rhythm with premature atrial complexes, heart rate 61 bpm otherwise normal without signs of ischemia.  Patient has a history of normal exercise stress test back in 2013. CMP with normal electrolytes,  CBC with hemoglobin 13.2, THS level WNL. Will refer patient for holter monitor to further evaluate burden of PACs as heavy burden could contribute to her symptoms and greater than 10% daily burden long term could lead to cardiomyopathy.  We will also obtain echocardiogram as well.  We will follow up based on these results. Although unlikely, recommend patient follow up with GI to further discuss possibility of palpitations secondary to Mesalamine.

## 2019-05-30 NOTE — Assessment & Plan Note (Signed)
Appears most consistent with folliculitis vs pseudofolliculitis barbae from shaving the pubic area. No signs of acute bacterial infection.  Has been improving in size with warm compresses.  Recommended to continue to monitor and applying warm compresses.  RTC if worsening or no improvement in 1 to 2 weeks.

## 2019-05-30 NOTE — Telephone Encounter (Signed)
Made appointment for echo.   Patient  informed.   05/31/2019  1500 hrs Arrival 1445 through entrance C [phone]  Patient verbalized understanding.  Amanda Dickerson, [place]

## 2019-05-30 NOTE — Assessment & Plan Note (Signed)
Well controlled with Omeprazole 21m QD. Refill provided today.

## 2019-05-31 ENCOUNTER — Ambulatory Visit (HOSPITAL_COMMUNITY)
Admission: RE | Admit: 2019-05-31 | Discharge: 2019-05-31 | Disposition: A | Discharge status: Home / Self Care | Payer: Medicaid Other | Source: Ambulatory Visit | Attending: Family Medicine | Admitting: Family Medicine

## 2019-05-31 ENCOUNTER — Other Ambulatory Visit: Payer: Self-pay

## 2019-05-31 DIAGNOSIS — R9431 Abnormal electrocardiogram [ECG] [EKG]: Secondary | ICD-10-CM | POA: Diagnosis not present

## 2019-05-31 DIAGNOSIS — I499 Cardiac arrhythmia, unspecified: Secondary | ICD-10-CM | POA: Diagnosis not present

## 2019-05-31 DIAGNOSIS — I491 Atrial premature depolarization: Secondary | ICD-10-CM

## 2019-05-31 NOTE — Progress Notes (Signed)
  Echocardiogram 2D Echocardiogram has been performed.  Amanda Dickerson M 05/31/2019, 3:07 PM

## 2019-06-13 NOTE — Progress Notes (Signed)
Cardiology Office Note:    Date:  06/15/2019   ID:  Amanda Dickerson, DOB 1960-05-19, MRN [id number]  PCP:  Danna Hefty, DO  Cardiologist:  No primary care provider on file.  Electrophysiologist:  None   Referring MD: Lind Covert, *   Chief Complaint  Patient presents with  . Palpitations    History of Present Illness:    Amanda Dickerson is a 59 y.o. 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 Erin Hearing for evaluation of irregular heartbeat.  She reports that she started having palpitations in the beginning of March.  Happens every day, can go on for hours.  Does not feel her heart is racing, but feels like heart beat is irregular.  Reports not having any palpitations this morning but had all day yesterday.  No smoking history.  Does not drink coffee, but will have occasional caffeinated soda.  Rare alcohol use.  Family history includes mother has atrial fibrillation and congestive heart failure.  Exercise Myoview 09/2014 showed no ischemia or infarct, EF 71%.  TTE 05/31/2019 showed normal biventricular function without evidence of significant valvular disease.  Labs on 05/27/2019 showed normal potassium, normal TSH.   Past Medical History:  Diagnosis Date  . Anemia   . Anxiety   . Arthritis    KNEES  . Asthma   . Colitis   . Dysfunctional uterine bleeding   . Fibromyalgia   . GERD (gastroesophageal reflux disease)   . Headache(784.0)   . History of cardiovascular stress test    ETT-Myoview 7/16:  EF 71%, no ischemia or infarct; Low Risk    Past Surgical History:  Procedure Laterality Date  . CESAREAN SECTION  1988  . COLONOSCOPY    . ENDOMETRIAL ABLATION W/ NOVASURE  08/2011  . MULTIPLE TOOTH EXTRACTIONS    . TUBAL LIGATION  2000    Current Medications: Current Meds  Medication Sig  . albuterol (PROAIR HFA) 108 (90 Base) MCG/ACT inhaler INHALE 2 PUFFS  EVERY 6 HOURS AS NEEDED FOR WHEEZE/SHORTNESS OF BREATH  . mesalamine (LIALDA) 1.2 g EC tablet Take 2 tablets (2.4 g total) by mouth 2 (two) times daily.  Marland Kitchen omeprazole (PRILOSEC) 20 MG capsule TAKE 1 CAPSULE BY MOUTH EVERY DAY  . polyethylene glycol powder (GLYCOLAX/MIRALAX) powder Mix 17 grams in 8 oz of water 1-2 x daily  . venlafaxine (EFFEXOR) 75 MG tablet TAKE 1 TABLET BY MOUTH TWICE A DAY     Allergies:   Ibuprofen and Penicillins   Social History   Socioeconomic History  . Marital status: Legally Separated    Spouse name: Not on file  . Number of children: 1  . Years of education: 59  . Highest education level: Not on file  Occupational History  . Occupation: Disabled  Tobacco Use  . Smoking status: Never Smoker  . Smokeless tobacco: Never Used  Substance and Sexual Activity  . Alcohol use: Yes    Comment: occasionally  . Drug use: No  . Sexual activity: Never    Birth control/protection: Surgical  Other Topics Concern  . Not on file  Social History Narrative   Lives alone   Caffeine - rarely   Social Determinants of Health   Financial Resource Strain:   . Difficulty of Paying Living Expenses:   Food Insecurity:   . Worried About Charity fundraiser in the Last Year:   . [employer]  in the Last Year:   Transportation Needs:   . Film/video editor (Medical):   Marland Kitchen Lack of Transportation (Non-Medical):   Physical Activity:   . Days of Exercise per Week:   . Minutes of Exercise per Session:   Stress:   . Feeling of Stress :   Social Connections:   . Frequency of Communication with Friends and Family:   . Frequency of Social Gatherings with Friends and Family:   . Attends Religious Services:   . Active Member of Clubs or Organizations:   . Attends Archivist Meetings:   Marland Kitchen Marital Status:      Family History: The patient's family history includes Colon polyps in her mother and sister; Diabetes in her father; Heart disease in her mother;  Hyperlipidemia in her mother; Hypertension in her mother; Other in her mother; Prostate cancer in her father. There is no history of Esophageal cancer, Colon cancer, Stomach cancer, or Rectal cancer.  ROS:   Please see the history of present illness.     All other systems reviewed and are negative.  EKGs/Labs/Other Studies Reviewed:    The following studies were reviewed today:   EKG:  EKG is  ordered today.  The ekg ordered today demonstrates normal sinus rhythm, rate 56, no ST/T abnormalities  TTE 05/31/19: 1. Left ventricular ejection fraction, by estimation, is 65 to 70%. The  left ventricle has normal function. The left ventricle has no regional  wall motion abnormalities. Left ventricular diastolic parameters were  normal.  2. Right ventricular systolic function is normal. The right ventricular  size is normal. There is mildly elevated pulmonary artery systolic  pressure.  3. The mitral valve is normal in structure. Trivial mitral valve  regurgitation. No evidence of mitral stenosis.  4. The aortic valve is tricuspid. Aortic valve regurgitation is not  visualized. No aortic stenosis is present.   Conclusion(s)/Recommendation(s): Normal biventricular function without  evidence of hemodynamically significant valvular heart disease.   Lexiscan Myoview 09/21/2014:  The left ventricular ejection fraction is hyperdynamic (>65%).  Nuclear stress EF: 71%.  There was no ST segment deviation noted during stress.  The study is normal.  This is a low risk study.   Normal study with no evidence of infarct or ischemia.     Recent Labs: 05/27/2019: ALT 34; BUN 11; Creatinine, Ser 0.70; Hemoglobin 13.2; Platelets 256; Potassium 4.2; Sodium 141; TSH 1.550  Recent Lipid Panel    Component Value Date/Time   CHOL 195 10/20/2018 1013   TRIG 131 10/20/2018 1013   HDL 71 10/20/2018 1013   CHOLHDL 2.7 10/20/2018 1013   CHOLHDL 3.4 07/26/2015 0918   VLDL 28 07/26/2015 0918    LDLCALC 98 10/20/2018 1013    Physical Exam:    VS:  BP 120/78   Pulse (!) 56   Temp (!) 96.9 F (36.1 C)   Ht 5' 5"  (1.651 m)   Wt 197 lb 6.4 oz (89.5 kg)   SpO2 98%   BMI 32.85 kg/m     Wt Readings from Last 3 Encounters:  06/15/19 197 lb 6.4 oz (89.5 kg)  05/27/19 200 lb (90.7 kg)  03/16/19 194 lb (88 kg)     GEN:  Well nourished, well developed in no acute distress HEENT: Normal NECK: No JVD; No carotid bruits LYMPHATICS: No lymphadenopathy CARDIAC: irregular, normal rate, no murmurs, rubs, gallops RESPIRATORY:  Clear to auscultation without rales, wheezing or rhonchi  ABDOMEN: Soft, non-tender, non-distended MUSCULOSKELETAL:  No edema; No  deformity  SKIN: Warm and dry NEUROLOGIC:  Alert and oriented x 3 PSYCHIATRIC:  Normal affect   ASSESSMENT:    1. Palpitations   2. Prediabetes    PLAN:    In order of problems listed above:  Palpitations: Description concerning for arrhythmia, likely PACs/PVCs.  Normal thyroid function.  TTE on 06/01/2019 showed normal systolic function.  Will check Zio patch x3 days  Prediabetes: A1c 6.1  RTC in 2 months   Medication Adjustments/Labs and Tests Ordered: Current medicines are reviewed at length with the patient today.  Concerns regarding medicines are outlined above.  Orders Placed This Encounter  Procedures  . LONG TERM MONITOR (3-14 DAYS)  . EKG 12-Lead   No orders of the defined types were placed in this encounter.   Patient Instructions  Medication Instructions:  Your physician recommends that you continue on your current medications as directed. Please refer to the Current Medication list given to you today.  Testing/Procedures:  Bryn Gulling- Long Term Monitor Instructions   Your physician has requested you wear your ZIO patch monitor 3 days.   This is a single patch monitor.  Irhythm supplies one patch monitor per enrollment.  Additional stickers are not available.   Please do not apply patch if you will be  having a Nuclear Stress Test, Echocardiogram, Cardiac CT, MRI, or Chest Xray during the time frame you would be wearing the monitor. The patch cannot be worn during these tests.  You cannot remove and re-apply the ZIO XT patch monitor.   Your ZIO patch monitor will be sent USPS Priority mail from [facility] directly to your home address. The monitor may also be mailed to a PO BOX if home delivery is not available.   It may take 3-5 days to receive your monitor after you have been enrolled.   Once you have received you monitor, please review enclosed instructions.  Your monitor has already been registered assigning a specific monitor serial # to you.   Applying the monitor   Shave hair from upper left chest.   Hold abrader disc by orange tab.  Rub abrader in 40 strokes over left upper chest as indicated in your monitor instructions.   Clean area with 4 enclosed alcohol pads .  Use all pads to assure are is cleaned thoroughly.  Let dry.   Apply patch as indicated in monitor instructions.  Patch will be place under collarbone on left side of chest with arrow pointing upward.   Rub patch adhesive wings for 2 minutes.Remove white label marked "1".  Remove white label marked "2".  Rub patch adhesive wings for 2 additional minutes.   While looking in a mirror, press and release button in center of patch.  A small green light will flash 3-4 times .  This will be your only indicator the monitor has been turned on.     Do not shower for the first 24 hours.  You may shower after the first 24 hours.   Press button if you feel a symptom. You will hear a small click.  Record Date, Time and Symptom in the Patient Log Book.   When you are ready to remove patch, follow instructions on last 2 pages of Patient Log Book.  Stick patch monitor onto last page of Patient Log Book.   Place Patient Log Book in [place] box.  Use locking tab on box and tape box closed securely.  The [place] and [employer] has  [employer] on  it.  Please place in mailbox as soon as possible.  Your physician should have your test results approximately 7 days after the monitor has been mailed back to [facility].   Call [place] at [phone] if you have questions regarding your ZIO XT patch monitor.  Call them immediately if you see an orange light blinking on your monitor.   If your monitor falls off in less than 4 days contact our Monitor department at [phone].  If your monitor becomes loose or falls off after 4 days call [employer] at [phone] for suggestions on securing your monitor.    Follow-Up: At [facility], you and your health needs are our priority.  As part of our continuing mission to provide you with exceptional heart care, we have created designated Provider Care Teams.  These Care Teams include your primary Cardiologist (physician) and Advanced Practice Providers (APPs -  Physician Assistants and Nurse Practitioners) who all work together to provide you with the care you need, when you need it.  We recommend signing up for the patient portal called "MyChart".  Sign up information is provided on this After Visit Summary.  MyChart is used to connect with patients for Virtual Visits (Telemedicine).  Patients are able to view lab/test results, encounter notes, upcoming appointments, etc.  Non-urgent messages can be sent to your provider as well.   To learn more about what you can do with MyChart, go to [email or web address].    Your next appointment:   2 month(s)  The format for your next appointment:   Either In Person or Virtual  Provider:   Oswaldo Milian, MD       Signed, Donato Heinz, MD  06/15/2019 5:55 PM    [place]

## 2019-06-14 ENCOUNTER — Other Ambulatory Visit: Payer: Self-pay | Admitting: Family Medicine

## 2019-06-14 DIAGNOSIS — J4520 Mild intermittent asthma, uncomplicated: Secondary | ICD-10-CM

## 2019-06-14 DIAGNOSIS — R232 Flushing: Secondary | ICD-10-CM

## 2019-06-15 ENCOUNTER — Encounter: Payer: Self-pay | Admitting: Cardiology

## 2019-06-15 ENCOUNTER — Encounter: Payer: Self-pay | Admitting: *Deleted

## 2019-06-15 ENCOUNTER — Ambulatory Visit: Payer: Medicaid Other | Admitting: Cardiology

## 2019-06-15 ENCOUNTER — Other Ambulatory Visit: Payer: Self-pay

## 2019-06-15 VITALS — BP 120/78 | HR 56 | Temp 96.9°F | Ht 65.0 in | Wt 197.4 lb

## 2019-06-15 DIAGNOSIS — R002 Palpitations: Secondary | ICD-10-CM

## 2019-06-15 DIAGNOSIS — R7303 Prediabetes: Secondary | ICD-10-CM

## 2019-06-15 NOTE — Patient Instructions (Signed)
Medication Instructions:  Your physician recommends that you continue on your current medications as directed. Please refer to the Current Medication list given to you today.  Testing/Procedures:  Bryn Gulling- Long Term Monitor Instructions   Your physician has requested you wear your ZIO patch monitor 3 days.   This is a single patch monitor.  Irhythm supplies one patch monitor per enrollment.  Additional stickers are not available.   Please do not apply patch if you will be having a Nuclear Stress Test, Echocardiogram, Cardiac CT, MRI, or Chest Xray during the time frame you would be wearing the monitor. The patch cannot be worn during these tests.  You cannot remove and re-apply the ZIO XT patch monitor.   Your ZIO patch monitor will be sent USPS Priority mail from [facility] directly to your home address. The monitor may also be mailed to a PO BOX if home delivery is not available.   It may take 3-5 days to receive your monitor after you have been enrolled.   Once you have received you monitor, please review enclosed instructions.  Your monitor has already been registered assigning a specific monitor serial # to you.   Applying the monitor   Shave hair from upper left chest.   Hold abrader disc by orange tab.  Rub abrader in 40 strokes over left upper chest as indicated in your monitor instructions.   Clean area with 4 enclosed alcohol pads .  Use all pads to assure are is cleaned thoroughly.  Let dry.   Apply patch as indicated in monitor instructions.  Patch will be place under collarbone on left side of chest with arrow pointing upward.   Rub patch adhesive wings for 2 minutes.Remove white label marked "1".  Remove white label marked "2".  Rub patch adhesive wings for 2 additional minutes.   While looking in a mirror, press and release button in center of patch.  A small green light will flash 3-4 times .  This will be your only indicator the monitor has been turned on.      Do not shower for the first 24 hours.  You may shower after the first 24 hours.   Press button if you feel a symptom. You will hear a small click.  Record Date, Time and Symptom in the Patient Log Book.   When you are ready to remove patch, follow instructions on last 2 pages of Patient Log Book.  Stick patch monitor onto last page of Patient Log Book.   Place Patient Log Book in [place] box.  Use locking tab on box and tape box closed securely.  The [place] and [employer] has [employer] on it.  Please place in mailbox as soon as possible.  Your physician should have your test results approximately 7 days after the monitor has been mailed back to [facility]   Call [place] at [phone] if you have questions regarding your ZIO XT patch monitor.  Call them immediately if you see an orange light blinking on your monitor.   If your monitor falls off in less than 4 days contact our Monitor department at [phone].  If your monitor becomes loose or falls off after 4 days call [employer] at [phone] for suggestions on securing your monitor.    Follow-Up: At [facility], you and your health needs are our priority.  As part of our continuing mission to provide you with exceptional heart care, we have created designated Provider Care Teams.  These Care Teams include your primary Cardiologist (physician) and Advanced Practice Providers (APPs -  Physician Assistants and Nurse Practitioners) who all work together to provide you with the care you need, when you need it.  We recommend signing up for the patient portal called "MyChart".  Sign up information is provided on this After Visit Summary.  MyChart is used to connect with patients for Virtual Visits (Telemedicine).  Patients are able to view lab/test results, encounter notes, upcoming appointments, etc.  Non-urgent messages can be sent to your provider as well.   To learn more about what you can do with MyChart, go to  [email or web address].    Your next appointment:   2 month(s)  The format for your next appointment:   Either In Person or Virtual  Provider:   Oswaldo Milian, MD

## 2019-06-15 NOTE — Progress Notes (Signed)
Patient ID: Amanda Dickerson, female   DOB: 1961-01-05, 59 y.o.   MRN: [id number] Patient enrolled for 3 day ZIO XT long term holter monitor to be sent to her home by [employer].

## 2019-06-17 ENCOUNTER — Ambulatory Visit (INDEPENDENT_AMBULATORY_CARE_PROVIDER_SITE_OTHER): Payer: Medicaid Other

## 2019-06-17 DIAGNOSIS — R002 Palpitations: Secondary | ICD-10-CM

## 2019-06-17 NOTE — Telephone Encounter (Signed)
Patient calling to check the status.

## 2019-06-18 NOTE — Progress Notes (Signed)
06/18/2019 ZIYAN HILLMER [id number] February 23, 1961   Chief Complaint:  Follow up H. Pylori, colitis   History of Present Illness:  Amanda Dickerson. [place]f is a 59 year old female with a past medical history of anxiety, fibromyalgia, asthma, colitis and GERD.  I saw the patient in office 01/16/2019 with complaints of nausea and infrequent vomiting and rectal bleeding. She underwent an EGD and colonoscopy on 02/17/2019.  The EGD identified a medium hiatal hernia and erosive gastropathy.  Biopsies were positive for Helicobacter pylori gastritis. 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  She was prescribed Bismuth subsalicylate 151 mg po qid, Metronidazole 250 mg po qid, Doxycycline 100 mg po bid,  Omeprazole  20 mg po bid x 14 days then to continue Omeprazole 88m po bid. I saw her again for follow up on 03/16/2019 and her symptoms were improving and Omeprazole was reduced to QD.  She remains on Omeprazole 24mdaily and Mesalamine 1.2 gm two tabs po bid. She denies having any N/V. No upper or lower abdominal pain. She is passing a normal brown BM every day, sometimes every 2 days. No rectal bleeding or mucous per the rectum. She stated " I am proud of myself" because she tolerated the prescribed H. Pylori and proctosigmoiditis treatment and she feels better now than she has in a long time. However, she developed palpitation in March 2021. No associated increased stressors or caffeine over use. She was evaluated by cardiologist Dr. ScGardiner Rhymeho assessed she most likely is having PACs/PVCs,  No chest pain or SOB. She underwent an ECHO 06/01/2019 which was normal. She was placed on a Zio patch on Friday 4/16 which stops today.    Current Medications, Allergies, Past Medical History, Past Surgical History, Family History and Social History were reviewed in [employer].   Physical Exam: BP 114/60   Pulse 68   Temp (!) 97.2 F (36.2 C)   Ht 5' 5"  (1.651 m)   Wt 201 lb 2 oz (91.2 kg)   BMI 33.47 kg/m   General: Well developed, [age]ear old female in no acute distress. Head: Normocephalic and atraumatic. Eyes: No scleral icterus. Conjunctiva pink . Lungs: Clear throughout to auscultation. Heart: Regular rate and rhythm, no murmur. Abdomen: Soft, nontender and nondistended. No masses or hepatomegaly. Normal bowel sounds x 4 quadrants.  Rectal: Deferred. Musculoskeletal: Symmetrical with no gross deformities. Extremities: No edema. Neurological: Alert oriented x 4. No focal deficits.  Psychological: Alert and cooperative. Normal mood and affect  Assessment and Recommendations:  1.87 89ear old female with a history of GERD and H. Pylori identified per EGD 12/ 17/2020. H. Pylori treated due for post treatment H. Pylori stool antigen testing to check for H. Pylori eradication.  -Hold Omeprazole 2020maily for 2 weeks then complete H. Pylori stool antigen test.  -She will monitor palpitations while off Omeprazole, if her palpitations completely resolve off Omeprazole then I would switch PPI or try Pepcid if needed.   2.  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 twice daily -Patient to call our office if she develops rectal bleeding or abdominal pain -Follow-up in the office in 4 months with Dr. StaFuller PlanDr. StaFuller Plan verify colonoscopy recall date.  3. Palpitations, new onset since March 2021 followed by cardiology. ECHO 3/30 was normal. Zio monitor results pending.

## 2019-06-20 ENCOUNTER — Encounter: Payer: Self-pay | Admitting: Nurse Practitioner

## 2019-06-20 ENCOUNTER — Other Ambulatory Visit: Payer: Medicaid Other

## 2019-06-20 ENCOUNTER — Ambulatory Visit: Payer: Medicaid Other | Admitting: Nurse Practitioner

## 2019-06-20 VITALS — BP 114/60 | HR 68 | Temp 97.2°F | Ht 65.0 in | Wt 201.1 lb

## 2019-06-20 DIAGNOSIS — A048 Other specified bacterial intestinal infections: Secondary | ICD-10-CM | POA: Diagnosis not present

## 2019-06-20 DIAGNOSIS — K6389 Other specified diseases of intestine: Secondary | ICD-10-CM | POA: Diagnosis not present

## 2019-06-20 NOTE — Progress Notes (Signed)
Reviewed and agree with management plan. Colonoscopy in December 2025.  Pricilla Riffle. Fuller Plan, MD [facility]

## 2019-06-20 NOTE — Patient Instructions (Addendum)
If you are age 57 or older, your body mass index should be between 23-30. Your Body mass index is 33.47 kg/m. If this is out of the aforementioned range listed, please consider follow up with your Primary Care Provider.  If you are age 41 or younger, your body mass index should be between 19-25. Your Body mass index is 33.47 kg/m. If this is out of the aformentioned range listed, please consider follow up with your Primary Care Provider.   Continue with Mesalamine 1.2 gm 2tabs by mouth twice daily.  Please discontinue your opemprazole for 2 weeks prior to your H. Pylori stool antigen test. Once the test is complete you may go back on omeprazole if needed. Should palpitation reoccur stop the Omeprazole immediately.   Follow up with Dr Fuller Plan in 4 months.  Due to recent changes in healthcare laws, you may see the results of your imaging and laboratory studies on MyChart before your provider has had a chance to review them.  We understand that in some cases there may be results that are confusing or concerning to you. Not all laboratory results come back in the same time frame and the provider may be waiting for multiple results in order to interpret others.  Please give [place] 48 hours in order for your provider to thoroughly review all the results before contacting the office for clarification of your results.

## 2019-07-05 ENCOUNTER — Other Ambulatory Visit: Payer: Medicaid Other

## 2019-07-05 DIAGNOSIS — A048 Other specified bacterial intestinal infections: Secondary | ICD-10-CM

## 2019-07-05 DIAGNOSIS — K6389 Other specified diseases of intestine: Secondary | ICD-10-CM | POA: Diagnosis not present

## 2019-07-06 LAB — HELICOBACTER PYLORI  SPECIAL ANTIGEN
MICRO NUMBER:: 10436984
SPECIMEN QUALITY: ADEQUATE

## 2019-07-19 ENCOUNTER — Ambulatory Visit (INDEPENDENT_AMBULATORY_CARE_PROVIDER_SITE_OTHER): Payer: Medicaid Other | Admitting: Family Medicine

## 2019-07-19 ENCOUNTER — Encounter: Payer: Self-pay | Admitting: Family Medicine

## 2019-07-19 ENCOUNTER — Other Ambulatory Visit: Payer: Self-pay

## 2019-07-19 ENCOUNTER — Other Ambulatory Visit (HOSPITAL_COMMUNITY)
Admission: RE | Admit: 2019-07-19 | Discharge: 2019-07-19 | Disposition: A | Discharge status: Home / Self Care | Payer: Medicaid Other | Source: Ambulatory Visit | Attending: Family Medicine | Admitting: Family Medicine

## 2019-07-19 VITALS — BP 116/58 | HR 67 | Ht 65.0 in | Wt 198.0 lb

## 2019-07-19 DIAGNOSIS — N941 Unspecified dyspareunia: Secondary | ICD-10-CM | POA: Insufficient documentation

## 2019-07-19 DIAGNOSIS — Z113 Encounter for screening for infections with a predominantly sexual mode of transmission: Secondary | ICD-10-CM | POA: Diagnosis not present

## 2019-07-19 NOTE — Assessment & Plan Note (Addendum)
Patient presenting with acute on chronic history of dyspareunia (since 2018).  Denies any postmenopausal bleeding or abnormal vaginal discharge.  No red flag symptoms.  Pelvic exam notable for atrophic vaginitis and friable cervix and reproduction of pain with manipulation of cervix.  Patient does have multiple risk factors for symptoms including postmenopausal not on hormonal therapy and multiple prior pelvic surgeries. She is also on Effexor which could be contributing. Will obtain STD testing and pelvic ultrasound to further evaluate.  Recommended water-based lubrication for atrophic vaginitis.  If no improvement can consider starting vaginal estrogen.  Discussed findings and recommendations with patient.  She voiced understanding agreement plan.  Will call with remaining results and recommendations.

## 2019-07-19 NOTE — Progress Notes (Signed)
   Subjective:   Patient ID: Amanda Dickerson    DOB: 06/14/1960, 59 y.o. female   MRN: [id number]  Amanda Dickerson is a 59 y.o. female here for dyspareunia.   Dyspareunia: Patient presenting today for dyspareunia off and on since 2018. She has a history of endometrial ablation with novasure in 2013 secondary to dysfunctional uterine bleeding, tubal ligation in 2000, and C-section in 1988. She notes that it feels like there is a "blockage, like its hitting something". Denies any frictional pain. Denies any vaginal bleeding, but does endorse some vaginal discharge. Denies any abnormal discharge or itching. Denies dysuria. She is postmenopausal - LMP in 2013. Has occasional hot flashes. Denies any fevers, chills, pelvic pain.  Patient denies using lubrication. She is sexually active with one female partner but has had more than one female partner in the last year.   Transvaginal ultrasound in 2013 notable thickened endometrium and Heterogeneous increased echogenicity and tiny cystic foci in the posterior uterine myometrium, suspicious for adenomyosis.  No definite fibroids identified. Last pap-smear in 2019 that was negative for HPV or abnormal cytology.   Review of Systems:  Per HPI.   Objective:   BP (!) 116/58   Pulse 67   Ht 5' 5"  (1.651 m)   Wt 198 lb (89.8 kg)   SpO2 100%   BMI 32.95 kg/m  Vitals and nursing note reviewed.  General: Pleasant older female, sitting comfortably in exam chair, well nourished, well developed, in no acute distress with non-toxic appearance Resp: Breathing comfortably on room air, speaking full sentences Skin: warm, dry Extremities: warm and well perfused MSK: gait normal Neuro: Alert and oriented, speech normal Pelvic exam: VULVA: normal appearing vulva with no masses, tenderness or lesions, VAGINA: atrophic, areas of petechia and bleeding with manipulation, CERVIX: friable with easy bleeding, no lesions, cervical discharge present - white, creamy and  grey, UTERUS: uterus was tender to manipulation of cervix, ADNEXA: normal adnexa in size, nontender and no masses, exam chaperoned by Eusebio Friendly.   Assessment & Plan:   Dyspareunia, female Patient presenting with acute on chronic history of dyspareunia (since 2018).  Denies any postmenopausal bleeding or abnormal vaginal discharge.  No red flag symptoms.  Pelvic exam notable for atrophic vaginitis and friable cervix and reproduction of pain with manipulation of cervix.  Patient does have multiple risk factors for symptoms including postmenopausal not on hormonal therapy and multiple prior pelvic surgeries. She is also on Effexor which could be contributing. Will obtain STD testing and pelvic ultrasound to further evaluate.  Recommended water-based lubrication for atrophic vaginitis.  If no improvement can consider starting vaginal estrogen.  Discussed findings and recommendations with patient.  She voiced understanding agreement plan.  Will call with remaining results and recommendations.  Orders Placed This Encounter  Procedures  . US Pelvic Complete With Transvaginal    Epic order Wt-198lbs/prev u/s pelvis in 2013 in epic/no needs/ins-Medicaid/clc/[facility] aware of full bladder  No to Covid questions    Standing Status:   Future    Standing Expiration Date:   09/17/2020    Order Specific Question:   Reason for Exam (SYMPTOM  OR DIAGNOSIS REQUIRED)    Answer:   dysparunia    Order Specific Question:   Preferred imaging location?    Answer:   [facility]  . HIV antibody (with reflex)  . RPR  . Hepatitis C Antibody   Mina Marble, DO PGY-2, [facility] 07/19/2019 11:47 AM

## 2019-07-19 NOTE — Patient Instructions (Signed)
Thank you for coming to see me today. It was a pleasure to see you.   I think some of your discomfort is coming from vaginal atrophy which is when the vaginal area becomes very thin leading to bleeding after intercourse.  I would recommend water-based lubrication during intercourse to help with this.  Please let me know if this does not help or is not sufficient and we can try something else.  I have also asked schedule you for a pelvic ultrasound to further evaluate for other potential causes for your pain.  I will call you with the results and further recommendations.  I have also obtain STD testing.  I will call you with results and recommended treatments.  If you have any questions or concerns, please do not hesitate to call the office at [phone].  Take Care,   Dr. Mina Marble, DO Resident Physician [facility] [phone]

## 2019-07-20 LAB — CERVICOVAGINAL ANCILLARY ONLY
Bacterial Vaginitis (gardnerella): POSITIVE — AB
Chlamydia: NEGATIVE
Comment: NEGATIVE
Comment: NEGATIVE
Comment: NEGATIVE
Comment: NORMAL
Neisseria Gonorrhea: NEGATIVE
Trichomonas: NEGATIVE

## 2019-07-20 LAB — HEPATITIS C ANTIBODY: Hep C Virus Ab: <0.1 s/co ratio (ref 0.0–0.9)

## 2019-07-20 LAB — HIV ANTIBODY (ROUTINE TESTING W REFLEX): HIV Screen 4th Generation wRfx: NONREACTIVE

## 2019-07-20 LAB — RPR: RPR Ser Ql: NONREACTIVE

## 2019-07-21 ENCOUNTER — Other Ambulatory Visit: Payer: Self-pay | Admitting: Family Medicine

## 2019-07-21 DIAGNOSIS — N760 Acute vaginitis: Secondary | ICD-10-CM

## 2019-07-21 DIAGNOSIS — B9689 Other specified bacterial agents as the cause of diseases classified elsewhere: Secondary | ICD-10-CM

## 2019-07-21 MED ORDER — METRONIDAZOLE 500 MG PO TABS: 500.0000 mg | ORAL_TABLET | Freq: Two times a day (BID) | ORAL | 0 refills | Status: AC

## 2019-07-29 ENCOUNTER — Ambulatory Visit
Admission: RE | Admit: 2019-07-29 | Discharge: 2019-07-29 | Disposition: A | Discharge status: Home / Self Care | Payer: Medicaid Other | Source: Ambulatory Visit | Attending: Family Medicine | Admitting: Family Medicine

## 2019-07-29 DIAGNOSIS — N941 Unspecified dyspareunia: Secondary | ICD-10-CM

## 2019-08-14 NOTE — Progress Notes (Signed)
Cardiology Office Note:    Date:  08/17/2019   ID:  Amanda Dickerson, DOB 13-Oct-1960, MRN [id number]  PCP:  Danna Hefty, DO  Cardiologist:  No primary care provider on file.  Electrophysiologist:  None   Referring MD: Danna Hefty, DO   No chief complaint on file.   History of Present Illness:    Amanda Dickerson is a 59 y.o. 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  She was referred by Dr. Erin Hearing for evaluation of irregular heartbeat, initially seen on 06/15/2019.  She reports that she started having palpitations in the beginning of March.  Happens every day, can go on for hours.  Does not feel her heart is racing, but feels like heart beat is irregular.  Reports not having any palpitations this morning but had all day yesterday.  No smoking history.  Does not drink coffee, but will have occasional caffeinated soda.  Rare alcohol use.  Family history includes mother has atrial fibrillation and congestive heart failure.  Exercise Myoview 09/2014 showed no ischemia or infarct, EF 71%.  TTE 05/31/2019 showed normal biventricular function without evidence of significant valvular disease.  Labs on 05/27/2019 showed normal potassium, normal TSH.  Zio patch x3 days showed occasional PACs (2.3% of beats), with symptoms appearing to correspond to PACs.  Since last clinic visit, she reports that she has been doing well.  She has had only a few episodes of palpitations over the last 2 months.  Lasted few seconds and resolved.  She denies any chest pain.  Reports she has baseline dyspnea from asthma.  She has not been exercising.   Past Medical History:  Diagnosis Date  . Anemia   . Anxiety   . Arthritis    KNEES  . Asthma   . Colitis   . Dysfunctional uterine bleeding   . Fibromyalgia   . GERD (gastroesophageal reflux disease)   . Headache(784.0)   . History of cardiovascular  stress test    ETT-Myoview 7/16:  EF 71%, no ischemia or infarct; Low Risk    Past Surgical History:  Procedure Laterality Date  . CESAREAN SECTION  1988  . COLONOSCOPY    . ENDOMETRIAL ABLATION W/ NOVASURE  08/2011  . MULTIPLE TOOTH EXTRACTIONS    . TUBAL LIGATION  2000    Current Medications: Current Meds  Medication Sig  . albuterol (VENTOLIN HFA) 108 (90 Base) MCG/ACT inhaler INHALE 2 PUFFS EVERY 6 HOURS AS NEEDED FOR WHEEZE/SHORTNESS OF BREATH  . mesalamine (LIALDA) 1.2 g EC tablet Take 2 tablets (2.4 g total) by mouth 2 (two) times daily.  Marland Kitchen omeprazole (PRILOSEC) 20 MG capsule TAKE 1 CAPSULE BY MOUTH EVERY DAY  . polyethylene glycol powder (GLYCOLAX/MIRALAX) powder Mix 17 grams in 8 oz of water 1-2 x daily  . venlafaxine (EFFEXOR) 75 MG tablet TAKE 1 TABLET BY MOUTH TWICE A DAY     Allergies:   Ibuprofen and Penicillins   Social History   Socioeconomic History  . Marital status: Legally Separated    Spouse name: Not on file  . Number of children: 1  . Years of education: 53  . Highest education level: Not on file  Occupational History  . Occupation: Disabled  Tobacco Use  . Smoking status: Never Smoker  . Smokeless tobacco: Never Used  Vaping Use  . Vaping Use: Never used  Substance and Sexual Activity  . Alcohol use: Yes  Comment: occasionally  . Drug use: No  . Sexual activity: Never    Birth control/protection: Surgical  Other Topics Concern  . Not on file  Social History Narrative   Lives alone   Caffeine - rarely   Social Determinants of Health   Financial Resource Strain:   . Difficulty of Paying Living Expenses:   Food Insecurity:   . Worried About Charity fundraiser in the Last Year:   . Arboriculturist in the Last Year:   Transportation Needs:   . Film/video editor (Medical):   Marland Kitchen Lack of Transportation (Non-Medical):   Physical Activity:   . Days of Exercise per Week:   . Minutes of Exercise per Session:   Stress:   . Feeling of  Stress :   Social Connections:   . Frequency of Communication with Friends and Family:   . Frequency of Social Gatherings with Friends and Family:   . Attends Religious Services:   . Active Member of Clubs or Organizations:   . Attends Archivist Meetings:   Marland Kitchen Marital Status:      Family History: The patient's family history includes Colon polyps in her mother and sister; Diabetes in her father; Heart disease in her mother; Hyperlipidemia in her mother; Hypertension in her mother; Other in her mother; Prostate cancer in her father. There is no history of Esophageal cancer, Colon cancer, Stomach cancer, or Rectal cancer.  ROS:   Please see the history of present illness.     All other systems reviewed and are negative.  EKGs/Labs/Other Studies Reviewed:    The following studies were reviewed today:   EKG:  EKG is  ordered today.  The ekg ordered today demonstrates normal sinus rhythm, rate 56, no ST/T abnormalities  TTE 05/31/19: 1. Left ventricular ejection fraction, by estimation, is 65 to 70%. The  left ventricle has normal function. The left ventricle has no regional  wall motion abnormalities. Left ventricular diastolic parameters were  normal.  2. Right ventricular systolic function is normal. The right ventricular  size is normal. There is mildly elevated pulmonary artery systolic  pressure.  3. The mitral valve is normal in structure. Trivial mitral valve  regurgitation. No evidence of mitral stenosis.  4. The aortic valve is tricuspid. Aortic valve regurgitation is not  visualized. No aortic stenosis is present.   Conclusion(s)/Recommendation(s): Normal biventricular function without  evidence of hemodynamically significant valvular heart disease.   Lexiscan Myoview 09/21/2014:  The left ventricular ejection fraction is hyperdynamic (>65%).  Nuclear stress EF: 71%.  There was no ST segment deviation noted during stress.  The study is normal.  This  is a low risk study.   Normal study with no evidence of infarct or ischemia.     Recent Labs: 05/27/2019: BUN 11; Creatinine, Ser 0.70; Hemoglobin 13.2; Platelets 256; Potassium 4.2; Sodium 141; TSH 1.550 08/15/2019: ALT 32  Recent Lipid Panel    Component Value Date/Time   CHOL 195 10/20/2018 1013   TRIG 131 10/20/2018 1013   HDL 71 10/20/2018 1013   CHOLHDL 2.7 10/20/2018 1013   CHOLHDL 3.4 07/26/2015 0918   VLDL 28 07/26/2015 0918   LDLCALC 98 10/20/2018 1013    Physical Exam:    VS:  BP 126/82   Pulse (!) 56   Ht 5' 5"  (1.651 m)   Wt 197 lb 3.2 oz (89.4 kg)   SpO2 97%   BMI 32.82 kg/m     Wt Readings from  Last 3 Encounters:  08/15/19 197 lb 3.2 oz (89.4 kg)  07/19/19 198 lb (89.8 kg)  06/20/19 201 lb 2 oz (91.2 kg)     GEN:  Well nourished, well developed in no acute distress HEENT: Normal NECK: No JVD; No carotid bruits LYMPHATICS: No lymphadenopathy CARDIAC: irregular, normal rate, no murmurs, rubs, gallops RESPIRATORY:  Clear to auscultation without rales, wheezing or rhonchi  ABDOMEN: Soft, non-tender, non-distended MUSCULOSKELETAL:  No edema; No deformity  SKIN: Warm and dry NEUROLOGIC:  Alert and oriented x 3 PSYCHIATRIC:  Normal affect   ASSESSMENT:    1. Palpitations   2. Premature atrial contractions    PLAN:     Palpitations: Symptoms corresponded to PACs and Zio patch.  PACs represented 2.3% of beats on monitor.  Normal thyroid function.  TTE on 06/01/2019 showed normal systolic function.  Given PACs are benign and patient reports improvement in symptoms recently with palpitations occurring infrequently, no indication for treatment at this time  Prediabetes: A1c 6.1  RTC in 1 year   Medication Adjustments/Labs and Tests Ordered: Current medicines are reviewed at length with the patient today.  Concerns regarding medicines are outlined above.  Orders Placed This Encounter  Procedures  . EKG 12-Lead   No orders of the defined types were  placed in this encounter.   Patient Instructions  Medication Instructions:  Your physician recommends that you continue on your current medications as directed. Please refer to the Current Medication list given to you today.  Follow-Up: At [facility], you and your health needs are our priority.  As part of our continuing mission to provide you with exceptional heart care, we have created designated Provider Care Teams.  These Care Teams include your primary Cardiologist (physician) and Advanced Practice Providers (APPs -  Physician Assistants and Nurse Practitioners) who all work together to provide you with the care you need, when you need it.  We recommend signing up for the patient portal called "MyChart".  Sign up information is provided on this After Visit Summary.  MyChart is used to connect with patients for Virtual Visits (Telemedicine).  Patients are able to view lab/test results, encounter notes, upcoming appointments, etc.  Non-urgent messages can be sent to your provider as well.   To learn more about what you can do with MyChart, go to [email or web address].    Your next appointment:   12 month(s)  The format for your next appointment:   In Person  Provider:   Oswaldo Milian, MD       Signed, Donato Heinz, MD  08/17/2019 12:31 AM    [place]

## 2019-08-15 ENCOUNTER — Encounter: Payer: Self-pay | Admitting: Cardiology

## 2019-08-15 ENCOUNTER — Telehealth: Payer: Self-pay | Admitting: General Surgery

## 2019-08-15 ENCOUNTER — Other Ambulatory Visit (INDEPENDENT_AMBULATORY_CARE_PROVIDER_SITE_OTHER): Payer: Medicaid Other

## 2019-08-15 ENCOUNTER — Other Ambulatory Visit: Payer: Self-pay

## 2019-08-15 ENCOUNTER — Ambulatory Visit (INDEPENDENT_AMBULATORY_CARE_PROVIDER_SITE_OTHER): Payer: Medicaid Other | Admitting: Cardiology

## 2019-08-15 VITALS — BP 126/82 | HR 56 | Ht 65.0 in | Wt 197.2 lb

## 2019-08-15 DIAGNOSIS — I491 Atrial premature depolarization: Secondary | ICD-10-CM | POA: Diagnosis not present

## 2019-08-15 DIAGNOSIS — R002 Palpitations: Secondary | ICD-10-CM | POA: Diagnosis not present

## 2019-08-15 DIAGNOSIS — K760 Fatty (change of) liver, not elsewhere classified: Secondary | ICD-10-CM

## 2019-08-15 LAB — HEPATIC FUNCTION PANEL
ALT: 32 U/L (ref 0–35)
AST: 22 U/L (ref 0–37)
Albumin: 4.3 g/dL (ref 3.5–5.2)
Alkaline Phosphatase: 76 U/L (ref 39–117)
Bilirubin, Direct: 0.1 mg/dL (ref 0.0–0.3)
Total Bilirubin: 0.4 mg/dL (ref 0.2–1.2)
Total Protein: 7.2 g/dL (ref 6.0–8.3)

## 2019-08-15 NOTE — Patient Instructions (Signed)
Medication Instructions:  Your physician recommends that you continue on your current medications as directed. Please refer to the Current Medication list given to you today.  Follow-Up: At [facility], you and your health needs are our priority.  As part of our continuing mission to provide you with exceptional heart care, we have created designated Provider Care Teams.  These Care Teams include your primary Cardiologist (physician) and Advanced Practice Providers (APPs -  Physician Assistants and Nurse Practitioners) who all work together to provide you with the care you need, when you need it.  We recommend signing up for the patient portal called "MyChart".  Sign up information is provided on this After Visit Summary.  MyChart is used to connect with patients for Virtual Visits (Telemedicine).  Patients are able to view lab/test results, encounter notes, upcoming appointments, etc.  Non-urgent messages can be sent to your provider as well.   To learn more about what you can do with MyChart, go to [email or web address].    Your next appointment:   12 month(s)  The format for your next appointment:   In Person  Provider:   Oswaldo Milian, MD

## 2019-08-15 NOTE — Telephone Encounter (Signed)
Notified the patient that her liver enzymes were normal. Patient verbalized understanding.

## 2019-08-15 NOTE — Telephone Encounter (Signed)
-----   Message from Noralyn Pick, NP sent at 08/15/2019  1:29 PM EDT ----- Olivia Mackie, please contact the patient and let her know her liver enzymes are normal thx

## 2019-09-02 ENCOUNTER — Other Ambulatory Visit: Payer: Self-pay | Admitting: Family Medicine

## 2019-09-02 DIAGNOSIS — R232 Flushing: Secondary | ICD-10-CM

## 2019-09-05 ENCOUNTER — Other Ambulatory Visit: Payer: Self-pay | Admitting: Gastroenterology

## 2019-11-10 ENCOUNTER — Other Ambulatory Visit: Payer: Self-pay | Admitting: Family Medicine

## 2019-11-10 DIAGNOSIS — K219 Gastro-esophageal reflux disease without esophagitis: Secondary | ICD-10-CM

## 2019-12-19 ENCOUNTER — Other Ambulatory Visit: Payer: Self-pay | Admitting: Family Medicine

## 2019-12-19 DIAGNOSIS — R232 Flushing: Secondary | ICD-10-CM

## 2019-12-20 ENCOUNTER — Other Ambulatory Visit: Payer: Self-pay | Admitting: Family Medicine

## 2019-12-20 DIAGNOSIS — J4520 Mild intermittent asthma, uncomplicated: Secondary | ICD-10-CM

## 2020-03-12 ENCOUNTER — Other Ambulatory Visit: Payer: Self-pay | Admitting: Family Medicine

## 2020-03-12 DIAGNOSIS — R232 Flushing: Secondary | ICD-10-CM

## 2020-03-14 ENCOUNTER — Other Ambulatory Visit: Payer: Self-pay

## 2020-03-14 ENCOUNTER — Other Ambulatory Visit: Payer: Self-pay | Admitting: Family Medicine

## 2020-03-14 DIAGNOSIS — J4520 Mild intermittent asthma, uncomplicated: Secondary | ICD-10-CM

## 2020-03-14 MED ORDER — MOMETASONE FURO-FORMOTEROL FUM 100-5 MCG/ACT IN AERO: 2.0000 | INHALATION_SPRAY | RESPIRATORY_TRACT | 3 refills | Status: DC | PRN

## 2020-03-14 MED ORDER — ALBUTEROL SULFATE HFA 108 (90 BASE) MCG/ACT IN AERS: INHALATION_SPRAY | RESPIRATORY_TRACT | 3 refills | Status: DC

## 2020-03-14 NOTE — Addendum Note (Signed)
Addended by: Danna Hefty on: 03/14/2020 05:34 PM   Modules accepted: Orders

## 2020-03-14 NOTE — Telephone Encounter (Signed)
No alternatives covered.  Can consider transition to Symbicort or Dulera for as needed use.

## 2020-03-21 ENCOUNTER — Other Ambulatory Visit: Payer: Self-pay

## 2020-03-21 ENCOUNTER — Telehealth (INDEPENDENT_AMBULATORY_CARE_PROVIDER_SITE_OTHER): Payer: Medicaid Other | Admitting: Family Medicine

## 2020-03-21 DIAGNOSIS — N941 Unspecified dyspareunia: Secondary | ICD-10-CM | POA: Diagnosis not present

## 2020-03-21 MED ORDER — ESTROGENS, CONJUGATED 0.625 MG/GM VA CREA: TOPICAL_CREAM | VAGINAL | 12 refills | Status: DC

## 2020-03-21 NOTE — Progress Notes (Signed)
[place] Telemedicine Visit  Patient consented to have virtual visit and was identified by name and date of birth. Method of visit: Video  Encounter participants: Patient: Amanda Dickerson - located at home Provider: Danna Hefty - located at home Others (if applicable): none  Chief Complaint: Pain with intercourse   HPI: Patient presents for telemedicine visit for continued dyspareunia that has been occurring since 2018. She described it as like a 'blockage, like hitting something". She denied any vaginal bleeding or frictional pain. She is postmenopausal with LMP in 2013. She is sexually active with one female partner where they  use lubrication with improvement.  She was evaluated for this in May 2021. Pelvic exam at that time notable for vaginal atrophy with friability to vaginal wall and cervix. She had reproduction of pain with palpation to cervix. She had a normal pelvic ultrasound. She was recommended to use water-based lubrication.   Today she notes that it is very painful for her to have any type of intercourse. It is painful just to even enter.     ROS: per HPI  Pertinent PMHx: endometrial ablation with novasure in 2013 secondary to dysfunctional uterine bleeding, tubal ligation in 2000, and C-section in 1988  Transvaginal ultrasound in 2013 notable thickened endometrium and Heterogeneous increased echogenicity and tiny cystic foci in the posterior uterine myometrium, suspicious for adenomyosis. No definite fibroids identified.   Transvaginal ultrasound May 2021: normal, no fibroids or masses. Endometrial thickness of 2m. Ovaries not identified.  Last pap-smear in 2019 that was negative for HPV or abnormal cytology.  Exam:  There were no vitals taken for this visit.  Respiratory: speaking in full sentences, breathing comfortably on room air  Assessment/Plan:  Dyspareunia, female Chronic. Intolerant to water-based lubrication. Last pelvic  exam notable for vaginal atrophy and some pain to cervical manipulation which may be due to prior pelvic surgeries. Normal pelvic ultrasound. Post-menopausal with LMP 2013. Last STD screen negative.  - vaginal estrogen cream daily x 2 weeks, then twice weekly - referral to gynecology for further evaluation [facility]    Time spent during visit with patient: 15 minutes  KMina Marble [place] PGY3 03/21/2020 1:35 PM

## 2020-03-21 NOTE — Assessment & Plan Note (Signed)
Chronic. Intolerant to water-based lubrication. Last pelvic exam notable for vaginal atrophy and some pain to cervical manipulation which may be due to prior pelvic surgeries. Normal pelvic ultrasound. Post-menopausal with LMP 2013. Last STD screen negative.  - vaginal estrogen cream daily x 2 weeks, then twice weekly - referral to gynecology for further evaluation (has been seen at [facility] in past)

## 2020-03-23 ENCOUNTER — Telehealth: Payer: Self-pay

## 2020-03-23 DIAGNOSIS — R232 Flushing: Secondary | ICD-10-CM

## 2020-03-23 DIAGNOSIS — N941 Unspecified dyspareunia: Secondary | ICD-10-CM

## 2020-03-23 DIAGNOSIS — J4520 Mild intermittent asthma, uncomplicated: Secondary | ICD-10-CM

## 2020-03-23 MED ORDER — MOMETASONE FURO-FORMOTEROL FUM 100-5 MCG/ACT IN AERO: 2.0000 | INHALATION_SPRAY | RESPIRATORY_TRACT | 3 refills | Status: DC | PRN

## 2020-03-23 MED ORDER — ESTROGENS, CONJUGATED 0.625 MG/GM VA CREA: TOPICAL_CREAM | VAGINAL | 12 refills | Status: DC

## 2020-03-23 MED ORDER — VENLAFAXINE HCL 75 MG PO TABS: 75.0000 mg | ORAL_TABLET | Freq: Two times a day (BID) | ORAL | 2 refills | Status: DC

## 2020-03-23 NOTE — Telephone Encounter (Signed)
Patient calls nurse line stating she can not pick up her last 3 prescriptions sent in by [facility]. Patient reports Tarry Kos is not a Materials engineer. I called the pharmacy and Effexor, Premarin, and Dulera need an attending signature. Will forward to preceptor to resend in.

## 2020-03-23 NOTE — Telephone Encounter (Signed)
Reviewed and Rx refilled.  Dorris Singh, MD  Family Medicine Teaching Service

## 2020-03-28 ENCOUNTER — Other Ambulatory Visit: Payer: Self-pay

## 2020-03-28 ENCOUNTER — Ambulatory Visit (INDEPENDENT_AMBULATORY_CARE_PROVIDER_SITE_OTHER): Payer: Medicaid Other

## 2020-03-28 DIAGNOSIS — Z23 Encounter for immunization: Secondary | ICD-10-CM | POA: Diagnosis present

## 2020-03-28 NOTE — Progress Notes (Signed)
   Covid-19 Vaccination Clinic  Name:  Amanda Dickerson    MRN: [id number] DOB: Sep 29, 1960  03/28/2020  Amanda Dickerson was observed post Covid-19 immunization for 15 minutes without incident. She was provided with Vaccine Information Sheet and instruction to access the V-Safe system.   Amanda Dickerson was instructed to call 911 with any severe reactions post vaccine: Marland Kitchen Difficulty breathing  . Swelling of face and throat  . A fast heartbeat  . A bad rash all over body  . Dizziness and weakness   Booster administered RD without complication.

## 2020-04-09 ENCOUNTER — Telehealth: Payer: Self-pay | Admitting: Family Medicine

## 2020-04-09 NOTE — Telephone Encounter (Signed)
Patient is needing doctor to call in something for anxiety. States she called the nurse line around the 31st and left a message and no one has contacted her back. Please advise. Thanks!

## 2020-04-10 NOTE — Telephone Encounter (Signed)
Spoke to patient. Given she has never been evaluated for this, recommended official office visit. Patient was amendable to this. Scheduled with me on 2/11 at 9:30am

## 2020-04-12 NOTE — Progress Notes (Signed)
Subjective:   Patient ID: Amanda Dickerson    DOB: 08-Mar-1960, 60 y.o. female   MRN: [id number]  Amanda Dickerson is a 60 y.o. female with a history of asthma, GERD, proctosigmoiditis, dyspareunia, episodic vertigo, fibromyalgia, HLD, irregular heart beat, obesity, prediabetes here for anxiety  Depression/Anxiety: Patient notes that she lost her mom in November and then lost her significant other 3 months later. She has had a hard time coping with these losses. She does have a few friends that check in on her. She denies active or passive SI/HI. She endorses poor energy or drive, poor sleep. She is interested in both therapy and medical management. She has been taking Effexor for a long time for pain management in setting of fibromyalgia. Notes unwanted side effects with Trazodone in the past.  GAD-7 score: 14 PHQ-9 score: 23, negative to answer #9  Review of Systems:  Per HPI.   Objective:   BP 118/80   Pulse 66   Ht 5' 4"  (1.626 m)   Wt 198 lb 12.8 oz (90.2 kg)   SpO2 100%   BMI 34.12 kg/m  Vitals and nursing note reviewed.  General: pleasant older woman, tearful throughout exam, sitting comfortably in exam chair, well nourished, well developed, in no acute distress with non-toxic appearance Resp: breathing comfortably on room air, speaking in full sentences Skin: warm, dry MSK: gait normal Neuro: Alert and oriented, speech normal Psych:  Cognition and judgment appear intact. Alert, communicative  and cooperative with normal attention span and concentration. No apparent delusions, illusions, hallucinations. Tearful.  GAD 7 : Generalized Anxiety Score 04/13/2020  Nervous, Anxious, on Edge 2  Control/stop worrying 2  Worry too much - different things 3  Trouble relaxing 3  Restless 3  Easily annoyed or irritable 1  Afraid - awful might happen 0  Total GAD 7 Score 14  Anxiety Difficulty Somewhat difficult   Depression screen [facility] 2/9 04/13/2020 07/19/2019 01/07/2019  Decreased  Interest 3 0 0  Down, Depressed, Hopeless 3 0 0  PHQ - 2 Score 6 0 0  Altered sleeping 3 - -  Tired, decreased energy 3 - -  Change in appetite 3 - -  Feeling bad or failure about yourself  2 - -  Trouble concentrating 3 - -  Moving slowly or fidgety/restless 3 - -  Suicidal thoughts 0 - -  PHQ-9 Score 23 - -  Difficult doing work/chores Somewhat difficult - -    Assessment & Plan:   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 prior episode ([place]) Acute in setting of loss of two family members. PHQ-9 score of 23 today. Denies SI/HI. Interested in therapy. - Augment Effexor 84m BID with Bupropion 1569mQD - Hydroxyzine 25-503mHS up to QID PRN for anxiety - follow up in 2 weeks - Discussed case with Dr. LevHartford Polio will reach out to patient on Monday - Also provided information for [facility] Grief support   GAD (generalized anxiety disorder) Acute. Apparently mom has history of chronic anxiety. Recently lost mom and significant other within last 6 months. GAD-7 elevated to 14 today.  - plan as above  No orders of the defined types were placed in this encounter.  Meds ordered this encounter  Medications  . buPROPion (WELLBUTRIN XL) 150 MG 24 hr tablet    Sig: Take 1 tablet (150 mg total) by mouth daily.    Dispense:  30 tablet    Refill:  0  .  hydrOXYzine (VISTARIL) 25 MG capsule    Sig: Take 1-2 capsules (25-50 mg total) by mouth 4 (four) times daily as needed for anxiety (insomnia).    Dispense:  60 capsule    Refill:  0    Mina Marble, DO PGY-3, [facility] 04/13/2020 2:05 PM

## 2020-04-13 ENCOUNTER — Other Ambulatory Visit: Payer: Self-pay | Admitting: Family Medicine

## 2020-04-13 ENCOUNTER — Other Ambulatory Visit: Payer: Self-pay

## 2020-04-13 ENCOUNTER — Ambulatory Visit (INDEPENDENT_AMBULATORY_CARE_PROVIDER_SITE_OTHER): Payer: Medicaid Other | Admitting: Family Medicine

## 2020-04-13 VITALS — BP 118/80 | HR 66 | Ht 64.0 in | Wt 198.8 lb

## 2020-04-13 DIAGNOSIS — F322 Major depressive disorder, single episode, severe without psychotic features: Secondary | ICD-10-CM | POA: Diagnosis not present

## 2020-04-13 DIAGNOSIS — F4321 Adjustment disorder with depressed mood: Secondary | ICD-10-CM

## 2020-04-13 DIAGNOSIS — F411 Generalized anxiety disorder: Secondary | ICD-10-CM

## 2020-04-13 MED ORDER — HYDROXYZINE PAMOATE 25 MG PO CAPS: 25.0000 mg | ORAL_CAPSULE | Freq: Four times a day (QID) | ORAL | 0 refills | Status: DC | PRN

## 2020-04-13 MED ORDER — BUPROPION HCL ER (XL) 150 MG PO TB24: 150.0000 mg | ORAL_TABLET | Freq: Every day | ORAL | 0 refills | Status: DC

## 2020-04-13 NOTE — Telephone Encounter (Signed)
I have asked residency admin to look into this.  Christen Bame, CMA

## 2020-04-13 NOTE — Telephone Encounter (Signed)
I have received notification several times now that I am not enrolled in Medicare.

## 2020-04-13 NOTE — Patient Instructions (Addendum)
It was a pleasure to see you today!  Thank you for choosing [facility] for your primary care.   Our plans for today were:  Continue the Effexor 48m twice a day  Start Bupropion 1581min the morning  Start Hydroxyzine 25-50106m1-2 tablets) at night for sleep. You can also take as needed for increased anxiety.   Please expect a call from Dr. LevHartford Poliom [facility] grief couseling. You can also consider [facility] with [facility] information below.     Lets plan to follow up in two weeks. 04/27/20 at 1;30pm via video chat.  Best Wishes,   Amanda Dickerson   [facility] [id number]

## 2020-04-13 NOTE — Assessment & Plan Note (Signed)
Acute in setting of loss of two family members. PHQ-9 score of 23 today. Denies SI/HI. Interested in therapy. - Augment Effexor 3m BID with Bupropion 1532mQD - Hydroxyzine 25-5059mHS up to QID PRN for anxiety - follow up in 2 weeks - Discussed case with Dr. LevHartford Polio will reach out to patient on Monday - Also provided information for [facility] Grief support

## 2020-04-13 NOTE — Telephone Encounter (Signed)
Could you also send this prescription as well?

## 2020-04-13 NOTE — Telephone Encounter (Signed)
Dr. Andria Frames, would you be able to send the rx to patient in replacement of me in the mean time?

## 2020-04-13 NOTE — Assessment & Plan Note (Signed)
Acute. Apparently mom has history of chronic anxiety. Recently lost mom and significant other within last 6 months. GAD-7 elevated to 14 today.  - plan as above

## 2020-04-16 ENCOUNTER — Other Ambulatory Visit: Payer: Self-pay | Admitting: Gastroenterology

## 2020-04-16 ENCOUNTER — Other Ambulatory Visit: Payer: Self-pay

## 2020-04-16 MED ORDER — MESALAMINE 1.2 G PO TBEC: 2.4000 g | DELAYED_RELEASE_TABLET | Freq: Two times a day (BID) | ORAL | 0 refills | Status: DC

## 2020-04-20 ENCOUNTER — Ambulatory Visit (INDEPENDENT_AMBULATORY_CARE_PROVIDER_SITE_OTHER): Payer: Medicaid Other | Admitting: Psychology

## 2020-04-20 ENCOUNTER — Other Ambulatory Visit: Payer: Self-pay

## 2020-04-20 DIAGNOSIS — F4321 Adjustment disorder with depressed mood: Secondary | ICD-10-CM | POA: Diagnosis not present

## 2020-04-20 NOTE — BH Specialist Note (Signed)
Integrated Behavioral Health Initial In-Person Visit  MRN: [id number] Name: Amanda Dickerson  Number of [place] Clinician visits:: 1/6 Session Start time: 9  Session End time: 143 Total time: 45  minutes  Types of Service: Individual psychotherapy    Subjective: Amanda Dickerson is a 60 y.o. female  Patient was referred by Dr. Tarry Kos for grief Patient reports the following symptoms/concerns: Pt lost her mother and son's father in close time proximity. Pt shared feelings of worthlessness, overwhelming worry thoughts, thoughts of shame and guilt.  Pt shared her son has been struggling with the loss of his father.  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  Validated process of grief and emotions.    Duration of problem: past 36month; Severity of problem: mild  Objective: Mood: Euthymic and Affect: Appropriate Risk of harm to self or others: No plan to harm self or others  Life Context: Family and Social: lost mother and son's father recently School/Work: on disability   Patient and/or Family's Strengths/Protective Factors: Social and Emotional competence  Goals Addressed: Patient will: 1. Reduce symptoms of: grief: feelings of worry, feeling overwhelmed, down and depressed  2. Increase knowledge and/or ability of: self-management skills  3. Demonstrate ability to: Begin healthy grieving over loss  Progress towards Goals: Ongoing  Interventions: Interventions utilized: Supportive Reflection  Standardized Assessments completed: PHQ 9  Patient and/or Family Response: Pt engaged in treatment planning   Patient Centered Plan: Patient is on the following Treatment Plan(s):  Grief processing treatment plan   Assessment: Patient currently experiencing anxiety and depression sx due to the loss of two close family members..   Patient may benefit from emotional processing and  self management strategies  Plan: 1. Follow up with behavioral health clinician on : 1 week 2. Behavioral recommendations: emotion processing  3. Referral(s): [facility])  RErlinda Hong PhD., LMFT-A

## 2020-04-25 ENCOUNTER — Other Ambulatory Visit: Payer: Self-pay

## 2020-04-25 ENCOUNTER — Ambulatory Visit: Payer: Medicaid Other | Admitting: Psychology

## 2020-04-25 DIAGNOSIS — F4321 Adjustment disorder with depressed mood: Secondary | ICD-10-CM | POA: Diagnosis not present

## 2020-04-26 NOTE — BH Specialist Note (Signed)
Integrated Behavioral Health Follow Up In-Person Visit  MRN: [id number] Name: Amanda Dickerson  Number of [place] Clinician visits: 2/6 Session Start time: 169  Session End time: 415 Total time: 45  minutes  Types of Service: Individual psychotherapy   Subjective: Amanda Dickerson is a 60 y.o. female  Patient was referred by Dr. Tarry Kos for grief.. Patient reports the following symptoms/concerns: Pt reported she has been processing her grief.  Pt shared it is difficult for her to be vulnerable in front of her son because of family beliefs. Discussed process of grief and importance of sharing process honestly.   Discussed emotions around loss  Duration of problem: past 12month; Severity of problem: mild  Objective: Mood: Euthymic and Affect: Appropriate Risk of harm to self or others: No plan to harm self or others  Life Context: Family and Social: lost mother and son's father recently School/Work: on disability   Patient and/or Family's Strengths/Protective Factors: Social and Emotional competence  Goals Addressed: Patient will: 1. Reduce symptoms of: grief: feelings of worry, feeling overwhelmed, down and depressed  2. Increase knowledge and/or ability of: self-management skills: finding ways to process emotions 3. Demonstrate ability to: Begin healthy grieving over loss  Progress towards Goals: Ongoing  Interventions: Interventions utilized: Supportive Reflection  Standardized Assessments completed: PHQ 9  Patient and/or Family Response: Pt engaged in treatment planning   Patient Centered Plan: Patient is on the following Treatment Plan(s):  Grief processing treatment plan   Assessment: Patient currently experiencing anxiety and depression sx due to the loss of two close family members..   Patient may benefit from emotional processing and self management strategies  Plan: 1. Follow up with behavioral health clinician on : 1  week 2. Behavioral recommendations: emotion processing  3. Referral(s): [facility](In Clinic)  RErlinda Hong PhD., LMFT-A

## 2020-04-27 ENCOUNTER — Telehealth (INDEPENDENT_AMBULATORY_CARE_PROVIDER_SITE_OTHER): Payer: Medicaid Other | Admitting: Family Medicine

## 2020-04-27 ENCOUNTER — Ambulatory Visit: Payer: Medicaid Other | Admitting: Psychology

## 2020-04-27 DIAGNOSIS — F322 Major depressive disorder, single episode, severe without psychotic features: Secondary | ICD-10-CM

## 2020-04-27 NOTE — Progress Notes (Signed)
[facility] Telemedicine Visit  Patient consented to have virtual visit and was identified by name and date of birth. Method of visit: Video  Encounter participants: Patient: Amanda Dickerson - located at home Provider: Danna Hefty - located at [facility] Others (if applicable): None  Chief Complaint: Depression follow up  HPI: Patient last seen on 04/13/20 for worsening depression with PHQ-9 of 23. She was continued on Effexor 38m BID and augmented with Bupropion 1540mQD. She was also given Hydroxyzine 25-5054mHS up to QID PRN for anxiety. She was also given resources to start therapy. Today she notes things are "going a little better". She feels like she is holding things together better. She has been seen by therapy a few times and that is going well. She has been using the Hydroxyzine 49m73m night which has helped her sleep better. She has seen Dr. LevyHartford Polinise any side effects. Denies any SI/HI  ROS: per HPI  Pertinent PMHx: recent loss of multiple family members  Exam:  There were no vitals taken for this visit.  Respiratory: speaking in full sentences  Assessment/Plan:  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 prior episode (HCC) Chronic, slowly improving per patient. Tolerating Effexor and Bupropion with Hydroxyzine qHS for sleep. Also undergoing therapy with Dr. LevyHartford Polinies SI/HI. - continue Effexor 75mg29m and Bupropion 150mg 64m Continue Hydroxyzine 49mg q63mnd PRN for increased anxiety - continue to follow up with Dr. Levy asHartford Polieduled for therapy - follow up scheduled with Dr. Deja Pisarski Tarry Kosntinued surveillance on 06/04/20 (patient preference) - plan to obtain updated PHQ-9 score at that time    Time spent during visit with patient: 15 minutes  KiersteMina MarbleneDayton2/25/2022 1:50 PM

## 2020-04-27 NOTE — Assessment & Plan Note (Addendum)
Chronic, slowly improving per patient. Tolerating Effexor and Bupropion with Hydroxyzine qHS for sleep. Also undergoing therapy with Dr. Hartford Poli. Denies SI/HI. - continue Effexor 78m BID and Bupropion 1544mQD - Continue Hydroxyzine 2529mHS and PRN for increased anxiety - continue to follow up with Dr. LevHartford Poli scheduled for therapy - follow up scheduled with Dr. MulTarry Kosr continued surveillance on 06/04/20 (patient preference) - plan to obtain updated PHQ-9 score at that time

## 2020-04-30 ENCOUNTER — Other Ambulatory Visit: Payer: Self-pay

## 2020-04-30 ENCOUNTER — Encounter: Payer: Self-pay | Admitting: Obstetrics and Gynecology

## 2020-04-30 ENCOUNTER — Ambulatory Visit (INDEPENDENT_AMBULATORY_CARE_PROVIDER_SITE_OTHER): Payer: Medicaid Other | Admitting: Obstetrics and Gynecology

## 2020-04-30 ENCOUNTER — Other Ambulatory Visit (HOSPITAL_COMMUNITY)
Admission: RE | Admit: 2020-04-30 | Discharge: 2020-04-30 | Disposition: A | Discharge status: Home / Self Care | Payer: Medicaid Other | Source: Ambulatory Visit | Attending: Obstetrics and Gynecology | Admitting: Obstetrics and Gynecology

## 2020-04-30 VITALS — BP 124/85 | HR 71 | Ht 64.0 in | Wt 203.0 lb

## 2020-04-30 DIAGNOSIS — Z01419 Encounter for gynecological examination (general) (routine) without abnormal findings: Secondary | ICD-10-CM | POA: Insufficient documentation

## 2020-04-30 NOTE — Progress Notes (Addendum)
New GYN referral from [facility], presents for dyspareunia x 1+ years.  Last PAP 06/09/2017. Last Mammogram 07/22/2017

## 2020-04-30 NOTE — Progress Notes (Signed)
Subjective:     Amanda Dickerson is a 60 y.o. female P1 postmenopausal with BMI 34 who is here for a comprehensive physical exam. The patient reports no problems. Patient denies any episodes of postmenopausal vaginal bleeding. She denies pelvic pain or abnormal discharge. Patient reports some dyspareunia and vaginal dryness. She was diagnosed with BV a few months ago and is unsure if she was treated. Patient was prescribed estrogen cream and reports improvement in her dyspareunia. Patient is without any other complaints  Past Medical History:  Diagnosis Date  . Anemia   . Anxiety   . Arthritis    KNEES  . Asthma   . Colitis   . Dysfunctional uterine bleeding   . Fibromyalgia   . GERD (gastroesophageal reflux disease)   . Headache(784.0)   . History of cardiovascular stress test    ETT-Myoview 7/16:  EF 71%, no ischemia or infarct; Low Risk   Past Surgical History:  Procedure Laterality Date  . CESAREAN SECTION  1988  . COLONOSCOPY    . ENDOMETRIAL ABLATION W/ NOVASURE  08/2011  . MULTIPLE TOOTH EXTRACTIONS    . TUBAL LIGATION  2000   Family History  Problem Relation Age of Onset  . Hypertension Mother   . Heart disease Mother   . Hyperlipidemia Mother   . Other Mother        vertigo  . Colon polyps Mother   . Diabetes Father   . Prostate cancer Father   . Colon polyps Sister   . Esophageal cancer Neg Hx   . Colon cancer Neg Hx   . Stomach cancer Neg Hx   . Rectal cancer Neg Hx     Social History   Socioeconomic History  . Marital status: Divorced    Spouse name: Not on file  . Number of children: 1  . Years of education: 60  . Highest education level: Not on file  Occupational History  . Occupation: Disabled  Tobacco Use  . Smoking status: Never Smoker  . Smokeless tobacco: Never Used  Vaping Use  . Vaping Use: Never used  Substance and Sexual Activity  . Alcohol use: Yes    Comment: occasionally  . Drug use: No  . Sexual activity: Yes    Birth  control/protection: Surgical  Other Topics Concern  . Not on file  Social History Narrative   Lives alone   Caffeine - rarely   Social Determinants of Health   Financial Resource Strain: Not on file  Food Insecurity: Not on file  Transportation Needs: Not on file  Physical Activity: Not on file  Stress: Not on file  Social Connections: Not on file  Intimate Partner Violence: Not on file   Health Maintenance  Topic Date Due  . Samul Dada  09/25/2017  . MAMMOGRAM  07/23/2018  . INFLUENZA VACCINE  10/02/2019  . PAP SMEAR-Modifier  06/09/2020  . COLONOSCOPY (Pts 45-98yr Insurance coverage will need to be confirmed)  02/16/2029  . COVID-19 Vaccine  Completed  . Hepatitis C Screening  Completed  . HIV Screening  Completed       Review of Systems Pertinent items noted in HPI and remainder of comprehensive ROS otherwise negative.   Objective:  Blood pressure 124/85, pulse 71, height 5' 4"  (1.626 m), weight 203 lb (92.1 kg).     GENERAL: Well-developed, well-nourished female in no acute distress.  HEENT: Normocephalic, atraumatic. Sclerae anicteric.  NECK: Supple. Normal thyroid.  LUNGS: Clear to auscultation bilaterally.  HEART: Regular  rate and rhythm. BREASTS: Symmetric in size. No palpable masses or lymphadenopathy, skin changes, or nipple drainage. ABDOMEN: Soft, nontender, nondistended. No organomegaly. PELVIC: Normal external female genitalia. Vagina is pale and atrophic. Normal discharge. Normal appearing cervix. Uterus is normal in size.  No adnexal mass or tenderness. EXTREMITIES: No cyanosis, clubbing, or edema, 2+ distal pulses.    Assessment:    Healthy female exam.      Plan:    Pap smear collected Screening mammogram ordered Patient desires STD and vaginitis screening Patient will be contacted with abnormal results Patient to follow up with PCP as scheduled See After Visit Summary for Counseling Recommendations

## 2020-05-01 LAB — CERVICOVAGINAL ANCILLARY ONLY
Bacterial Vaginitis (gardnerella): NEGATIVE
Candida Glabrata: NEGATIVE
Candida Vaginitis: NEGATIVE
Chlamydia: NEGATIVE
Comment: NEGATIVE
Comment: NEGATIVE
Comment: NEGATIVE
Comment: NEGATIVE
Comment: NEGATIVE
Comment: NORMAL
Neisseria Gonorrhea: NEGATIVE
Trichomonas: NEGATIVE

## 2020-05-02 ENCOUNTER — Ambulatory Visit: Payer: Medicaid Other | Admitting: Psychology

## 2020-05-02 ENCOUNTER — Other Ambulatory Visit: Payer: Self-pay

## 2020-05-02 ENCOUNTER — Ambulatory Visit
Admission: RE | Admit: 2020-05-02 | Discharge: 2020-05-02 | Disposition: A | Discharge status: Home / Self Care | Payer: Medicaid Other | Source: Ambulatory Visit | Attending: Obstetrics and Gynecology | Admitting: Obstetrics and Gynecology

## 2020-05-02 DIAGNOSIS — F4321 Adjustment disorder with depressed mood: Secondary | ICD-10-CM | POA: Diagnosis not present

## 2020-05-02 DIAGNOSIS — Z01419 Encounter for gynecological examination (general) (routine) without abnormal findings: Secondary | ICD-10-CM

## 2020-05-02 DIAGNOSIS — Z1231 Encounter for screening mammogram for malignant neoplasm of breast: Secondary | ICD-10-CM | POA: Diagnosis not present

## 2020-05-02 LAB — CYTOLOGY - PAP
Comment: NEGATIVE
Diagnosis: NEGATIVE
High risk HPV: NEGATIVE
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
## 2020-05-09 ENCOUNTER — Other Ambulatory Visit: Payer: Self-pay

## 2020-05-09 ENCOUNTER — Ambulatory Visit: Payer: Medicaid Other | Admitting: Psychology

## 2020-05-09 DIAGNOSIS — F4321 Adjustment disorder with depressed mood: Secondary | ICD-10-CM | POA: Diagnosis not present

## 2020-05-09 NOTE — BH Specialist Note (Signed)
Integrated Behavioral Health Follow Up In-Person Visit  MRN: [id number] Name: Amanda Dickerson  Number of [place] Clinician visits: 4/6 Session Start time: 9  Session End time: 930 Total time: 30 minutes  Types of Service: Individual psychotherapy   Subjective: Amanda Dickerson is a 60 y.o. female  Patient was referred by Dr. Tarry Kos for grief. Patient reports the following symptoms/concerns: Pt reported anxiety the past week regarding anxious thoughts of "what should could've done" to prevent the death of her mother.  Discussed role of a caregiver and how that role is not being filled. Processed emotions around grief and loss and normalized process.   Pt reported she has been able to focus on her own emotions and is not allowing past relationship with friend be a part of life.  Duration of problem:past 27month; Severity of problem:mild  Objective: Mood:Euthymicand Affect: Appropriate Risk of harm to self or others:No plan to harm self or others  Life Context: Family and Social:lost mother and son's father recently School/Work:on disability   Patient and/or Family's Strengths/Protective Factors: Social and Emotional competence  Goals Addressed: Patient will: 1. Reduce symptoms oVK:PQAES feelings of worry, feeling overwhelmed, down and depressed 2. Increase knowledge and/or ability oLP:NPYY-FRTMYTRZNBskills: finding ways to process emotions 3. Demonstrate ability tVA:POLIDhealthy grieving over loss  Progress towards Goals: Ongoing  Interventions: Interventions utilized:Supportive Reflection Standardized Assessments completed:n/a  Patient and/or Family Response:Pt engaged in treatment planning  Patient Centered Plan: Patient is on the following Treatment Plan(s):Grief processing treatment plan  Assessment: Patient currently experiencinganxiety and depression sx due to the loss of two close family members.  Patient  may benefit fromemotional processing and self management strategies  Plan: 1. Follow up with behavioral health clinician on :2 weeks 2. Behavioral recommendations:emotion processing 3. Referral(s[place])  RErlinda Hong PhD., LMFT-A

## 2020-05-23 ENCOUNTER — Other Ambulatory Visit: Payer: Self-pay

## 2020-05-23 ENCOUNTER — Ambulatory Visit: Payer: Medicaid Other | Admitting: Psychology

## 2020-05-23 DIAGNOSIS — F4321 Adjustment disorder with depressed mood: Secondary | ICD-10-CM | POA: Diagnosis not present

## 2020-05-23 NOTE — BH Specialist Note (Signed)
Integrated Behavioral Health Follow Up In-Person Visit  MRN: [id number] Name: Amanda Dickerson  Number of [place] Clinician visits: 5/6 Session Start time: 9  Session End time: 930 Total time: 30 minutes  Types of Service: Individual psychotherapy   Subjective: Amanda Dickerson is a 60 y.o. female  Patient was referred by Dr. Tarry Kos for grief. Patient reports the following symptoms/concerns: Pt shared her beach trip was not a good as she wanted to be because she was not able to be "present".  Continued to discuss grief process and emotions being tied to them.  Encouraged to try groups for grief.   Duration of problem: past 33month; Severity of problem: mild  Objective: Mood: Euthymic and Affect: Appropriate Risk of harm to self or others: No plan to harm self or others  Life Context: Family and Social:lost mother and son's father recently School/Work:on disability   Patient and/or Family's Strengths/Protective Factors: Social and Emotional competence  Goals Addressed: Patient will: 1. Reduce symptoms oNG:EXBMW feelings of worry, feeling overwhelmed, down and depressed 2. Increase knowledge and/or ability oUX:LKGM-WNUUVOZDGUskills: finding ways to process emotions 3. Demonstrate ability tYQ:IHKVQhealthy grieving over loss  Progress towards Goals: Ongoing  Interventions: Interventions utilized:Supportive Reflection Standardized Assessments completed:n/a  Patient and/or Family Response:Pt engaged in treatment planning  Patient Centered Plan: Patient is on the following Treatment Plan(s):Grief processing treatment plan  Assessment: Patient currently experiencinganxiety and depression sx due to the loss of two close family members.  Patient may benefit fromemotional processing and self management strategies  Plan: 1. Follow up with behavioral health clinician on :2 weeks 2. Behavioral recommendations:emotion  processing 3. Referral(s[place] [place]([facility])  RErlinda Hong PhD., LMFT-A

## 2020-06-03 NOTE — Progress Notes (Addendum)
Subjective:   Patient ID: Amanda Dickerson    DOB: 02-23-1961, 60 y.o. female   MRN: [id number]  Amanda Dickerson is a 60 y.o. female with a history of asthma, proctosigmoiditis, GERD, pre-diabetes, obesity, irregular heart beat, HLD, grief, GAD, fibromyalgia, episodic vertigo, dyspareunia here for depression/grief follow up  Depression  MDD  Grief:  Patient here today to follow up depression/anxiety/grief in setting of losing two close family members.  Currently on Effexor and Bupropion with Hydroxyzine qHS for sleep. Stopped taking the Bupropion due to headaches.  She is also following with Dr. Hartford Poli and finding the coping mechanisms very helpful. Sleep is improved with Hydroxyzine but some increase pain from her fibromyalgia is making it difficult to stay asleep. Denies SI/HI PHQ-9 score: 5  Fibromyalgia Pain: Patient notes that her fibromyalgia has flared up.  She endorses shooting pain from her right shoulder down her arm and pain that shoots down her left leg.  She notes that this feels like a typical fibromyalgia flareup.  She notes that sometimes symptoms worsen when she exercises which is probably what is happened.  She notes that this makes it very difficult for her to sleep and she is waking up in the melanite due to the pain.  Denies any new trauma or falls. Describes the pain as a "tooth ache" that seems to be "bothering her more and not easing up". Baseline discomfort scale around 5-6. Currently discomfort around 5-6 during the day and 7 at night.   Review of Systems:  Per HPI.   Objective:   BP 118/76   Pulse 72   Ht 5' 4"  (1.626 m)   Wt 203 lb 12.8 oz (92.4 kg)   SpO2 96%   BMI 34.98 kg/m  Vitals and nursing note reviewed.  General: pleasant older female, sitting comfortably in exam chair, well nourished, well developed, in no acute distress with non-toxic appearance Resp: breathing comfortably on room air, speaking in full sentences Skin: warm, dry Extremities: warm  and well perfused MSK: see below Neuro: Alert and oriented, speech normal  Left Hip:  - Inspection: No gross deformity, no swelling, erythema, or ecchymosis - Palpation: TTP along greater trochanter, some tenderness along piriformis muscle and lumbar spine/paraspinal muscles - ROM: Normal range of motion on Flexion, extension, abduction, internal and external rotation - Strength: Normal strength. - Special Tests: Negative FABER and FADIR. Negative SLR  Lumbar spine: - Inspection: no gross deformity or asymmetry, swelling or ecchymosis - Palpation: some TTP along lumbar spinous processes and paraspinal muscles - ROM: full active ROM of the lumbar spine in flexion and extension without pain - Special testing: Negative straight leg raise, Negative FABER  Left Shoulder: Inspection reveals no obvious deformity, atrophy, or asymmetry. No bruising. No swelling No cervical spine tenderness Normal ROM with only mild tenderness   Depression screen [facility] 2/9 06/04/2020 05/23/2020 05/02/2020  Decreased Interest 0 2 2  Down, Depressed, Hopeless 0 1 1  PHQ - 2 Score 0 3 3  Altered sleeping 2 0 1  Tired, decreased energy 2 0 2  Change in appetite 0 2 1  Feeling bad or failure about yourself  0 2 2  Trouble concentrating 1 1 1   Moving slowly or fidgety/restless 0 2 2  Suicidal thoughts 0 0 0  PHQ-9 Score 5 10 12   Difficult doing work/chores Not difficult at all Somewhat difficult Somewhat difficult  Some recent data might be hidden   Prior Imaging:   Cervical Spine: 07/2016  FINDINGS: There is no evidence of fracture or subluxation. Vertebral bodies demonstrate normal height and alignment. Intervertebral disc spaces are preserved. Prevertebral soft tissues are within normal limits. The provided odontoid view demonstrates no significant abnormality. Anterior disc osteophyte complexes are noted at the mid cervical spine.  The visualized lung apices are clear.  IMPRESSION: No evidence of  fracture or subluxation along the cervical spine.  Right Shoulder: 07/2016 FINDINGS: There is no evidence of fracture or dislocation. The right humeral head is seated within the glenoid fossa. Mild degenerative change is noted about the right acromioclavicular joint. No significant soft tissue abnormalities are seen. The visualized portions of the right lung are clear.  IMPRESSION: No evidence of fracture or dislocation.  Left Hip 07/2012: Findings: No significant hip joint degenerative changes. No plain  film evidence of femoral head avascular necrosis. No fracture or  dislocation. No bony destructive lesion.   Minimal degenerative changes pubic symphysis.   IMPRESSION:  Negative plain film exam of the left hip.   Assessment & Plan:   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 prior episode (HCC) Chronic, improving. PHQ-9 score down from 10 to 5 today. Denis SI/HI. Has stopped Wellbutrin due to intolerance. Continues to benefit from counseling. Discussed management options including continuing current regimen with focus on improving sleep/pain vs increasing Effexor to 171m BID. Patient opted for the former with plan to follow up if no improvement in her pain or worsening mood. - continue Effexor 754mBID - hold Hydroxyzine given starting Flexeril - continue therapy with Dr. LeHartford Polis scheduled - Follow up as needed  Fibromyalgia Acute on chronic. Patient endorses pain similar to acute fibromyalgia flair, primarily at night which is affecting her sleep. MSK exam with no acute findings, reassuring no new trauma. Patient has similar pain in past with prior imaging that was negative. Possible greater trochanteric bursitis that could be contributing. Discussed management options. Opted to treat conservatively with augmentation with Flexeril 2.5-76m82mHS for pain with continuation of Effexor as prescribed. Could consider addition of Lyrica if  continues to bother her. She has been on this regimen before with improvement in symptoms. Goal would be to improve nocturnal discomfort scale closer to baseline level. Recommended follow up if no improvement for more thorough evaluation +/- greater trochanteric bursa injection. Patient voiced understanding and agreement with plan.  No orders of the defined types were placed in this encounter.  Meds ordered this encounter  Medications  . cyclobenzaprine (FLEXERIL) 5 MG tablet    Sig: Take 0.5-1 tablets (2.5-5 mg total) by mouth at bedtime.    Dispense:  30 tablet    Ref[place]:  1  [place]3, [facility] 06/04/2020 11:37 AM

## 2020-06-04 ENCOUNTER — Encounter: Payer: Self-pay | Admitting: Family Medicine

## 2020-06-04 ENCOUNTER — Other Ambulatory Visit: Payer: Self-pay

## 2020-06-04 ENCOUNTER — Ambulatory Visit: Payer: Medicaid Other | Admitting: Family Medicine

## 2020-06-04 VITALS — BP 118/76 | HR 72 | Ht 64.0 in | Wt 203.8 lb

## 2020-06-04 DIAGNOSIS — F322 Major depressive disorder, single episode, severe without psychotic features: Secondary | ICD-10-CM | POA: Diagnosis not present

## 2020-06-04 DIAGNOSIS — M797 Fibromyalgia: Secondary | ICD-10-CM

## 2020-06-04 MED ORDER — CYCLOBENZAPRINE HCL 5 MG PO TABS: 2.5000 mg | ORAL_TABLET | Freq: Every day | ORAL | 1 refills | Status: AC

## 2020-06-04 NOTE — Patient Instructions (Signed)
It was a pleasure to see you today!  Thank you for choosing [facility] for your primary care.   Our plans for today were:  Depression/Grief: Continue the Effexor 61m twice a day   Follow up in 1 month or sooner if mood worsens or symptoms are improving alone with Effexor. We can talk about going up on the medicine.  Fibromyalgia: Start Flexeril 2.5-563mat night to help with pain and sleep.  Follow up in 2-3 weeks if pain scale is not improving from 7  We will further evaluate    BRING ALL OF YOUR MEDICATIONS WITH YOU TO EVERY VISIT  Best Wishes,   KiMina MarbleDO

## 2020-06-04 NOTE — Assessment & Plan Note (Addendum)
Chronic, improving. PHQ-9 score down from 10 to 5 today. Denis SI/HI. Has stopped Wellbutrin due to intolerance. Continues to benefit from counseling. Discussed management options including continuing current regimen with focus on improving sleep/pain vs increasing Effexor to 187m BID. Patient opted for the former with plan to follow up if no improvement in her pain or worsening mood. - continue Effexor 742mBID - hold Hydroxyzine given starting Flexeril - continue therapy with Dr. LeHartford Polis scheduled - Follow up as needed

## 2020-06-04 NOTE — Assessment & Plan Note (Addendum)
Acute on chronic. Patient endorses pain similar to acute fibromyalgia flair, primarily at night which is affecting her sleep. MSK exam with no acute findings, reassuring no new trauma. Patient has similar pain in past with prior imaging that was negative. Possible greater trochanteric bursitis that could be contributing. Discussed management options. Opted to treat conservatively with augmentation with Flexeril 2.5-79m qHS for pain with continuation of Effexor as prescribed. Could consider addition of Lyrica if continues to bother her. She has been on this regimen before with improvement in symptoms. Goal would be to improve nocturnal discomfort scale closer to baseline level. Recommended follow up if no improvement for more thorough evaluation +/- greater trochanteric bursa injection. Patient voiced understanding and agreement with plan.

## 2020-06-06 ENCOUNTER — Other Ambulatory Visit: Payer: Self-pay

## 2020-06-06 ENCOUNTER — Ambulatory Visit (INDEPENDENT_AMBULATORY_CARE_PROVIDER_SITE_OTHER): Payer: Medicaid Other | Admitting: Psychology

## 2020-06-06 DIAGNOSIS — F4321 Adjustment disorder with depressed mood: Secondary | ICD-10-CM

## 2020-06-06 NOTE — BH Specialist Note (Signed)
Integrated Behavioral Health Follow Up In-Person Visit  MRN: [id number] Name: Amanda Dickerson  Number of [place] Clinician visits: 6/6 Session Start time: 9  Session End time: 945 Total time: 45  minutes  Types of Service: Individual psychotherapy    Subjective: MALEYAH Dickerson is a 60 y.o. female  Patient was referred by Dr. Tarry Kos for grief. Patient reports the following symptoms/concerns: Pt reported today she suffered another loss, her cousin. 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 Discussed how loss is triggering considering the other recent losses.   Processing how to "slow down" and honor emotions around loss.   Duration of problem: past 51month; Severity of problem: mild  Objective: Mood: Euthymic and Affect: Appropriate Risk of harm to self or others: No plan to harm self or others  Life Context: Family and Social:lost mother and son's father recently School/Work:on disability   Patient and/or Family's Strengths/Protective Factors: Social and Emotional competence  Goals Addressed: Patient will: 1. Reduce symptoms oTY:OMAYO feelings of worry, feeling overwhelmed, down and depressed 2. Increase knowledge and/or ability oKH:TXHF-SFSELTRVUYskills: finding ways to process emotions 3. Demonstrate ability tEB:XIDHWhealthy grieving over loss  Progress towards Goals: Ongoing  Interventions: Interventions utilized:Supportive Reflection and emotion processing Standardized Assessments completed:n/a  Patient and/or Family Response:Pt engaged in treatment planning  Patient Centered Plan: Patient is on the following Treatment Plan(s):Grief processing treatment plan  Assessment: Patient currently experiencinganxiety and depression sx due to the loss of two close family members.  Patient may benefit fromemotional processing and self management strategies  Plan: 1. Follow up  with behavioral health clinician on :2weeks; CCA to be completed next visit 2. Behavioral recommendations:emotion processing 3. [place])  RErlinda Hong PhD., LMFT-A

## 2020-06-15 DIAGNOSIS — H524 Presbyopia: Secondary | ICD-10-CM | POA: Diagnosis not present

## 2020-06-15 DIAGNOSIS — H5203 Hypermetropia, bilateral: Secondary | ICD-10-CM | POA: Diagnosis not present

## 2020-06-15 DIAGNOSIS — H52223 Regular astigmatism, bilateral: Secondary | ICD-10-CM | POA: Diagnosis not present

## 2020-06-15 DIAGNOSIS — H5213 Myopia, bilateral: Secondary | ICD-10-CM | POA: Diagnosis not present

## 2020-06-19 ENCOUNTER — Ambulatory Visit (INDEPENDENT_AMBULATORY_CARE_PROVIDER_SITE_OTHER): Payer: Medicaid Other | Admitting: Psychology

## 2020-06-19 ENCOUNTER — Other Ambulatory Visit: Payer: Self-pay

## 2020-06-19 DIAGNOSIS — F4321 Adjustment disorder with depressed mood: Secondary | ICD-10-CM | POA: Diagnosis not present

## 2020-06-19 NOTE — BH Specialist Note (Signed)
ADULT Comprehensive Clinical Assessment (CCA) Note   06/19/2020 Amanda Dickerson [id number]   Referring Provider:Dr. Tarry Kos Session Time:  0900 - 0945 45  minutes.  SUBJECTIVE: Amanda Dickerson is a 60 y.o.   female   Amanda Dickerson was seen in consultation at the request of Amanda Hefty, DO for evaluation of grief.  Types of Service: Comprehensive Clinical Assessment (CCA)  Reason for referral in patient/family's own words:  Working on grief and emotions around grief     She likes to be called Amanda Dickerson.    Primary language at home is [place].  Constitutional Appearance: cooperative, well-nourished, well-developed, alert and well-appearing  (Patient to answer as appropriate) Gender identity: Female Sex assigned at birth: female Pronouns: she   Mental status exam:   General Appearance Amanda Dickerson:  Neat Eye Contact:  Good Motor Behavior:  Normal Speech:  Normal Level of Consciousness:  Alert Mood:  Euthymic Affect:  Appropriate Anxiety Level:  Minimal Thought Process:  Coherent Thought Content:  WNL Perception:  Normal Judgment:  Good Insight:  Present   Current Medications and therapies: She is taking:   Outpatient Encounter Medications as of 06/19/2020  Medication Sig  . albuterol (VENTOLIN HFA) 108 (90 Base) MCG/ACT inhaler INHALE 2 PUFFS EVERY 6 HOURS AS NEEDED FOR WHEEZE/SHORTNESS OF BREATH  . conjugated estrogens (PREMARIN) vaginal cream Apply to vaginal area daily for 2 weeks, then reduce to twice a week  . cyclobenzaprine (FLEXERIL) 5 MG tablet Take 0.5-1 tablets (2.5-5 mg total) by mouth at bedtime.  . hydrOXYzine (VISTARIL) 25 MG capsule TAKE 1-2 CAPSULES (25-50 MG TOTAL) BY MOUTH 4 (FOUR) TIMES DAILY AS NEEDED FOR ANXIETY (INSOMNIA).  Marland Kitchen mesalamine (LIALDA) 1.2 g EC tablet Take 2 tablets (2.4 g total) by mouth in the morning and at bedtime. Generic ok  . mometasone-formoterol (DULERA) 100-5 MCG/ACT AERO Inhale 2 puffs into the lungs as needed for  wheezing or shortness of breath.  Marland Kitchen omeprazole (PRILOSEC) 20 MG capsule TAKE 1 CAPSULE BY MOUTH EVERY DAY  . polyethylene glycol powder (GLYCOLAX/MIRALAX) powder Mix 17 grams in 8 oz of water 1-2 x daily  . venlafaxine (EFFEXOR) 75 MG tablet Take 1 tablet (75 mg total) by mouth 2 (two) times daily.   No facility-administered encounter medications on file as of 06/19/2020.     Therapies:  Behavioral therapy and previous PT for chronic pain  Family history: Family mental illness:  hx of depression and anxiety Family school achievement history:  12th grade Other relevant family history:  n/a  Social History: Now living with alone.  Employment:  on disability Religious or Spiritual Beliefs: identifies as Christian   Mood: She generally happy, experiences anxiety and depression sx regarding grief.   Negative Mood Concerns She makes negative statements about self regarding how she could have helped her  Self-injury:  No Suicidal ideation:  No Suicide attempt:  No  Additional Anxiety Concerns: Panic attacks:  No Obsessions:  No Compulsions:  No  Stressors:  Family death, Grief/losses and Recent diagnosis of chronic illness or psychiatric disorder  Alcohol and/or Substance Use: Have you recently consumed alcohol?  rare drinker  Have you recently used any drugs?  no  Have you recently consumed any tobacco? no Does patient seem concerned about dependence or abuse of any substance? no  Substance Use Disorder Checklist:  n/a  Severity Risk Scoring based on DSM-5 Criteria for Substance Use Disorder. The presence of at least two (2) criteria in the last 12 months indicate  a substance use disorder. The severity of the substance use disorder is defined as:  Mild: Presence of 2-3 criteria Moderate: Presence of 4-5 criteria Severe: Presence of 6 or more criteria  Traumatic Experiences: History or current traumatic events (natural disaster, house fire, etc.)? no History or  current physical trauma?  no History or current emotional trauma?  Yes: past relationships  History or current sexual trauma?  no History or current domestic or intimate partner violence?  no History of bullying:  no  Risk Assessment: Suicidal or homicidal thoughts?   no Self injurious behaviors?  no Guns in the home?  no  Self Harm Risk Factors: Chronic pain and Loss (financial/interpersonal/professional)  Self Harm Thoughts?: No  Patient and/or Family's Strengths/Protective Factors: Social and Emotional competence  Patient's and/or Family's Goals in their own words: Pt would like to process emotions around grief that cause her to experience depression sx  Interventions: Interventions utilized:  Supportive Counseling and Supportive Reflection   Patient and/or Family Response: Pt engaged in treatment planning   Standardized Assessments completed: PHQ 9  Patient Centered Plan: Patient is on the following Treatment Plan(s):  Grief treatment plan   Coordination of Care: Written progress or summary reports illustrate treatment and treatment plans  DSM-5 Diagnosis: grief  Recommendations for Services/Supports/Treatments: Supportive counseling, emotion processing   Progress towards Goals: Ongoing  Treatment Plan Summary: Behavioral Health Clinician will: Assess individual's status and evaluate for psychiatric symptoms  Individual will: Utilize coping skills taught in therapy to reduce symptoms  Referral(s): [place] (In Clinic)  Erlinda Hong, PhD., LMFT-A

## 2020-06-20 ENCOUNTER — Other Ambulatory Visit: Payer: Self-pay | Admitting: Gastroenterology

## 2020-06-21 ENCOUNTER — Other Ambulatory Visit: Payer: Self-pay

## 2020-06-21 DIAGNOSIS — R232 Flushing: Secondary | ICD-10-CM

## 2020-06-21 MED ORDER — VENLAFAXINE HCL 75 MG PO TABS: 75.0000 mg | ORAL_TABLET | Freq: Two times a day (BID) | ORAL | 2 refills | Status: DC

## 2020-07-10 ENCOUNTER — Other Ambulatory Visit: Payer: Self-pay

## 2020-07-10 ENCOUNTER — Ambulatory Visit (INDEPENDENT_AMBULATORY_CARE_PROVIDER_SITE_OTHER): Payer: Medicaid Other | Admitting: Psychology

## 2020-07-10 DIAGNOSIS — F4321 Adjustment disorder with depressed mood: Secondary | ICD-10-CM

## 2020-07-10 NOTE — BH Specialist Note (Signed)
Integrated Behavioral Health Follow Up In-Person Visit  MRN: [id number] Name: Amanda Dickerson  Number of [place] Clinician visits: 8 Session Start time: 900  Session End time: 945 Total time: 45  minutes  Types of Service: Individual psychotherapy   Subjective: CALIANA SPIRES is a 60 y.o. female  Patient was referred by Dr. Tarry Kos for grief. Patient reports the following symptoms/concerns: Pt reported recognizing triggers regarding her grief.  Reported recent incident of her mother's marker not being ready and sent her into thoughts that "she was not doing enough" for her mom.  Discussed previous childhood experience and relationship with mom.  Pt shared she took on the "parent role". Discussed how this plays into other relationships.      Duration of problem:past 93month; Severity of problem:mild  Objective: Mood:Euthymicand Affect: Appropriate Risk of harm to self or others:No plan to harm self or others  Life Context: Family and Social:lost mother and son's father recently School/Work:on disability   Patient and/or Family's Strengths/Protective Factors: Social and Emotional competence  Goals Addressed: Patient will: 1. Reduce symptoms oMK:JIZXY feelings of worry, feeling overwhelmed, down and depressed 2. Increase knowledge and/or ability oOF:VWAQ-LRJPVGKKDPskills: finding ways to process emotions; challenging "care-giver role" as relates to grief  3. Demonstrate ability tTE:LMRAJhealthy grieving over loss  Progress towards Goals: Ongoing  Interventions: Interventions utilized:Supportive Reflection and emotion processing; family systems work Standardized Assessments completed:n/a  Patient and/or Family Response:Pt engaged in treatment planning  Patient Centered Plan: Patient is on the following Treatment Plan(s):Grief processing treatment plan  Assessment: Patient currently experiencinganxiety and depression  sx due to the loss of two close family members.  Patient may benefit fromemotional processing and self management strategies  Plan: 1. Follow up with behavioral health clinician on :2weeks 2. Behavioral recommendations:emotion processing; recognizing role in relationships 3. Referral(s[place])  RErlinda Hong PhD., LMFT-A

## 2020-07-20 ENCOUNTER — Other Ambulatory Visit: Payer: Self-pay | Admitting: Gastroenterology

## 2020-07-20 ENCOUNTER — Other Ambulatory Visit: Payer: Self-pay

## 2020-07-20 MED ORDER — LIALDA 1.2 G PO TBEC: DELAYED_RELEASE_TABLET | ORAL | 0 refills | Status: DC

## 2020-07-24 ENCOUNTER — Ambulatory Visit (INDEPENDENT_AMBULATORY_CARE_PROVIDER_SITE_OTHER): Payer: Medicaid Other | Admitting: Psychology

## 2020-07-24 ENCOUNTER — Other Ambulatory Visit: Payer: Self-pay

## 2020-07-24 DIAGNOSIS — F4321 Adjustment disorder with depressed mood: Secondary | ICD-10-CM

## 2020-07-24 NOTE — BH Specialist Note (Signed)
Integrated Behavioral Health Follow Up In-Person Visit  MRN: [id number] Name: Amanda Dickerson  Number of [place] Clinician visits: 9 Session Start time: 951  Session End time: 884 Total time: 30 minutes  Types of Service: Individual psychotherapy   Subjective: Amanda Dickerson is a 60 y.o. female  Patient was referred by Dr. Tarry Kos for grief. Patient reports the following symptoms/concerns: Pt shared experience where she created boundaries of her role in the family.  Processed emotions around her role and how she relates to others in her family.    Duration of problem:past 17month; Severity of problem:mild  Objective: Mood:Euthymicand Affect: Appropriate Risk of harm to self or others:No plan to harm self or others  Life Context: Family and Social:lost mother and son's father recently School/Work:on disability   Patient and/or Family's Strengths/Protective Factors: Social and Emotional competence  Goals Addressed: Patient will: 1. Reduce symptoms oZY:SAYTK feelings of worry, feeling overwhelmed, down and depressed 2. Increase knowledge and/or ability oZS:WFUX-NATFTDDUKGskills: finding ways to process emotions; challenging "care-giver role" as relates to grief  3. Demonstrate ability tUR:KYHCWhealthy grieving over loss  Progress towards Goals: Ongoing  Interventions: Interventions utilized:Supportive Reflectionand emotion processing; family systems work Standardized Assessments completed:n/a  Patient and/or Family Response:Pt engaged in treatment planning  Patient Centered Plan: Patient is on the following Treatment Plan(s):Grief processing treatment plan  Assessment: Patient currently experiencinganxiety and depression sx due to the loss of two close family members.  Patient may benefit fromemotional processing and self management strategies  Plan: 1. Follow up with behavioral health clinician on  :2weeks 2. Behavioral recommendations:emotion processing; recognizing role in relationships 3. Referral(s[place])  RErlinda Hong PhD., LMFT-A

## 2020-08-05 NOTE — Progress Notes (Deleted)
Cardiology Office Note:    Date:  08/05/2020   ID:  Amanda Dickerson, DOB November 20, 1960, MRN [id number]  PCP:  Danna Hefty, DO  Cardiologist:  None  Electrophysiologist:  None   Referring MD: Danna Hefty, DO   No chief complaint on file.   History of Present Illness:    Amanda Dickerson is a 60 y.o. 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  She was referred by Dr. Erin Hearing for evaluation of irregular heartbeat, initially seen on 06/15/2019.  She reports that she started having palpitations in the beginning of March.  Happens every day, can go on for hours.  Does not feel her heart is racing, but feels like heart beat is irregular.  Reports not having any palpitations this morning but had all day yesterday.  No smoking history.  Does not drink coffee, but will have occasional caffeinated soda.  Rare alcohol use.  Family history includes mother has atrial fibrillation and congestive heart failure.  Exercise Myoview 09/2014 showed no ischemia or infarct, EF 71%.  TTE 05/31/2019 showed normal biventricular function without evidence of significant valvular disease.  Labs on 05/27/2019 showed normal potassium, normal TSH.  Zio patch x3 days showed occasional PACs (2.3% of beats), with symptoms appearing to correspond to PACs.  Since last clinic visit,   she reports that she has been doing well.  She has had only a few episodes of palpitations over the last 2 months.  Lasted few seconds and resolved.  She denies any chest pain.  Reports she has baseline dyspnea from asthma.  She has not been exercising.   Past Medical History:  Diagnosis Date  . Anemia   . Anxiety   . Arthritis    KNEES  . Asthma   . Colitis   . Dysfunctional uterine bleeding   . Fibromyalgia   . GERD (gastroesophageal reflux disease)   . Headache(784.0)   . History of cardiovascular stress test     ETT-Myoview 7/16:  EF 71%, no ischemia or infarct; Low Risk    Past Surgical History:  Procedure Laterality Date  . CESAREAN SECTION  1988  . COLONOSCOPY    . ENDOMETRIAL ABLATION W/ NOVASURE  08/2011  . MULTIPLE TOOTH EXTRACTIONS    . TUBAL LIGATION  2000    Current Medications: No outpatient medications have been marked as taking for the 08/06/20 encounter (Appointment) with Donato Heinz, MD.     Allergies:   Ibuprofen and Penicillins   Social History   Socioeconomic History  . Marital status: Divorced    Spouse name: Not on file  . Number of children: 1  . Years of education: 36  . Highest education level: Not on file  Occupational History  . Occupation: Disabled  Tobacco Use  . Smoking status: Never Smoker  . Smokeless tobacco: Never Used  Vaping Use  . Vaping Use: Never used  Substance and Sexual Activity  . Alcohol use: Yes    Comment: occasionally  . Drug use: No  . Sexual activity: Yes    Birth control/protection: Surgical  Other Topics Concern  . Not on file  Social History Narrative   Lives alone   Caffeine - rarely   Social Determinants of Health   Financial Resource Strain: Not on file  Food Insecurity: Not on file  Transportation Needs: Not on file  Physical Activity: Not on file  Stress: Not on file  Social Connections: Not on file     Family History: The patient's family history includes Colon polyps in her mother and sister; Diabetes in her father; Heart disease in her mother; Hyperlipidemia in her mother; Hypertension in her mother; Other in her mother; Prostate cancer in her father. There is no history of Esophageal cancer, Colon cancer, Stomach cancer, or Rectal cancer.  ROS:   Please see the history of present illness.     All other systems reviewed and are negative.  EKGs/Labs/Other Studies Reviewed:    The following studies were reviewed today:   EKG:  EKG is  ordered today.  The ekg ordered today demonstrates normal  sinus rhythm, rate 56, no ST/T abnormalities  TTE 05/31/19: 1. Left ventricular ejection fraction, by estimation, is 65 to 70%. The  left ventricle has normal function. The left ventricle has no regional  wall motion abnormalities. Left ventricular diastolic parameters were  normal.  2. Right ventricular systolic function is normal. The right ventricular  size is normal. There is mildly elevated pulmonary artery systolic  pressure.  3. The mitral valve is normal in structure. Trivial mitral valve  regurgitation. No evidence of mitral stenosis.  4. The aortic valve is tricuspid. Aortic valve regurgitation is not  visualized. No aortic stenosis is present.   Conclusion(s)/Recommendation(s): Normal biventricular function without  evidence of hemodynamically significant valvular heart disease.   Lexiscan Myoview 09/21/2014:  The left ventricular ejection fraction is hyperdynamic (>65%).  Nuclear stress EF: 71%.  There was no ST segment deviation noted during stress.  The study is normal.  This is a low risk study.   Normal study with no evidence of infarct or ischemia.     Recent Labs: 08/15/2019: ALT 32  Recent Lipid Panel    Component Value Date/Time   CHOL 195 10/20/2018 1013   TRIG 131 10/20/2018 1013   HDL 71 10/20/2018 1013   CHOLHDL 2.7 10/20/2018 1013   CHOLHDL 3.4 07/26/2015 0918   VLDL 28 07/26/2015 0918   LDLCALC 98 10/20/2018 1013    Physical Exam:    VS:  There were no vitals taken for this visit.    Wt Readings from Last 3 Encounters:  06/04/20 203 lb 12.8 oz (92.4 kg)  04/30/20 203 lb (92.1 kg)  04/13/20 198 lb 12.8 oz (90.2 kg)     GEN:  Well nourished, well developed in no acute distress HEENT: Normal NECK: No JVD; No carotid bruits LYMPHATICS: No lymphadenopathy CARDIAC: irregular, normal rate, no murmurs, rubs, gallops RESPIRATORY:  Clear to auscultation without rales, wheezing or rhonchi  ABDOMEN: Soft, non-tender,  non-distended MUSCULOSKELETAL:  No edema; No deformity  SKIN: Warm and dry NEUROLOGIC:  Alert and oriented x 3 PSYCHIATRIC:  Normal affect   ASSESSMENT:    No diagnosis found. PLAN:    Chest pain: Atypical in description but does report can occur with exertion.  Recommend coronary CTA for further evaluation.  We will give Lopressor 50 mg prior to study.  Palpitations: Symptoms corresponded to PACs and Zio patch.  PACs represented 2.3% of beats on monitor.  Normal thyroid function.  TTE on 06/01/2019 showed normal systolic function.  Given PACs are benign and patient reports improvement in symptoms recently with palpitations occurring infrequently, no indication for treatment at this time  Prediabetes: A1c 6.1  Snoring: Will check sleep study  RTC in 1 year   Medication Adjustments/Labs and Tests Ordered: Current medicines are reviewed at length with the patient today.  Concerns regarding medicines  are outlined above.  No orders of the defined types were placed in this encounter.  No orders of the defined types were placed in this encounter.   There are no Patient Instructions on file for this visit.   Signed, Donato Heinz, MD  08/05/2020 9:33 PM    [place]

## 2020-08-06 ENCOUNTER — Ambulatory Visit (INDEPENDENT_AMBULATORY_CARE_PROVIDER_SITE_OTHER): Payer: Medicaid Other | Admitting: Cardiology

## 2020-08-06 ENCOUNTER — Encounter: Payer: Self-pay | Admitting: Cardiology

## 2020-08-06 ENCOUNTER — Other Ambulatory Visit: Payer: Self-pay

## 2020-08-06 VITALS — BP 127/76 | HR 65 | Ht 65.0 in | Wt 203.0 lb

## 2020-08-06 DIAGNOSIS — Z13220 Encounter for screening for lipoid disorders: Secondary | ICD-10-CM

## 2020-08-06 DIAGNOSIS — R0683 Snoring: Secondary | ICD-10-CM

## 2020-08-06 DIAGNOSIS — R002 Palpitations: Secondary | ICD-10-CM

## 2020-08-06 DIAGNOSIS — R079 Chest pain, unspecified: Secondary | ICD-10-CM | POA: Diagnosis not present

## 2020-08-06 MED ORDER — METOPROLOL TARTRATE 50 MG PO TABS: ORAL_TABLET | ORAL | 0 refills | Status: DC

## 2020-08-06 NOTE — Progress Notes (Signed)
Cardiology Office Note:    Date:  08/06/2020   ID:  Amanda Dickerson, DOB Jan 05, 1961, MRN [id number]  PCP:  Danna Hefty, DO  Cardiologist:  None  Electrophysiologist:  None   Referring MD: Danna Hefty, DO   Chief Complaint  Patient presents with  . Chest Pain    History of Present Illness:    Amanda Dickerson is a 60 y.o. 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  She was referred by Dr. Erin Hearing for evaluation of irregular heartbeat, initially seen on 06/15/2019.  She reports that she started having palpitations in the beginning of March.  Happens every day, can go on for hours.  Does not feel her heart is racing, but feels like heart beat is irregular.  Reports not having any palpitations this morning but had all day yesterday.  No smoking history.  Does not drink coffee, but will have occasional caffeinated soda.  Rare alcohol use.  Family history includes mother has atrial fibrillation and congestive heart failure.  Exercise Myoview 09/2014 showed no ischemia or infarct, EF 71%.  TTE 05/31/2019 showed normal biventricular function without evidence of significant valvular disease.  Labs on 05/27/2019 showed normal potassium, normal TSH.  Zio patch x3 days showed occasional PACs (2.3% of beats), with symptoms appearing to correspond to PACs.  Since last clinic visit, she reports her palpitations are now rarer, typically a couple times a month. The palpitations feel like a "drum roll" and last for a few seconds. Separately, she occasionally is lightheaded. Also, she now has some sharp chest pains in her left chest. She describes it as "her heart is hurting." The chest pain occurs a few times a month, and lasts for up to 5 minutes. There is no clear trigger and it may occur at rest or while exercising. For exercise, she tries to walk but is limited by some fatigue. She also has a  pedaling machine that she may use for 20 minutes. She endorses snoring, but has not been tested for sleep apnea. She denies any shortness of breath, headaches, or syncope. Also has no lower extremity edema, orthopnea or PND.    Past Medical History:  Diagnosis Date  . Anemia   . Anxiety   . Arthritis    KNEES  . Asthma   . Colitis   . Dysfunctional uterine bleeding   . Fibromyalgia   . GERD (gastroesophageal reflux disease)   . Headache(784.0)   . History of cardiovascular stress test    ETT-Myoview 7/16:  EF 71%, no ischemia or infarct; Low Risk    Past Surgical History:  Procedure Laterality Date  . CESAREAN SECTION  1988  . COLONOSCOPY    . ENDOMETRIAL ABLATION W/ NOVASURE  08/2011  . MULTIPLE TOOTH EXTRACTIONS    . TUBAL LIGATION  2000    Current Medications: Current Meds  Medication Sig  . albuterol (VENTOLIN HFA) 108 (90 Base) MCG/ACT inhaler INHALE 2 PUFFS EVERY 6 HOURS AS NEEDED FOR WHEEZE/SHORTNESS OF BREATH  . conjugated estrogens (PREMARIN) vaginal cream Apply to vaginal area daily for 2 weeks, then reduce to twice a week  . cyclobenzaprine (FLEXERIL) 5 MG tablet Take 0.5-1 tablets (2.5-5 mg total) by mouth at bedtime.  . hydrOXYzine (VISTARIL) 25 MG capsule TAKE 1-2 CAPSULES (25-50 MG TOTAL) BY MOUTH 4 (FOUR) TIMES DAILY AS NEEDED FOR ANXIETY (INSOMNIA).  Marland Kitchen LIALDA 1.2 g EC tablet TAKE 2 TABLETS (2.4  G TOTAL) BY MOUTH 2 (TWO) TIMES DAILY.  . metoprolol tartrate (LOPRESSOR) 50 MG tablet Take 50 mg (1 tablet) two hours prior to CT scan  . mometasone-formoterol (DULERA) 100-5 MCG/ACT AERO Inhale 2 puffs into the lungs as needed for wheezing or shortness of breath.  Marland Kitchen omeprazole (PRILOSEC) 20 MG capsule TAKE 1 CAPSULE BY MOUTH EVERY DAY  . polyethylene glycol powder (GLYCOLAX/MIRALAX) powder Mix 17 grams in 8 oz of water 1-2 x daily  . venlafaxine (EFFEXOR) 75 MG tablet Take 1 tablet (75 mg total) by mouth 2 (two) times daily.     Allergies:   Ibuprofen and  Penicillins   Social History   Socioeconomic History  . Marital status: Divorced    Spouse name: Not on file  . Number of children: 1  . Years of education: 21  . Highest education level: Not on file  Occupational History  . Occupation: Disabled  Tobacco Use  . Smoking status: Never Smoker  . Smokeless tobacco: Never Used  Vaping Use  . Vaping Use: Never used  Substance and Sexual Activity  . Alcohol use: Yes    Comment: occasionally  . Drug use: No  . Sexual activity: Yes    Birth control/protection: Surgical  Other Topics Concern  . Not on file  Social History Narrative   Lives alone   Caffeine - rarely   Social Determinants of Health   Financial Resource Strain: Not on file  Food Insecurity: Not on file  Transportation Needs: Not on file  Physical Activity: Not on file  Stress: Not on file  Social Connections: Not on file     Family History: The patient's family history includes Colon polyps in her mother and sister; Diabetes in her father; Heart disease in her mother; Hyperlipidemia in her mother; Hypertension in her mother; Other in her mother; Prostate cancer in her father. There is no history of Esophageal cancer, Colon cancer, Stomach cancer, or Rectal cancer.  ROS:   Please see the history of present illness.    (+) Left chest pain, sharp/ache (+) Palpitations (+) Lightheadedness (+) Fatigue (+) Snores All other systems reviewed and are negative.  EKGs/Labs/Other Studies Reviewed:    The following studies were reviewed today:   EKG:   08/06/2020: NSR, motion artifact, rate 65, no ST abnormalities 06/15/2019: normal sinus rhythm, rate 56, no ST/T abnormalities  3 day Zio Monitor 07/04/2019:  Occasional PACs (2.3% of beats). Patient triggered events appear to correspond to PACs 3 days of data recorded on Zio monitor. Patient had a min HR of 46 bpm, max HR of 154 bpm, and avg HR of 67 bpm. Predominant underlying rhythm was Sinus Rhythm. No VT,  atrial  fibrillation, high degree block, or pauses noted. Three runs of SVT, longest lasting 11 beats.  Isolated ventricular ectopy was rare (<1%).  Isolated atrail ectopy was occasional (2.3%). There were 24 triggered events, corresponding to sinus rhythm +/- PACs.   No significant arrhythmias detected.   TTE 05/31/19: 1. Left ventricular ejection fraction, by estimation, is 65 to 70%. The  left ventricle has normal function. The left ventricle has no regional  wall motion abnormalities. Left ventricular diastolic parameters were  normal.  2. Right ventricular systolic function is normal. The right ventricular  size is normal. There is mildly elevated pulmonary artery systolic  pressure.  3. The mitral valve is normal in structure. Trivial mitral valve  regurgitation. No evidence of mitral stenosis.  4. The aortic valve is tricuspid. Aortic  valve regurgitation is not  visualized. No aortic stenosis is present.   Conclusion(s)/Recommendation(s): Normal biventricular function without  evidence of hemodynamically significant valvular heart disease.   Lexiscan Myoview 09/21/2014:  The left ventricular ejection fraction is hyperdynamic (>65%).  Nuclear stress EF: 71%.  There was no ST segment deviation noted during stress.  The study is normal.  This is a low risk study.   Normal study with no evidence of infarct or ischemia.     Recent Labs: 08/15/2019: ALT 32  Recent Lipid Panel    Component Value Date/Time   CHOL 195 10/20/2018 1013   TRIG 131 10/20/2018 1013   HDL 71 10/20/2018 1013   CHOLHDL 2.7 10/20/2018 1013   CHOLHDL 3.4 07/26/2015 0918   VLDL 28 07/26/2015 0918   LDLCALC 98 10/20/2018 1013    Physical Exam:    VS:  BP 127/76   Pulse 65   Ht 5' 5"  (1.651 m)   Wt 203 lb (92.1 kg)   SpO2 98%   BMI 33.78 kg/m     Wt Readings from Last 3 Encounters:  08/06/20 203 lb (92.1 kg)  06/04/20 203 lb 12.8 oz (92.4 kg)  04/30/20 203 lb (92.1 kg)     GEN:  Well  nourished, well developed in no acute distress HEENT: Normal NECK: No JVD; No carotid bruits CARDIAC: RRR, no murmurs, rubs, gallops RESPIRATORY:  Clear to auscultation without rales, wheezing or rhonchi  ABDOMEN: Soft, non-tender, non-distended MUSCULOSKELETAL:  No edema; No deformity  SKIN: Warm and dry NEUROLOGIC:  Alert and oriented x 3 PSYCHIATRIC:  Normal affect   ASSESSMENT:    1. Chest pain of uncertain etiology   2. Palpitations   3. Snoring   4. Lipid screening    PLAN:    Chest pain: Atypical in description but does report can occur with exertion.  Recommend coronary CTA for further evaluation.  We will give Lopressor 50 mg prior to study.  Palpitations: Symptoms corresponded to PACs and Zio patch.  PACs represented 2.3% of beats on monitor.  Normal thyroid function.  TTE on 06/01/2019 showed normal systolic function.  Given PACs are benign and patient reports improvement in symptoms recently with palpitations occurring infrequently, no indication for treatment at this time  Prediabetes: A1c 6.1  Snoring: Will check sleep study  Lipid screening: will check lipid panel  RTC in 1 year    Medication Adjustments/Labs and Tests Ordered: Current medicines are reviewed at length with the patient today.  Concerns regarding medicines are outlined above.  Orders Placed This Encounter  Procedures  . CT CORONARY MORPH W/CTA COR W/SCORE W/CA W/CM &/OR WO/CM  . Basic metabolic panel  . Lipid panel  . EKG 12-Lead  . Home sleep test   Meds ordered this encounter  Medications  . metoprolol tartrate (LOPRESSOR) 50 MG tablet    Sig: Take 50 mg (1 tablet) two hours prior to CT scan    Dispense:  1 tablet    Refill:  0    Patient Instructions  Medication Instructions:  Your physician recommends that you continue on your current medications as directed. Please refer to the Current Medication list given to you today.  **Take metoprolol tartrate (Lopressor) 50 mg TWO hours  prior to CT scan**  *If you need a refill on your cardiac medications before your next appointment, please call your pharmacy*   Lab Work: BMET, Lipid prior to CT scan  If you have labs (blood work) drawn today and your  tests are completely normal, you will receive your results only by: Marland Kitchen MyChart Message (if you have MyChart) OR . A paper copy in the mail If you have any lab test that is abnormal or we need to change your treatment, we will call you to review the results.   Testing/Procedures: Coronary CTA-see instructions below  Your physician has recommended that you have a sleep study. This test records several body functions during sleep, including: brain activity, eye movement, oxygen and carbon dioxide blood levels, heart rate and rhythm, breathing rate and rhythm, the flow of air through your mouth and nose, snoring, body muscle movemen  Follow-Up: At [facility], you and your health needs are our priority.  As part of our continuing mission to provide you with exceptional heart care, we have created designated Provider Care Teams.  These Care Teams include your primary Cardiologist (physician) and Advanced Practice Providers (APPs -  Physician Assistants and Nurse Practitioners) who all work together to provide you with the care you need, when you need it.  We recommend signing up for the patient portal called "MyChart".  Sign up information is provided on this After Visit Summary.  MyChart is used to connect with patients for Virtual Visits (Telemedicine).  Patients are able to view lab/test results, encounter notes, upcoming appointments, etc.  Non-urgent messages can be sent to your provider as well.   To learn more about what you can do with MyChart, go to [email or web address].    Your next appointment:   3 month(s)  The format for your next appointment:   In Person  Provider:   Oswaldo Milian, MD  Coronary CTA instructions:  Your cardiac CT will be  scheduled at one of the below locations:   [facility] [address] [place], [place] [address] [phone]  If scheduled at [facility], please arrive at the [facility] main entrance (entrance A) of [facility] 30 minutes prior to test start time. Proceed to the [facility] Radiology Department (first floor) to check-in and test prep.  Please follow these instructions carefully (unless otherwise directed):  On the Night Before the Test: . Be sure to Drink plenty of water. . Do not consume any caffeinated/decaffeinated beverages or chocolate 12 hours prior to your test. . Do not take any antihistamines 12 hours prior to your test.  On the Day of the Test: . Drink plenty of water until 1 hour prior to the test. . Do not eat any food 4 hours prior to the test. . You may take your regular medications prior to the test.  . Take metoprolol (Lopressor) two hours prior to test. . FEMALES- please wear underwire-free bra if available      After the Test: . Drink plenty of water. . After receiving IV contrast, you may experience a mild flushed feeling. This is normal. . On occasion, you may experience a mild rash up to 24 hours after the test. This is not dangerous. If this occurs, you can take Benadryl 25 mg and increase your fluid intake. . If you experience trouble breathing, this can be serious. If it is severe call 911 IMMEDIATELY. If it is mild, please call our office. . If you take any of these medications: Glipizide/Metformin, Avandament, Glucavance, please do not take 48 hours after completing test unless otherwise instructed.   Once we have confirmed authorization from your insurance company, we will call you to set up a date and time for your test.  Based on how quickly your insurance processes prior authorizations requests, please allow up to 4 weeks to be contacted for scheduling your Cardiac CT appointment. Be advised that routine Cardiac CT  appointments could be scheduled as many as 8 weeks after your provider has ordered it.  For non-scheduling related questions, please contact the cardiac imaging nurse navigator should you have any questions/concerns: Marchia Bond, Cardiac Imaging Nurse Navigator Gordy Clement, Cardiac Imaging Nurse Navigator [facility] Direct Office Dial: [phone]   For scheduling needs, including cancellations and rescheduling, please call [place], [phone].       I,Mathew Stumpf,acting as a Education administrator for Donato Heinz, MD.,have documented all relevant documentation on the behalf of Donato Heinz, MD,as directed by  Donato Heinz, MD while in the presence of Donato Heinz, MD.  I, Donato Heinz, MD, have reviewed all documentation for this visit. The documentation on 08/06/20 for the exam, diagnosis, procedures, and orders are all accurate and complete.   Signed, Donato Heinz, MD  08/06/2020 5:11 PM    [facility]

## 2020-08-06 NOTE — Patient Instructions (Signed)
Medication Instructions:  Your physician recommends that you continue on your current medications as directed. Please refer to the Current Medication list given to you today.  **Take metoprolol tartrate (Lopressor) 50 mg TWO hours prior to CT scan**  *If you need a refill on your cardiac medications before your next appointment, please call your pharmacy*   Lab Work: BMET, Lipid prior to CT scan  If you have labs (blood work) drawn today and your tests are completely normal, you will receive your results only by: Marland Kitchen MyChart Message (if you have MyChart) OR . A paper copy in the mail If you have any lab test that is abnormal or we need to change your treatment, we will call you to review the results.   Testing/Procedures: Coronary CTA-see instructions below  Your physician has recommended that you have a sleep study. This test records several body functions during sleep, including: brain activity, eye movement, oxygen and carbon dioxide blood levels, heart rate and rhythm, breathing rate and rhythm, the flow of air through your mouth and nose, snoring, body muscle movemen  Follow-Up: At [facility], you and your health needs are our priority.  As part of our continuing mission to provide you with exceptional heart care, we have created designated Provider Care Teams.  These Care Teams include your primary Cardiologist (physician) and Advanced Practice Providers (APPs -  Physician Assistants and Nurse Practitioners) who all work together to provide you with the care you need, when you need it.  We recommend signing up for the patient portal called "MyChart".  Sign up information is provided on this After Visit Summary.  MyChart is used to connect with patients for Virtual Visits (Telemedicine).  Patients are able to view lab/test results, encounter notes, upcoming appointments, etc.  Non-urgent messages can be sent to your provider as well.   To learn more about what you can do with  MyChart, go to [email or web address].    Your next appointment:   3 month(s)  The format for your next appointment:   In Person  Provider:   Oswaldo Milian, MD  Coronary CTA instructions:  Your cardiac CT will be scheduled at one of the below locations:   [facility] [address] [place], [place] [address] [phone]  If scheduled at [facility] please arrive at the [facility] main entrance (entrance A) of [facility] 30 minutes prior to test start time. Proceed to the [facility] Radiology Department (first floor) to check-in and test prep.  Please follow these instructions carefully (unless otherwise directed):  On the Night Before the Test: . Be sure to Drink plenty of water. . Do not consume any caffeinated/decaffeinated beverages or chocolate 12 hours prior to your test. . Do not take any antihistamines 12 hours prior to your test.  On the Day of the Test: . Drink plenty of water until 1 hour prior to the test. . Do not eat any food 4 hours prior to the test. . You may take your regular medications prior to the test.  . Take metoprolol (Lopressor) two hours prior to test. . FEMALES- please wear underwire-free bra if available      After the Test: . Drink plenty of water. . After receiving IV contrast, you may experience a mild flushed feeling. This is normal. . On occasion, you may experience a mild rash up to 24 hours after the test. This is not dangerous. If this occurs, you can take Benadryl 25 mg  and increase your fluid intake. . If you experience trouble breathing, this can be serious. If it is severe call 911 IMMEDIATELY. If it is mild, please call our office. . If you take any of these medications: Glipizide/Metformin, Avandament, Glucavance, please do not take 48 hours after completing test unless otherwise instructed.   Once we have confirmed authorization from your insurance company, we will call you to set up a  date and time for your test. Based on how quickly your insurance processes prior authorizations requests, please allow up to 4 weeks to be contacted for scheduling your Cardiac CT appointment. Be advised that routine Cardiac CT appointments could be scheduled as many as 8 weeks after your provider has ordered it.  For non-scheduling related questions, please contact the cardiac imaging nurse navigator should you have any questions/concerns: Marchia Bond, Cardiac Imaging Nurse Navigator Gordy Clement, Cardiac Imaging Nurse Navigator [facility] Direct Office Dial: [phone]   For scheduling needs, including cancellations and rescheduling, please call [place], [phone].

## 2020-08-07 ENCOUNTER — Ambulatory Visit: Payer: Medicaid Other | Admitting: Psychology

## 2020-08-08 ENCOUNTER — Encounter: Payer: Self-pay | Admitting: Physician Assistant

## 2020-08-08 ENCOUNTER — Ambulatory Visit (INDEPENDENT_AMBULATORY_CARE_PROVIDER_SITE_OTHER): Payer: Medicaid Other | Admitting: Physician Assistant

## 2020-08-08 VITALS — BP 128/88 | HR 70 | Ht 65.0 in | Wt 204.2 lb

## 2020-08-08 DIAGNOSIS — R002 Palpitations: Secondary | ICD-10-CM | POA: Diagnosis not present

## 2020-08-08 DIAGNOSIS — K529 Noninfective gastroenteritis and colitis, unspecified: Secondary | ICD-10-CM

## 2020-08-08 DIAGNOSIS — K219 Gastro-esophageal reflux disease without esophagitis: Secondary | ICD-10-CM | POA: Diagnosis not present

## 2020-08-08 DIAGNOSIS — R079 Chest pain, unspecified: Secondary | ICD-10-CM | POA: Diagnosis not present

## 2020-08-08 DIAGNOSIS — Z13220 Encounter for screening for lipoid disorders: Secondary | ICD-10-CM | POA: Diagnosis not present

## 2020-08-08 MED ORDER — LIALDA 1.2 G PO TBEC: DELAYED_RELEASE_TABLET | ORAL | 0 refills | Status: DC

## 2020-08-08 MED ORDER — OMEPRAZOLE 40 MG PO CPDR: 40.0000 mg | DELAYED_RELEASE_CAPSULE | Freq: Every day | ORAL | 3 refills | Status: DC

## 2020-08-08 NOTE — Progress Notes (Signed)
Reviewed and agree with management plan.  Pricilla Riffle. Fuller Plan, MD FACG [phone]

## 2020-08-08 NOTE — Progress Notes (Signed)
Chief Complaint: Follow-up colitis and GERD  HPI:    Amanda Dickerson is a 60 year old female with a past medical history as listed below including fibromyalgia, reflux and colitis, known to Dr. Fuller Plan, who presents to clinic today for follow-up of her colitis and GERD.    02/17/2019 EGD with a medium hiatal hernia and erosive gastropathy.  Biopsies positive for H. pylori gastritis.    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  Prescribed treatment for H. pylori.    06/20/2019 patient seen in clinic by Carl Best, NP.  At that time following up for H. pylori and colitis.  At that time continued on omeprazole 20 mg p.o. daily and mesalamine 1.2 g 2 tabs p.o. twice daily.  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  She was continued on mesalamine 1.2 g 2 p.o. twice daily.  Repeat colonoscopy recommended December 2025.    Today, the patient presents to clinic and tells me that she is doing well she just figured "it has been a while since have been seen-follow-up".  Tells me her bowel movements are "good for me" and she is having no real GI concerns.  Does note that she has been slightly itchy with some backaches lately and read this may be a side effect from the Guys.  Also being worked up for some palpitations she is experiencing.    Also tells me that she has been having a lot of breakthrough symptoms of reflux.  Her PCP was talked about changing her medication but has not yet.  She continues on Omeprazole 20 mg daily.    Denies fever, chills, weight loss, diarrhea or blood in her stool  Past Medical History:  Diagnosis Date  . Anemia   . Anxiety   . Arthritis    KNEES  . Asthma   . Colitis   . Dysfunctional uterine bleeding   . Fibromyalgia   . GERD (gastroesophageal reflux disease)   . Headache(784.0)   . History of cardiovascular  stress test    ETT-Myoview 7/16:  EF 71%, no ischemia or infarct; Low Risk    Past Surgical History:  Procedure Laterality Date  . CESAREAN SECTION  1988  . COLONOSCOPY    . ENDOMETRIAL ABLATION W/ NOVASURE  08/2011  . MULTIPLE TOOTH EXTRACTIONS    . TUBAL LIGATION  2000    Current Outpatient Medications  Medication Sig Dispense Refill  . albuterol (VENTOLIN HFA) 108 (90 Base) MCG/ACT inhaler INHALE 2 PUFFS EVERY 6 HOURS AS NEEDED FOR WHEEZE/SHORTNESS OF BREATH 18 each 3  . conjugated estrogens (PREMARIN) vaginal cream Apply to vaginal area daily for 2 weeks, then reduce to twice a week 42.5 g 12  . cyclobenzaprine (FLEXERIL) 5 MG tablet Take 0.5-1 tablets (2.5-5 mg total) by mouth at bedtime. 30 tablet 1  . hydrOXYzine (VISTARIL) 25 MG capsule TAKE 1-2 CAPSULES (25-50 MG TOTAL) BY MOUTH 4 (FOUR) TIMES DAILY AS NEEDED FOR ANXIETY (INSOMNIA). 60 capsule 0  . LIALDA 1.2 g EC tablet TAKE 2 TABLETS (2.4 G TOTAL) BY MOUTH 2 (TWO) TIMES DAILY. 120 tablet 0  . metoprolol tartrate (LOPRESSOR) 50 MG tablet Take 50 mg (1 tablet) two hours prior to CT scan 1 tablet 0  . mometasone-formoterol (DULERA) 100-5 MCG/ACT AERO Inhale 2 puffs into the lungs as needed for wheezing or shortness of breath. 1 each 3  . omeprazole (  PRILOSEC) 20 MG capsule TAKE 1 CAPSULE BY MOUTH EVERY DAY 30 capsule 3  . polyethylene glycol powder (GLYCOLAX/MIRALAX) powder Mix 17 grams in 8 oz of water 1-2 x daily 255 g 5  . venlafaxine (EFFEXOR) 75 MG tablet Take 1 tablet (75 mg total) by mouth 2 (two) times daily. 60 tablet 2   No current facility-administered medications for this visit.    Allergies as of 08/08/2020 - Review Complete 08/06/2020  Allergen Reaction Noted  . Ibuprofen Other (See Comments) 10/12/2013  . Penicillins Nausea And Vomiting 05/14/2011    Family History  Problem Relation Age of Onset  . Hypertension Mother   . Heart disease Mother   . Hyperlipidemia Mother   . Other Mother        vertigo  .  Colon polyps Mother   . Diabetes Father   . Prostate cancer Father   . Colon polyps Sister   . Esophageal cancer Neg Hx   . Colon cancer Neg Hx   . Stomach cancer Neg Hx   . Rectal cancer Neg Hx     Social History   Socioeconomic History  . Marital status: Divorced    Spouse name: Not on file  . Number of children: 1  . Years of education: 51  . Highest education level: Not on file  Occupational History  . Occupation: Disabled  Tobacco Use  . Smoking status: Never Smoker  . Smokeless tobacco: Never Used  Vaping Use  . Vaping Use: Never used  Substance and Sexual Activity  . Alcohol use: Yes    Comment: occasionally  . Drug use: No  . Sexual activity: Yes    Birth control/protection: Surgical  Other Topics Concern  . Not on file  Social History Narrative   Lives alone   Caffeine - rarely   Social Determinants of Health   Financial Resource Strain: Not on file  Food Insecurity: Not on file  Transportation Needs: Not on file  Physical Activity: Not on file  Stress: Not on file  Social Connections: Not on file  Intimate Partner Violence: Not on file    Review of Systems:    Constitutional: No weight loss, fever or chills Cardiovascular: No chest pain  Respiratory: No SOB  Gastrointestinal: See HPI and otherwise negative   Physical Exam:  Vital signs: Ht 5' 5"  (1.651 m)   Wt 204 lb 3.2 oz (92.6 kg)   BMI 33.98 kg/m   Constitutional:   Pleasant Caucasian female appears to be in NAD, Well developed, Well nourished, alert and cooperative Head:  Normocephalic and atraumatic. Eyes:   PEERL, EOMI. No icterus. Conjunctiva pink. Ears:  Normal auditory acuity. Neck:  Supple Throat: Oral cavity and pharynx without inflammation, swelling or lesion.  Respiratory: Respirations even and unlabored. Lungs clear to auscultation bilaterally.   No wheezes, crackles, or rhonchi.  Cardiovascular: Normal S1, S2. No MRG. Regular rate and rhythm. No peripheral edema, cyanosis  or pallor.  Gastrointestinal:  Soft, nondistended, nontender. No rebound or guarding. Normal bowel sounds. No appreciable masses or hepatomegaly. Rectal:  Not performed.  Msk:  Symmetrical without gross deformities. Without edema, no deformity or joint abnormality.  Neurologic:  Alert and  oriented x4;  grossly normal neurologically.  Skin:   Dry and intact without significant lesions or rashes. Psychiatric: Oriented to person, place and time. Demonstrates good judgement and reason without abnormal affect or behaviors.  NO recent labs or imaging.  Assessment: 1.  Colitis: Thought most likely Crohn's, doing  well on Lialda 1.2 g tabs, 2 tabs twice daily 2.  GERD: Breakthrough symptoms on Omeprazole 20 mg daily  Plan: 1.  Increased Omeprazole to 40 mg daily, 30-60 minutes before breakfast.  Prescribed #30 with 11 refills 2.  Refilled Lialda 1.2 g tabs, 2 tabs twice daily #120 with 11 refills. 3.  Ordered CBC and CMP for medication monitoring. 4.  Patient to follow in clinic in a year with Dr. Fuller Plan or sooner if necessary.  Ellouise Newer, PA-C [facility] 08/08/2020, 1:20 PM  Cc: Danna Hefty, DO

## 2020-08-08 NOTE — Patient Instructions (Signed)
We have sent the following medications to your pharmacy for you to pick up at your convenience:Marland Kitchen Mesalamine 2.4 g twice daily Increase Omeprazole to 40 mg daily 30-60 minutes before breakfast.  Return for labs in next couple of week.   If you are age 58 or older, your body mass index should be between 23-30. Your Body mass index is 33.98 kg/m. If this is out of the aforementioned range listed, please consider follow up with your Primary Care Provider.  If you are age 40 or younger, your body mass index should be between 19-25. Your Body mass index is 33.98 kg/m. If this is out of the aformentioned range listed, please consider follow up with your Primary Care Provider.   __________________________________________________________  The [facility] providers would like to encourage you to use [facility] to communicate with providers for non-urgent requests or questions.  Due to long hold times on the telephone, sending your provider a message by [facility] may be a faster and more efficient way to get a response.  Please allow 48 business hours for a response.  Please remember that this is for non-urgent requests.

## 2020-08-09 LAB — LIPID PANEL
Chol/HDL Ratio: 4.1 ratio (ref 0.0–4.4)
Cholesterol, Total: 289 mg/dL — ABNORMAL HIGH (ref 100–199)
HDL: 70 mg/dL (ref >39)
LDL Chol Calc (NIH): 176 mg/dL — ABNORMAL HIGH (ref 0–99)
Triglycerides: 232 mg/dL — ABNORMAL HIGH (ref 0–149)
VLDL Cholesterol Cal: 43 mg/dL — ABNORMAL HIGH (ref 5–40)

## 2020-08-09 LAB — BASIC METABOLIC PANEL
BUN/Creatinine Ratio: 16 (ref 9–23)
BUN: 12 mg/dL (ref 6–24)
CO2: 24 mmol/L (ref 20–29)
Calcium: 9.4 mg/dL (ref 8.7–10.2)
Chloride: 104 mmol/L (ref 96–106)
Creatinine, Ser: 0.73 mg/dL (ref 0.57–1.00)
Glucose: 118 mg/dL — ABNORMAL HIGH (ref 65–99)
Potassium: 4.8 mmol/L (ref 3.5–5.2)
Sodium: 142 mmol/L (ref 134–144)
eGFR: 95 mL/min/{1.73_m2} (ref >59)

## 2020-08-10 ENCOUNTER — Telehealth: Payer: Self-pay | Admitting: *Deleted

## 2020-08-10 NOTE — Telephone Encounter (Signed)
Left HST appointment details on VM.

## 2020-08-10 NOTE — Telephone Encounter (Signed)
-----   Message from Silverio Lay, RN sent at 08/06/2020 10:47 AM EDT ----- Regarding: HST HST ordered per Dr. Lenord Fellers in chart  Thanks!

## 2020-08-11 DIAGNOSIS — H524 Presbyopia: Secondary | ICD-10-CM | POA: Diagnosis not present

## 2020-08-11 DIAGNOSIS — H52223 Regular astigmatism, bilateral: Secondary | ICD-10-CM | POA: Diagnosis not present

## 2020-08-13 ENCOUNTER — Telehealth (HOSPITAL_COMMUNITY): Payer: Self-pay | Admitting: *Deleted

## 2020-08-13 NOTE — Telephone Encounter (Signed)
Reaching out to patient to offer assistance regarding upcoming cardiac imaging study; pt verbalizes understanding of appt date/time, parking situation and where to check in, pre-test NPO status and medications ordered, and verified current allergies; name and call back number provided for further questions should they arise  Gordy Clement RN Navigator Cardiac Imaging [facility] [phone] office [phone] cell  Pt to take 106m metoprolol tartrate 2 hours prior to cardiac CT scan.

## 2020-08-15 ENCOUNTER — Ambulatory Visit (HOSPITAL_COMMUNITY)
Admission: RE | Admit: 2020-08-15 | Discharge: 2020-08-15 | Disposition: A | Discharge status: Home / Self Care | Payer: Medicaid Other | Source: Ambulatory Visit | Attending: Cardiology | Admitting: Cardiology

## 2020-08-15 ENCOUNTER — Encounter (HOSPITAL_COMMUNITY): Payer: Self-pay

## 2020-08-15 ENCOUNTER — Other Ambulatory Visit (HOSPITAL_COMMUNITY): Payer: Self-pay | Admitting: *Deleted

## 2020-08-15 ENCOUNTER — Other Ambulatory Visit: Payer: Self-pay

## 2020-08-15 ENCOUNTER — Other Ambulatory Visit: Payer: Self-pay | Admitting: Cardiology

## 2020-08-15 DIAGNOSIS — R931 Abnormal findings on diagnostic imaging of heart and coronary circulation: Secondary | ICD-10-CM

## 2020-08-15 DIAGNOSIS — R079 Chest pain, unspecified: Secondary | ICD-10-CM | POA: Diagnosis not present

## 2020-08-15 MED ORDER — IOHEXOL 350 MG/ML SOLN
95.0000 mL | Freq: Once | INTRAVENOUS | Status: AC | PRN
  Administered 2020-08-15: 95 mL via INTRAVENOUS

## 2020-08-15 MED ORDER — NITROGLYCERIN 0.4 MG SL SUBL
0.8000 mg | SUBLINGUAL_TABLET | Freq: Once | SUBLINGUAL | Status: AC
  Administered 2020-08-15: 0.8 mg via SUBLINGUAL

## 2020-08-15 MED ORDER — NITROGLYCERIN 0.4 MG SL SUBL
SUBLINGUAL_TABLET | SUBLINGUAL | Status: AC
  Filled 2020-08-15: qty 2

## 2020-08-16 ENCOUNTER — Telehealth: Payer: Self-pay | Admitting: *Deleted

## 2020-08-16 NOTE — Telephone Encounter (Signed)
Pt returning phone call... please advise  

## 2020-08-16 NOTE — Telephone Encounter (Addendum)
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  Recommend Leane Call, can we schedule appointment to discuss?  Left message for pt to call

## 2020-08-21 ENCOUNTER — Other Ambulatory Visit (INDEPENDENT_AMBULATORY_CARE_PROVIDER_SITE_OTHER): Payer: Medicaid Other

## 2020-08-21 DIAGNOSIS — K529 Noninfective gastroenteritis and colitis, unspecified: Secondary | ICD-10-CM

## 2020-08-21 DIAGNOSIS — K219 Gastro-esophageal reflux disease without esophagitis: Secondary | ICD-10-CM | POA: Diagnosis not present

## 2020-08-21 LAB — CBC WITH DIFFERENTIAL/PLATELET
Basophils Absolute: 0 10*3/uL (ref 0.0–0.1)
Basophils Relative: 0.5 % (ref 0.0–3.0)
Eosinophils Absolute: 0.1 10*3/uL (ref 0.0–0.7)
Eosinophils Relative: 2.1 % (ref 0.0–5.0)
HCT: 38.7 % (ref 36.0–46.0)
Hemoglobin: 13.1 g/dL (ref 12.0–15.0)
Lymphocytes Relative: 52.5 % — ABNORMAL HIGH (ref 12.0–46.0)
Lymphs Abs: 2.5 10*3/uL (ref 0.7–4.0)
MCHC: 33.9 g/dL (ref 30.0–36.0)
MCV: 81.9 fl (ref 78.0–100.0)
Monocytes Absolute: 0.4 10*3/uL (ref 0.1–1.0)
Monocytes Relative: 7.7 % (ref 3.0–12.0)
Neutro Abs: 1.8 10*3/uL (ref 1.4–7.7)
Neutrophils Relative %: 37.2 % — ABNORMAL LOW (ref 43.0–77.0)
Platelets: 239 10*3/uL (ref 150.0–400.0)
RBC: 4.72 Mil/uL (ref 3.87–5.11)
RDW: 13.4 % (ref 11.5–15.5)
WBC: 4.8 10*3/uL (ref 4.0–10.5)

## 2020-08-21 LAB — COMPREHENSIVE METABOLIC PANEL
ALT: 17 U/L (ref 0–35)
AST: 17 U/L (ref 0–37)
Albumin: 4.3 g/dL (ref 3.5–5.2)
Alkaline Phosphatase: 67 U/L (ref 39–117)
BUN: 13 mg/dL (ref 6–23)
CO2: 24 mEq/L (ref 19–32)
Calcium: 9 mg/dL (ref 8.4–10.5)
Chloride: 102 mEq/L (ref 96–112)
Creatinine, Ser: 0.83 mg/dL (ref 0.40–1.20)
GFR: 77.75 mL/min (ref >60.00)
Glucose, Bld: 144 mg/dL — ABNORMAL HIGH (ref 70–99)
Potassium: 3.8 mEq/L (ref 3.5–5.1)
Sodium: 135 mEq/L (ref 135–145)
Total Bilirubin: 0.4 mg/dL (ref 0.2–1.2)
Total Protein: 7.3 g/dL (ref 6.0–8.3)

## 2020-09-16 NOTE — Progress Notes (Deleted)
Cardiology Office Note:    Date:  09/16/2020   ID:  Amanda Dickerson, DOB May 28, 1960, MRN [id number]  PCP:  Gladys Damme, MD  Cardiologist:  None  Electrophysiologist:  None   Referring MD: Gladys Damme, MD   No chief complaint on file.   History of Present Illness:    Amanda Dickerson is a 60 y.o. 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  She was referred by Dr. Erin Hearing for evaluation of irregular heartbeat, initially seen on 06/15/2019.  She reports that she started having palpitations in the beginning of March.  Happens every day, can go on for hours.  Does not feel her heart is racing, but feels like heart beat is irregular.  Reports not having any palpitations this morning but had all day yesterday.  No smoking history.  Does not drink coffee, but will have occasional caffeinated soda.  Rare alcohol use.  Family history includes mother has atrial fibrillation and congestive heart failure.  Exercise Myoview 09/2014 showed no ischemia or infarct, EF 71%.  TTE 05/31/2019 showed normal biventricular function without evidence of significant valvular disease.  Labs on 05/27/2019 showed normal potassium, normal TSH.  Zio patch x3 days showed occasional PACs (2.3% of beats), with symptoms appearing to correspond to PACs.  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  Since last clinic visit,   she reports her palpitations are now rarer, typically a couple times a month. The palpitations feel like a "drum roll" and last for a few seconds. Separately, she occasionally is lightheaded. Also, she now has some sharp chest pains in her left chest. She describes it as "her heart is hurting." The chest pain  occurs a few times a month, and lasts for up to 5 minutes. There is no clear trigger and it may occur at rest or while exercising. For exercise, she tries to walk but is limited by some fatigue. She also has a pedaling machine that she may use for 20 minutes. She endorses snoring, but has not been tested for sleep apnea. She denies any shortness of breath, headaches, or syncope. Also has no lower extremity edema, orthopnea or PND.    Past Medical History:  Diagnosis Date   Anemia    Anxiety    Arthritis    KNEES   Asthma    Colitis    Dysfunctional uterine bleeding    Fibromyalgia    GERD (gastroesophageal reflux disease)    Headache(784.0)    History of cardiovascular stress test    ETT-Myoview 7/16:  EF 71%, no ischemia or infarct; Low Risk    Past Surgical History:  Procedure Laterality Date   CESAREAN SECTION  1988   COLONOSCOPY     ENDOMETRIAL ABLATION W/ NOVASURE  08/2011   MULTIPLE TOOTH EXTRACTIONS     TUBAL LIGATION  2000    Current Medications: No outpatient medications have been marked as taking for the 09/21/20 encounter (Appointment) with Donato Heinz, MD.     Allergies:   Ibuprofen and Penicillins   Social History   Socioeconomic History   Marital status: Divorced    Spouse name: Not on file   Number of children: 1   Years of education: 12   Highest education level: Not on file  Occupational History   Occupation: Disabled  Tobacco Use   Smoking status: Never   Smokeless tobacco: Never  Vaping Use   Vaping Use: Never used  Substance and Sexual Activity   Alcohol use: Yes    Comment: occasionally   Drug use: No   Sexual activity: Yes    Birth control/protection: Surgical  Other Topics Concern   Not on file  Social History Narrative   Lives alone   Caffeine - rarely   Social Determinants of Health   Financial Resource Strain: Not on file  Food Insecurity: Not on file  Transportation Needs: Not on file  Physical Activity: Not on  file  Stress: Not on file  Social Connections: Not on file     Family History: The patient's family history includes Colon polyps in her mother and sister; Diabetes in her father; Heart disease in her mother; Hyperlipidemia in her mother; Hypertension in her mother; Other in her mother; Prostate cancer in her father. There is no history of Esophageal cancer, Colon cancer, Stomach cancer, or Rectal cancer.  ROS:   Please see the history of present illness.    (+) Left chest pain, sharp/ache (+) Palpitations (+) Lightheadedness (+) Fatigue (+) Snores All other systems reviewed and are negative.  EKGs/Labs/Other Studies Reviewed:    The following studies were reviewed today:   EKG:   08/06/2020: NSR, motion artifact, rate 65, no ST abnormalities 06/15/2019: normal sinus rhythm, rate 56, no ST/T abnormalities  3 day Zio Monitor 07/04/2019: Occasional PACs (2.3% of beats). Patient triggered events appear to correspond to PACs 3 days of data recorded on Zio monitor. Patient had a min HR of 46 bpm, max HR of 154 bpm, and avg HR of 67 bpm. Predominant underlying rhythm was Sinus Rhythm. No VT,  atrial fibrillation, high degree block, or pauses noted. Three runs of SVT, longest lasting 11 beats.  Isolated ventricular ectopy was rare (<1%).  Isolated atrail ectopy was occasional (2.3%). There were 24 triggered events, corresponding to sinus rhythm +/- PACs.   No significant arrhythmias detected.   TTE 05/31/19:  1. Left ventricular ejection fraction, by estimation, is 65 to 70%. The  left ventricle has normal function. The left ventricle has no regional  wall motion abnormalities. Left ventricular diastolic parameters were  normal.   2. Right ventricular systolic function is normal. The right ventricular  size is normal. There is mildly elevated pulmonary artery systolic  pressure.   3. The mitral valve is normal in structure. Trivial mitral valve  regurgitation. No evidence of mitral  stenosis.   4. The aortic valve is tricuspid. Aortic valve regurgitation is not  visualized. No aortic stenosis is present.   Conclusion(s)/Recommendation(s): Normal biventricular function without  evidence of hemodynamically significant valvular heart disease.   Lexiscan Myoview 09/21/2014: The left ventricular ejection fraction is hyperdynamic (>65%). Nuclear stress EF: 71%. There was no ST segment deviation noted during stress. The study is normal. This is a low risk study.   Normal study with no evidence of infarct or ischemia.     Recent Labs: 08/21/2020: ALT 17; BUN 13; Creatinine, Ser 0.83; Hemoglobin 13.1; Platelets 239.0; Potassium 3.8; Sodium 135  Recent Lipid Panel    Component Value Date/Time   CHOL 289 (H) 08/08/2020 1231   TRIG 232 (H) 08/08/2020 1231   HDL 70 08/08/2020 1231   CHOLHDL 4.1 08/08/2020 1231   CHOLHDL 3.4 07/26/2015 0918   VLDL 28 07/26/2015 0918   LDLCALC 176 (H) 08/08/2020 1231  Physical Exam:    VS:  There were no vitals taken for this visit.    Wt Readings from Last 3 Encounters:  08/08/20 204 lb 3.2 oz (92.6 kg)  08/06/20 203 lb (92.1 kg)  06/04/20 203 lb 12.8 oz (92.4 kg)     GEN:  Well nourished, well developed in no acute distress HEENT: Normal NECK: No JVD; No carotid bruits CARDIAC: RRR, no murmurs, rubs, gallops RESPIRATORY:  Clear to auscultation without rales, wheezing or rhonchi  ABDOMEN: Soft, non-tender, non-distended MUSCULOSKELETAL:  No edema; No deformity  SKIN: Warm and dry NEUROLOGIC:  Alert and oriented x 3 PSYCHIATRIC:  Normal affect   ASSESSMENT:    No diagnosis found.  PLAN:    Chest pain: Atypical in description but does report can occur with exertion.  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 -Recommend stress  test to evaluate for ischemia from anomalous RCA  Palpitations: Symptoms corresponded to PACs and Zio patch.  PACs represented 2.3% of beats on monitor.  Normal thyroid function.  TTE on 06/01/2019 showed normal systolic function.  Given PACs are benign and patient reports improvement in symptoms recently with palpitations occurring infrequently, no indication for treatment at this time  Prediabetes: A1c 6.1  Snoring: Will check sleep study  Hyperlipidemia: LDL 176 on 08/08/2020.  Given 10-year ASCVD risk score 5% and calcium score 0 on 08/02/2020, does not meet indication for statin at this time.  Recommend lifestyle modifications.  RTC in ***    Medication Adjustments/Labs and Tests Ordered: Current medicines are reviewed at length with the patient today.  Concerns regarding medicines are outlined above.  No orders of the defined types were placed in this encounter.  No orders of the defined types were placed in this encounter.   There are no Patient Instructions on file for this visit.   I,Mathew Stumpf,acting as a Education administrator for Donato Heinz, MD.,have documented all relevant documentation on the behalf of Donato Heinz, MD,as directed by  Donato Heinz, MD while in the presence of Donato Heinz, MD.  I, Donato Heinz, MD, have reviewed all documentation for this visit. The documentation on 09/16/20 for the exam, diagnosis, procedures, and orders are all accurate and complete.   Signed, Donato Heinz, MD  09/16/2020 5:02 PM    [facility]

## 2020-09-18 ENCOUNTER — Ambulatory Visit (HOSPITAL_BASED_OUTPATIENT_CLINIC_OR_DEPARTMENT_OTHER): Payer: Medicaid Other | Admitting: Cardiovascular Disease

## 2020-09-18 ENCOUNTER — Other Ambulatory Visit: Payer: Self-pay

## 2020-09-18 DIAGNOSIS — R0683 Snoring: Secondary | ICD-10-CM

## 2020-09-20 NOTE — Progress Notes (Signed)
Cardiology Office Note:    Date:  09/21/2020   ID:  Amanda Dickerson, DOB 08-May-1960, MRN [id number]  PCP:  Gladys Damme, MD  Cardiologist:  None  Electrophysiologist:  None   Referring MD: Gladys Damme, MD   Chief Complaint  Patient presents with   Chest Pain     History of Present Illness:    Amanda Dickerson is a 60 y.o. 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  She was referred by Dr. Erin Hearing for evaluation of irregular heartbeat, initially seen on 06/15/2019.  She reports that she started having palpitations in the beginning of March.  Happens every day, can go on for hours.  Does not feel her heart is racing, but feels like heart beat is irregular.  Reports not having any palpitations this morning but had all day yesterday.  No smoking history.  Does not drink coffee, but will have occasional caffeinated soda.  Rare alcohol use.  Family history includes mother has atrial fibrillation and congestive heart failure.  Exercise Myoview 09/2014 showed no ischemia or infarct, EF 71%.  TTE 05/31/2019 showed normal biventricular function without evidence of significant valvular disease.  Labs on 05/27/2019 showed normal potassium, normal TSH.  Zio patch x3 days showed occasional PACs (2.3% of beats), with symptoms appearing to correspond to PACs.  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  Since last clinic visit, (06/22) she reports her palpitations are now rarer, typically a couple times a month. The palpitations feel like a "drum roll" and last for a few seconds. Separately, she occasionally is lightheaded. Also, she now has some sharp chest pains in her left chest. She describes it as "her  heart is hurting." The chest pain occurs a few times a month, and lasts for up to 5 minutes. There is no clear trigger and it may occur at rest or while exercising. For exercise, she tries to walk but is limited by some fatigue. She also has a pedaling machine that she may use for 20 minutes. She endorses snoring, but has not been tested for sleep apnea. She denies any shortness of breath, headaches, or syncope. Also has no lower extremity edema, orthopnea or PND.  Since last clinic visit, she is doing OK. No further chest pain since last clinic visit but has not been exercising. She also has brief palpitations that last a second. Denies syncope, shortness of breath or LE edema.  She currently have not been active for the last 30 days.    Past Medical History:  Diagnosis Date   Anemia    Anxiety    Arthritis    KNEES   Asthma    Colitis    Dysfunctional uterine bleeding    Fibromyalgia    GERD (gastroesophageal reflux disease)    Headache(784.0)    History of cardiovascular stress test    ETT-Myoview 7/16:  EF 71%, no ischemia or infarct; Low Risk    Past Surgical History:  Procedure Laterality Date   CESAREAN SECTION  1988   COLONOSCOPY     ENDOMETRIAL ABLATION W/ NOVASURE  08/2011   MULTIPLE TOOTH EXTRACTIONS     TUBAL LIGATION  2000    Current Medications: Current Meds  Medication Sig   albuterol (VENTOLIN HFA) 108 (90  Base) MCG/ACT inhaler INHALE 2 PUFFS EVERY 6 HOURS AS NEEDED FOR WHEEZE/SHORTNESS OF BREATH   conjugated estrogens (PREMARIN) vaginal cream Apply to vaginal area daily for 2 weeks, then reduce to twice a week   cyclobenzaprine (FLEXERIL) 5 MG tablet Take 0.5-1 tablets (2.5-5 mg total) by mouth at bedtime.   LIALDA 1.2 g EC tablet TAKE 2 TABLETS (2.4 G TOTAL) BY MOUTH 2 (TWO) TIMES DAILY.   mometasone-formoterol (DULERA) 100-5 MCG/ACT AERO Inhale 2 puffs into the lungs as needed for wheezing or shortness of breath.   omeprazole (PRILOSEC) 40 MG capsule Take 1  capsule (40 mg total) by mouth daily.   venlafaxine (EFFEXOR) 75 MG tablet Take 1 tablet (75 mg total) by mouth 2 (two) times daily.     Allergies:   Ibuprofen and Penicillins   Social History   Socioeconomic History   Marital status: Divorced    Spouse name: Not on file   Number of children: 1   Years of education: 12   Highest education level: Not on file  Occupational History   Occupation: Disabled  Tobacco Use   Smoking status: Never   Smokeless tobacco: Never  Vaping Use   Vaping Use: Never used  Substance and Sexual Activity   Alcohol use: Yes    Comment: occasionally   Drug use: No   Sexual activity: Yes    Birth control/protection: Surgical  Other Topics Concern   Not on file  Social History Narrative   Lives alone   Caffeine - rarely   Social Determinants of Health   Financial Resource Strain: Not on file  Food Insecurity: Not on file  Transportation Needs: Not on file  Physical Activity: Not on file  Stress: Not on file  Social Connections: Not on file     Family History: The patient's family history includes Colon polyps in her mother and sister; Diabetes in her father; Heart disease in her mother; Hyperlipidemia in her mother; Hypertension in her mother; Other in her mother; Prostate cancer in her father. There is no history of Esophageal cancer, Colon cancer, Stomach cancer, or Rectal cancer.  ROS:   Please see the history of present illness.    ((+) chest pains  (+) palpitations  All other systems reviewed and are negative.  EKGs/Labs/Other Studies Reviewed:    The following studies were reviewed today:   EKG:   07/22: NSR, rate 69, no ST abnormalities 08/06/2020: NSR, motion artifact, rate 65, no ST abnormalities 06/15/2019: normal sinus rhythm, rate 56, no ST/T abnormalities  3 day Zio Monitor 07/04/2019: Occasional PACs (2.3% of beats). Patient triggered events appear to correspond to PACs 3 days of data recorded on Zio monitor. Patient had  a min HR of 46 bpm, max HR of 154 bpm, and avg HR of 67 bpm. Predominant underlying rhythm was Sinus Rhythm. No VT,  atrial fibrillation, high degree block, or pauses noted. Three runs of SVT, longest lasting 11 beats.  Isolated ventricular ectopy was rare (<1%).  Isolated atrail ectopy was occasional (2.3%). There were 24 triggered events, corresponding to sinus rhythm +/- PACs.   No significant arrhythmias detected.   TTE 05/31/19:  1. Left ventricular ejection fraction, by estimation, is 65 to 70%. The  left ventricle has normal function. The left ventricle has no regional  wall motion abnormalities. Left ventricular diastolic parameters were  normal.   2. Right ventricular systolic function is normal. The right ventricular  size is normal. There is mildly elevated pulmonary artery systolic  pressure.   3. The mitral valve is normal in structure. Trivial mitral valve  regurgitation. No evidence of mitral stenosis.   4. The aortic valve is tricuspid. Aortic valve regurgitation is not  visualized. No aortic stenosis is present.   Conclusion(s)/Recommendation(s): Normal biventricular function without  evidence of hemodynamically significant valvular heart disease.   Lexiscan Myoview 09/21/2014: The left ventricular ejection fraction is hyperdynamic (>65%). Nuclear stress EF: 71%. There was no ST segment deviation noted during stress. The study is normal. This is a low risk study.   Normal study with no evidence of infarct or ischemia.     Recent Labs: 08/21/2020: ALT 17; BUN 13; Creatinine, Ser 0.83; Hemoglobin 13.1; Platelets 239.0; Potassium 3.8; Sodium 135  Recent Lipid Panel    Component Value Date/Time   CHOL 289 (H) 08/08/2020 1231   TRIG 232 (H) 08/08/2020 1231   HDL 70 08/08/2020 1231   CHOLHDL 4.1 08/08/2020 1231   CHOLHDL 3.4 07/26/2015 0918   VLDL 28 07/26/2015 0918   LDLCALC 176 (H) 08/08/2020 1231    Physical Exam:    VS:  BP 134/86   Pulse 69   Ht 5' 5"   (1.651 m)   Wt 204 lb 9.6 oz (92.8 kg)   SpO2 99%   BMI 34.05 kg/m     Wt Readings from Last 3 Encounters:  09/21/20 204 lb 9.6 oz (92.8 kg)  09/18/20 203 lb (92.1 kg)  08/08/20 204 lb 3.2 oz (92.6 kg)     GEN:  Well nourished, well developed in no acute distress HEENT: Normal NECK: No JVD; No carotid bruits CARDIAC: RRR, no murmurs, rubs, gallops RESPIRATORY:  Clear to auscultation without rales, wheezing or rhonchi  ABDOMEN: Soft, non-tender, non-distended MUSCULOSKELETAL:  No edema; No deformity  SKIN: Warm and dry NEUROLOGIC:  Alert and oriented x 3 PSYCHIATRIC:  Normal affect   ASSESSMENT:    1. Chest pain of uncertain etiology   2. Anomalous right coronary artery   3. Palpitations   4. Hyperlipidemia, unspecified hyperlipidemia type     PLAN:    Chest pain: Atypical in description but does report can occur with exertion.  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 -Recommend stress test to evaluate for ischemia from anomalous RCA.  Will plan exercise Myoview  Palpitations: Symptoms corresponded to PACs and Zio patch.  PACs represented 2.3% of beats on monitor.  Normal thyroid function.  TTE on 06/01/2019 showed normal systolic function.  Given PACs are benign and patient reports improvement in symptoms recently with palpitations occurring infrequently, no indication for treatment at this time  Prediabetes: A1c 6.1  Snoring: Will check sleep study  Hyperlipidemia: LDL 176 on 08/08/2020.  Given 10-year ASCVD risk score 5% and calcium score 0 on 08/02/2020, does not meet indication for statin at this time.  Recommend lifestyle modifications.  RTC in 6 months   Shared Decision Making/Informed Consent The risks [chest pain, shortness of breath, cardiac arrhythmias, dizziness, blood  pressure fluctuations, myocardial infarction, stroke/transient ischemic attack, nausea, vomiting, allergic reaction, radiation exposure, metallic taste sensation and life-threatening complications (estimated to be 1 in 10,000)], benefits (risk stratification, diagnosing coronary artery disease, treatment guidance) and alternatives of a nuclear stress test were discussed in detail with Ms. Vanderheiden and she agrees to proceed.    Medication Adjustments/Labs and Tests Ordered: Current  medicines are reviewed at length with the patient today.  Concerns regarding medicines are outlined above.  Orders Placed This Encounter  Procedures   MYOCARDIAL PERFUSION IMAGING   EKG 12-Lead    No orders of the defined types were placed in this encounter.   Patient Instructions  Medication Instructions:  Your physician recommends that you continue on your current medications as directed. Please refer to the Current Medication list given to you today.  *If you need a refill on your cardiac medications before your next appointment, please call your pharmacy*  Testing/Procedures: Your physician has requested that you have an exercise stress myoview. For further information please visit [email or web address]. Please follow instruction sheet, as given.  Follow-Up: At [facility], you and your health needs are our priority.  As part of our continuing mission to provide you with exceptional heart care, we have created designated Provider Care Teams.  These Care Teams include your primary Cardiologist (physician) and Advanced Practice Providers (APPs -  Physician Assistants and Nurse Practitioners) who all work together to provide you with the care you need, when you need it.  We recommend signing up for the patient portal called "MyChart".  Sign up information is provided on this After Visit Summary.  MyChart is used to connect with patients for Virtual Visits (Telemedicine).  Patients are able to view lab/test  results, encounter notes, upcoming appointments, etc.  Non-urgent messages can be sent to your provider as well.   To learn more about what you can do with MyChart, go to [email or web address].    Your next appointment:   6 month(s)  The format for your next appointment:   In Person  Provider:   Oswaldo Milian, MD      Ardell Isaacs as a scribe for Donato Heinz, MD.,have documented all relevant documentation on the behalf of Donato Heinz, MD,as directed by  Donato Heinz, MD while in the presence of Donato Heinz, MD.  I, Donato Heinz, MD, have reviewed all documentation for this visit. The documentation on 09/21/20 for the exam, diagnosis, procedures, and orders are all accurate and complete.    Signed, Donato Heinz, MD  09/21/2020 11:15 AM    [place]

## 2020-09-21 ENCOUNTER — Other Ambulatory Visit: Payer: Self-pay

## 2020-09-21 ENCOUNTER — Encounter: Payer: Self-pay | Admitting: Cardiology

## 2020-09-21 ENCOUNTER — Ambulatory Visit (INDEPENDENT_AMBULATORY_CARE_PROVIDER_SITE_OTHER): Payer: Medicaid Other | Admitting: Cardiology

## 2020-09-21 VITALS — BP 134/86 | HR 69 | Ht 65.0 in | Wt 204.6 lb

## 2020-09-21 DIAGNOSIS — R002 Palpitations: Secondary | ICD-10-CM | POA: Diagnosis not present

## 2020-09-21 DIAGNOSIS — E785 Hyperlipidemia, unspecified: Secondary | ICD-10-CM

## 2020-09-21 DIAGNOSIS — R079 Chest pain, unspecified: Secondary | ICD-10-CM | POA: Diagnosis not present

## 2020-09-21 DIAGNOSIS — Q245 Malformation of coronary vessels: Secondary | ICD-10-CM | POA: Diagnosis not present

## 2020-09-21 NOTE — Patient Instructions (Signed)
Medication Instructions:  Your physician recommends that you continue on your current medications as directed. Please refer to the Current Medication list given to you today.  *If you need a refill on your cardiac medications before your next appointment, please call your pharmacy*  Testing/Procedures: Your physician has requested that you have an exercise stress myoview. For further information please visit [email or web address]. Please follow instruction sheet, as given.  Follow-Up: At [facility], you and your health needs are our priority.  As part of our continuing mission to provide you with exceptional heart care, we have created designated Provider Care Teams.  These Care Teams include your primary Cardiologist (physician) and Advanced Practice Providers (APPs -  Physician Assistants and Nurse Practitioners) who all work together to provide you with the care you need, when you need it.  We recommend signing up for the patient portal called "MyChart".  Sign up information is provided on this After Visit Summary.  MyChart is used to connect with patients for Virtual Visits (Telemedicine).  Patients are able to view lab/test results, encounter notes, upcoming appointments, etc.  Non-urgent messages can be sent to your provider as well.   To learn more about what you can do with MyChart, go to [email or web address].    Your next appointment:   6 month(s)  The format for your next appointment:   In Person  Provider:   Oswaldo Milian, MD

## 2020-09-25 ENCOUNTER — Other Ambulatory Visit: Payer: Self-pay | Admitting: Family Medicine

## 2020-09-25 ENCOUNTER — Other Ambulatory Visit: Payer: Self-pay | Admitting: Physician Assistant

## 2020-09-25 DIAGNOSIS — R232 Flushing: Secondary | ICD-10-CM

## 2020-09-26 ENCOUNTER — Encounter (HOSPITAL_COMMUNITY): Payer: Medicaid Other

## 2020-09-26 ENCOUNTER — Telehealth (HOSPITAL_COMMUNITY): Payer: Self-pay | Admitting: *Deleted

## 2020-09-26 NOTE — Telephone Encounter (Signed)
Patient given detailed instructions per Myocardial Perfusion Study Information Sheet for the test on 10/03/20. Patient notified to arrive 15 minutes early and that it is imperative to arrive on time for appointment to keep from having the test rescheduled.  If you need to cancel or reschedule your appointment, please call the office within 24 hours of your appointment. . Patient verbalized understanding. Amanda Dickerson

## 2020-10-01 ENCOUNTER — Other Ambulatory Visit: Payer: Self-pay

## 2020-10-01 ENCOUNTER — Ambulatory Visit (HOSPITAL_BASED_OUTPATIENT_CLINIC_OR_DEPARTMENT_OTHER): Payer: Medicaid Other | Attending: Cardiology | Admitting: Cardiovascular Disease

## 2020-10-01 DIAGNOSIS — R0683 Snoring: Secondary | ICD-10-CM

## 2020-10-03 ENCOUNTER — Ambulatory Visit (HOSPITAL_COMMUNITY): Payer: Medicaid Other | Attending: Cardiovascular Disease

## 2020-10-03 ENCOUNTER — Other Ambulatory Visit: Payer: Self-pay

## 2020-10-03 DIAGNOSIS — Q245 Malformation of coronary vessels: Secondary | ICD-10-CM | POA: Insufficient documentation

## 2020-10-03 DIAGNOSIS — R079 Chest pain, unspecified: Secondary | ICD-10-CM | POA: Diagnosis not present

## 2020-10-03 LAB — MYOCARDIAL PERFUSION IMAGING
Estimated workload: 8.5 METS
Exercise duration (min): 7 min
Exercise duration (sec): 0 s
LV dias vol: 58 mL (ref 46–106)
LV sys vol: 17 mL
MPHR: 162 {beats}/min
Peak HR: 153 {beats}/min
Percent HR: 94 %
Rest HR: 76 {beats}/min
SDS: 0
SRS: 0
SSS: 0
TID: 0.94

## 2020-10-03 MED ORDER — TECHNETIUM TC 99M TETROFOSMIN IV KIT
10.7000 | PACK | Freq: Once | INTRAVENOUS | Status: AC | PRN
  Administered 2020-10-03: 10.7 via INTRAVENOUS
  Filled 2020-10-03: qty 11

## 2020-10-03 MED ORDER — TECHNETIUM TC 99M TETROFOSMIN IV KIT
32.3000 | PACK | Freq: Once | INTRAVENOUS | Status: AC | PRN
Start: 2020-10-03 — End: 2020-10-03
  Administered 2020-10-03: 32.3 via INTRAVENOUS
  Filled 2020-10-03: qty 33

## 2020-10-04 ENCOUNTER — Other Ambulatory Visit: Payer: Self-pay

## 2020-10-04 MED ORDER — AMLODIPINE BESYLATE 5 MG PO TABS: 5.0000 mg | ORAL_TABLET | Freq: Every day | ORAL | 3 refills | Status: DC

## 2020-10-18 ENCOUNTER — Encounter (HOSPITAL_BASED_OUTPATIENT_CLINIC_OR_DEPARTMENT_OTHER): Payer: Self-pay | Admitting: Cardiovascular Disease

## 2020-10-18 ENCOUNTER — Telehealth: Payer: Self-pay | Admitting: *Deleted

## 2020-10-18 NOTE — Telephone Encounter (Signed)
Left message to return a call to discuss HST results.

## 2020-10-18 NOTE — Procedures (Signed)
     Patient Name: Amanda Dickerson, Amanda Dickerson Date: 10/01/2020 Gender: Female D.O.B: 02-May-1960 Age (years): 60 Referring Provider: Oswaldo Milian Height (inches): 31 Interpreting Physician: Shelva Majestic MD, ABSM Weight (lbs): 203 RPSGT: Gerhard Perches BMI: 34 MRN: [id number] Neck Size: 15.00  CLINICAL INFORMATION Sleep Study Type: HST  Indication for sleep study: snoring  Epworth Sleepiness Score: 7  SLEEP STUDY TECHNIQUE A multi-channel overnight portable sleep study was performed. The channels recorded were: nasal airflow, thoracic respiratory movement, and oxygen saturation with a pulse oximetry. Snoring was also monitored.  MEDICATIONS albuterol (VENTOLIN HFA) 108 (90 Base) MCG/ACT inhaler amLODipine (NORVASC) 5 MG tablet conjugated estrogens (PREMARIN) vaginal cream cyclobenzaprine (FLEXERIL) 5 MG tablet LIALDA 1.2 g EC tablet mometasone-formoterol (DULERA) 100-5 MCG/ACT AERO omeprazole (PRILOSEC) 40 MG capsule venlafaxine (EFFEXOR) 75 MG tablet  Patient self administered medications include: N/A.  SLEEP ARCHITECTURE Patient was studied for 379.5 minutes. The sleep efficiency was 99.9 % and the patient was supine for 0%. The arousal index was 0.0 per hour.  RESPIRATORY PARAMETERS The overall AHI was 0.5 per hour, with a central apnea index of 0 per hour.  The oxygen nadir was 94% during sleep.  CARDIAC DATA Mean heart rate during sleep was 61.9 bpm.  IMPRESSIONS - No significant obstructive sleep apnea occurred during this study (AHI 0.5/h). Supine sleep was absent. - The patient had no oxygen desaturation during the study (Min O2 94%) - No snoring was audible during this study.  DIAGNOSIS - Normal study  RECOMMENDATIONS - At present there is no indication for CPAP therapy. - Avoid alcohol, sedatives and other CNS depressants that may worsen sleep apnea and disrupt normal sleep architecture. - Sleep hygiene should be reviewed to assess factors  that may improve sleep quality. - Weight management (BMI 34) and regular exercise should be initiated or continued.  [Electronically signed] 10/18/2020 12:48 PM  Shelva Majestic MD, ABSM Diplomate, American Board of Sleep Medicine   NPI: [id number] [place] PH: [phone]   FX: [phone] [employer]

## 2020-10-18 NOTE — Telephone Encounter (Signed)
-----   Message from Troy Sine, MD sent at 10/18/2020 12:53 PM EDT ----- Jonetta Speak notify pt of results

## 2020-10-19 ENCOUNTER — Telehealth: Payer: Self-pay | Admitting: *Deleted

## 2020-10-19 NOTE — Telephone Encounter (Signed)
-----   Message from Troy Sine, MD sent at 10/18/2020 12:53 PM EDT ----- Jonetta Speak notify pt of results

## 2020-10-19 NOTE — Telephone Encounter (Signed)
Patient returned a call to me and was notified of her negative HST results.

## 2020-10-23 ENCOUNTER — Telehealth: Payer: Self-pay | Admitting: Cardiology

## 2020-10-23 ENCOUNTER — Other Ambulatory Visit: Payer: Self-pay | Admitting: Physician Assistant

## 2020-10-23 NOTE — Telephone Encounter (Signed)
The patient called to give her blood pressure readings. She did state that the readings were done one hour after her morning medications.

## 2020-10-23 NOTE — Telephone Encounter (Signed)
Patient called to give blood pressure readings for 2 weeks starting on 10/05/20:  131/81 138/81 116/78 132/81 130/78 134/86 137/80 133/81 131/73 120/70 116/69 123/73 123/79 116/71 134/77

## 2020-10-24 NOTE — Telephone Encounter (Signed)
Patient aware and verbalized understanding. °

## 2020-10-24 NOTE — Telephone Encounter (Signed)
BP looks good, no changes recommended

## 2020-11-28 ENCOUNTER — Telehealth: Payer: Self-pay | Admitting: Physician Assistant

## 2020-11-28 MED ORDER — LIALDA 1.2 G PO TBEC: 2.4000 g | DELAYED_RELEASE_TABLET | Freq: Two times a day (BID) | ORAL | 0 refills | Status: DC

## 2020-11-28 NOTE — Telephone Encounter (Signed)
Script sent to pharmacy.

## 2020-11-30 ENCOUNTER — Telehealth: Payer: Self-pay | Admitting: *Deleted

## 2020-11-30 ENCOUNTER — Ambulatory Visit: Payer: Medicaid Other | Admitting: Cardiology

## 2020-11-30 NOTE — Telephone Encounter (Signed)
PA submitted and approved via [employer] for Mesalamine 1.2 GM tablets.

## 2020-12-26 ENCOUNTER — Other Ambulatory Visit: Payer: Self-pay | Admitting: Physician Assistant

## 2020-12-28 ENCOUNTER — Other Ambulatory Visit: Payer: Self-pay

## 2020-12-28 DIAGNOSIS — J4520 Mild intermittent asthma, uncomplicated: Secondary | ICD-10-CM

## 2020-12-28 MED ORDER — MOMETASONE FURO-FORMOTEROL FUM 100-5 MCG/ACT IN AERO: 2.0000 | INHALATION_SPRAY | RESPIRATORY_TRACT | 3 refills | Status: DC | PRN

## 2021-01-25 ENCOUNTER — Other Ambulatory Visit: Payer: Self-pay | Admitting: Family Medicine

## 2021-01-25 DIAGNOSIS — R232 Flushing: Secondary | ICD-10-CM

## 2021-02-12 DIAGNOSIS — L299 Pruritus, unspecified: Secondary | ICD-10-CM | POA: Diagnosis not present

## 2021-03-04 NOTE — Progress Notes (Signed)
Cardiology Office Note:    Date:  03/05/2021   ID:  Amanda Dickerson, DOB 1960-10-07, MRN [id number]  PCP:  Gladys Damme, MD  Cardiologist:  None  Electrophysiologist:  None   Referring MD: Gladys Damme, MD   Chief Complaint  Patient presents with   Chest Pain     History of Present Illness:    Amanda Dickerson is a 61 y.o. 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  She was referred by Dr. Erin Hearing for evaluation of irregular heartbeat, initially seen on 06/15/2019.  She reports that she started having palpitations in the beginning of March.  Happens every day, can go on for hours.  Does not feel her heart is racing, but feels like heart beat is irregular.  Reports not having any palpitations this morning but had all day yesterday.  No smoking history.  Does not drink coffee, but will have occasional caffeinated soda.  Rare alcohol use.  Family history includes mother has atrial fibrillation and congestive heart failure.  Exercise Myoview 09/2014 showed no ischemia or infarct, EF 71%.  TTE 05/31/2019 showed normal biventricular function without evidence of significant valvular disease.  Labs on 05/27/2019 showed normal potassium, normal TSH.  Zio patch x3 days showed occasional PACs (2.3% of beats), with symptoms appearing to correspond to PACs.  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  Exercise Myoview on 10/03/2020 showed fair exercise capacity (8.5 METS), hypertensive response to exercise (peak BP 210/85), no EKG evidence of ischemia, EF 70%, normal perfusion.  Since last clinic visit, she reports that she has been doing okay.  Does report occasional soreness in chest.  Also having issues with  right shoulder pain.  Palpitations have improved.  She has not been exercising.  Does report some lightheadedness but no syncope.   Past Medical History:  Diagnosis Date   Anemia    Anxiety    Arthritis    KNEES   Asthma    Colitis    Dysfunctional uterine bleeding    Fibromyalgia    GERD (gastroesophageal reflux disease)    Headache(784.0)    History of cardiovascular stress test    ETT-Myoview 7/16:  EF 71%, no ischemia or infarct; Low Risk    Past Surgical History:  Procedure Laterality Date   CESAREAN SECTION  1988   COLONOSCOPY     ENDOMETRIAL ABLATION W/ NOVASURE  08/2011   MULTIPLE TOOTH EXTRACTIONS     TUBAL LIGATION  2000    Current Medications: Current Meds  Medication Sig   albuterol (VENTOLIN HFA) 108 (90 Base) MCG/ACT inhaler INHALE 2 PUFFS EVERY 6 HOURS AS NEEDED FOR WHEEZE/SHORTNESS OF BREATH   amLODipine (NORVASC) 5 MG tablet Take 1 tablet (5 mg total) by mouth daily.   conjugated estrogens (PREMARIN) vaginal cream Apply to vaginal area daily for 2 weeks, then reduce to twice a week   cyclobenzaprine (FLEXERIL) 5 MG tablet Take 0.5-1 tablets (2.5-5 mg total) by mouth at bedtime.   LIALDA 1.2 g EC tablet TAKE 2 TABLETS (2.4 G TOTAL) BY MOUTH 2 (TWO) TIMES DAILY.   mometasone-formoterol (DULERA) 100-5 MCG/ACT AERO Inhale 2 puffs into the lungs as needed for wheezing or shortness of breath.   omeprazole (PRILOSEC) 40 MG capsule Take 1 capsule (40  mg total) by mouth daily.   venlafaxine (EFFEXOR) 75 MG tablet TAKE 1 TABLET BY MOUTH TWICE A DAY     Allergies:   Ibuprofen and Penicillins   Social History   Socioeconomic History   Marital status: Divorced    Spouse name: Not on file   Number of children: 1   Years of education: 12   Highest education level: Not on file  Occupational History   Occupation: Disabled  Tobacco Use   Smoking status: Never   Smokeless tobacco: Never  Vaping Use   Vaping Use: Never used  Substance and Sexual Activity    Alcohol use: Yes    Comment: occasionally   Drug use: No   Sexual activity: Yes    Birth control/protection: Surgical  Other Topics Concern   Not on file  Social History Narrative   Lives alone   Caffeine - rarely   Social Determinants of Health   Financial Resource Strain: Not on file  Food Insecurity: Not on file  Transportation Needs: Not on file  Physical Activity: Not on file  Stress: Not on file  Social Connections: Not on file     Family History: The patient's family history includes Colon polyps in her mother and sister; Diabetes in her father; Heart disease in her mother; Hyperlipidemia in her mother; Hypertension in her mother; Other in her mother; Prostate cancer in her father. There is no history of Esophageal cancer, Colon cancer, Stomach cancer, or Rectal cancer.  ROS:   Please see the history of present illness.     All other systems reviewed and are negative.  EKGs/Labs/Other Studies Reviewed:    The following studies were reviewed today:   EKG:   03/05/21:sinus rhythm, rate 59, no ST abnormalities 07/22: NSR, rate 69, no ST abnormalities 08/06/2020: NSR, motion artifact, rate 65, no ST abnormalities 06/15/2019: normal sinus rhythm, rate 56, no ST/T abnormalities  3 day Zio Monitor 07/04/2019: Occasional PACs (2.3% of beats). Patient triggered events appear to correspond to PACs 3 days of data recorded on Zio monitor. Patient had a min HR of 46 bpm, max HR of 154 bpm, and avg HR of 67 bpm. Predominant underlying rhythm was Sinus Rhythm. No VT,  atrial fibrillation, high degree block, or pauses noted. Three runs of SVT, longest lasting 11 beats.  Isolated ventricular ectopy was rare (<1%).  Isolated atrail ectopy was occasional (2.3%). There were 24 triggered events, corresponding to sinus rhythm +/- PACs.   No significant arrhythmias detected.   TTE 05/31/19:  1. Left ventricular ejection fraction, by estimation, is 65 to 70%. The  left ventricle has normal  function. The left ventricle has no regional  wall motion abnormalities. Left ventricular diastolic parameters were  normal.   2. Right ventricular systolic function is normal. The right ventricular  size is normal. There is mildly elevated pulmonary artery systolic  pressure.   3. The mitral valve is normal in structure. Trivial mitral valve  regurgitation. No evidence of mitral stenosis.   4. The aortic valve is tricuspid. Aortic valve regurgitation is not  visualized. No aortic stenosis is present.   Conclusion(s)/Recommendation(s): Normal biventricular function without  evidence of hemodynamically significant valvular heart disease.   Lexiscan Myoview 09/21/2014: The left ventricular ejection fraction is hyperdynamic (>65%). Nuclear stress EF: 71%. There was no ST segment deviation noted during stress. The study is normal. This is a low risk study.   Normal study with no evidence of infarct or ischemia.  Recent Labs: 08/21/2020: ALT 17; BUN 13; Creatinine, Ser 0.83; Hemoglobin 13.1; Platelets 239.0; Potassium 3.8; Sodium 135  Recent Lipid Panel    Component Value Date/Time   CHOL 289 (H) 08/08/2020 1231   TRIG 232 (H) 08/08/2020 1231   HDL 70 08/08/2020 1231   CHOLHDL 4.1 08/08/2020 1231   CHOLHDL 3.4 07/26/2015 0918   VLDL 28 07/26/2015 0918   LDLCALC 176 (H) 08/08/2020 1231    Physical Exam:    VS:  BP 120/78    Pulse (!) 59    Ht 5' 5"  (1.651 m)    Wt 205 lb 3.2 oz (93.1 kg)    SpO2 98%    BMI 34.15 kg/m     Wt Readings from Last 3 Encounters:  03/05/21 205 lb 3.2 oz (93.1 kg)  10/03/20 203 lb (92.1 kg)  10/02/20 203 lb (92.1 kg)     GEN:  Well nourished, well developed in no acute distress HEENT: Normal NECK: No JVD; No carotid bruits CARDIAC: RRR, no murmurs, rubs, gallops RESPIRATORY:  Clear to auscultation without rales, wheezing or rhonchi  ABDOMEN: Soft, non-tender, non-distended MUSCULOSKELETAL:  No edema; No deformity  SKIN: Warm and  dry NEUROLOGIC:  Alert and oriented x 3 PSYCHIATRIC:  Normal affect   ASSESSMENT:    1. Anomalous right coronary artery   2. Palpitations   3. Chest pain of uncertain etiology   4. Hyperlipidemia, unspecified hyperlipidemia type   5. Essential hypertension      PLAN:    Chest pain: Atypical in description but does report can occur with exertion.  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   Exercise Myoview on 10/03/2020 showed fair exercise capacity (8.5 METS), hypertensive response to exercise (peak BP 210/85), no EKG evidence of ischemia, EF 70%, normal perfusion.  Palpitations: Symptoms corresponded to PACs and Zio patch.  PACs represented 2.3% of beats on monitor.  Normal thyroid function.  TTE on 06/01/2019 showed normal systolic function.  Given PACs are benign and patient reports improvement in symptoms recently with palpitations occurring infrequently, no indication for treatment at this time  Prediabetes: A1c 6.1  Snoring: Sleep study unremarkable on 10/02/2018  Hyperlipidemia: LDL 176 on 08/08/2020.  Given 10-year ASCVD risk score 5% and calcium score 0 on 08/02/2020, does not meet indication for statin at this time.  Recommend lifestyle modifications.  Hypertension: on amlodipine 32m daily.  Appears controlled  RTC in 6 months      Medication Adjustments/Labs and Tests Ordered: Current medicines are reviewed at length with the patient today.  Concerns regarding medicines are outlined above.  Orders Placed This Encounter  Procedures   EKG 12-Lead    No orders of the defined types were placed in this encounter.    Patient Instructions   Follow-Up: At [facility] you and your health needs are our priority.  As part of our continuing mission to provide you with exceptional  heart care, we have created designated Provider Care Teams.  These Care Teams include your primary Cardiologist (physician) and Advanced Practice Providers (APPs -  Physician Assistants and Nurse Practitioners) who all work together to provide you with the care you need, when you need it.  We recommend signing up for the patient portal called "MyChart".  Sign up information is provided on this After Visit Summary.  MyChart  is used to connect with patients for Virtual Visits (Telemedicine).  Patients are able to view lab/test results, encounter notes, upcoming appointments, etc.  Non-urgent messages can be sent to your provider as well.   To learn more about what you can do with MyChart, go to [email or web address].    Your next appointment:   6 month(s)  The format for your next appointment:   In Person  Provider:   Oswaldo Milian MD         Signed, Donato Heinz, MD  03/05/2021 1:46 PM    [place]

## 2021-03-05 ENCOUNTER — Other Ambulatory Visit: Payer: Self-pay

## 2021-03-05 ENCOUNTER — Ambulatory Visit (INDEPENDENT_AMBULATORY_CARE_PROVIDER_SITE_OTHER): Payer: Medicaid Other | Admitting: Cardiology

## 2021-03-05 VITALS — BP 120/78 | HR 59 | Ht 65.0 in | Wt 205.2 lb

## 2021-03-05 DIAGNOSIS — E785 Hyperlipidemia, unspecified: Secondary | ICD-10-CM

## 2021-03-05 DIAGNOSIS — I10 Essential (primary) hypertension: Secondary | ICD-10-CM | POA: Diagnosis not present

## 2021-03-05 DIAGNOSIS — Q245 Malformation of coronary vessels: Secondary | ICD-10-CM | POA: Diagnosis not present

## 2021-03-05 DIAGNOSIS — R079 Chest pain, unspecified: Secondary | ICD-10-CM | POA: Diagnosis not present

## 2021-03-05 DIAGNOSIS — R002 Palpitations: Secondary | ICD-10-CM | POA: Diagnosis not present

## 2021-03-05 NOTE — Patient Instructions (Signed)
°  Follow-Up: At [facility], you and your health needs are our priority.  As part of our continuing mission to provide you with exceptional heart care, we have created designated Provider Care Teams.  These Care Teams include your primary Cardiologist (physician) and Advanced Practice Providers (APPs -  Physician Assistants and Nurse Practitioners) who all work together to provide you with the care you need, when you need it.  We recommend signing up for the patient portal called "MyChart".  Sign up information is provided on this After Visit Summary.  MyChart is used to connect with patients for Virtual Visits (Telemedicine).  Patients are able to view lab/test results, encounter notes, upcoming appointments, etc.  Non-urgent messages can be sent to your provider as well.   To learn more about what you can do with MyChart, go to [email or web address].    Your next appointment:   6 month(s)  The format for your next appointment:   In Person  Provider:   Oswaldo Milian MD

## 2021-03-06 ENCOUNTER — Other Ambulatory Visit: Payer: Self-pay

## 2021-03-06 ENCOUNTER — Encounter: Payer: Self-pay | Admitting: Family Medicine

## 2021-03-06 ENCOUNTER — Ambulatory Visit (INDEPENDENT_AMBULATORY_CARE_PROVIDER_SITE_OTHER): Payer: Medicaid Other | Admitting: Family Medicine

## 2021-03-06 DIAGNOSIS — M75101 Unspecified rotator cuff tear or rupture of right shoulder, not specified as traumatic: Secondary | ICD-10-CM

## 2021-03-06 NOTE — Assessment & Plan Note (Signed)
Discussed treatment options of PT and shoulder injection.  She recalls a remote shoulder injection in the past that didn't help her much.  As such, she would like to start with PT.

## 2021-03-06 NOTE — Progress Notes (Signed)
° ° °  SUBJECTIVE:   CHIEF COMPLAINT / HPI:   Right shoulder pain for 6-12 months.  Pain at night and cannot sleep on that side.  Right handed.  Had shoulder problem in the remote past.  I found old right shoulder x-rays showing mild degenerative changes from 2018.  No injury. She has been previously dxed with fibromyalgia.  Specifically, this is not bilateral shoulder pain.    She added as an aside that it has been a tough year.  There have been five deaths in her family.    OBJECTIVE:   BP 134/90    Pulse 66    Wt 204 lb 12.8 oz (92.9 kg)    SpO2 98%    BMI 34.08 kg/m   Full ROM or right and left shoulder.  Does seem to have subtle weakness of right biceps and triceps.  Pos empty can sign.  Supraspinatus weakness.      ASSESSMENT/PLAN:   Rotator cuff syndrome of right shoulder Discussed treatment options of PT and shoulder injection.  She recalls a remote shoulder injection in the past that didn't help her much.  As such, she would like to start with PT.       Zenia Resides, MD [place]

## 2021-03-06 NOTE — Patient Instructions (Signed)
I feel confident in my clinical diagnosis of right rotator cuff syndrome.  This is a common wear and tear problem at your age.   Someone should call about physical therapy. An option you have is a steroid injection of that shoulder.

## 2021-03-19 NOTE — Therapy (Signed)
OUTPATIENT PHYSICAL THERAPY SHOULDER EVALUATION   Patient Name: Amanda Dickerson MRN: [id number] DOB:Sep 16, 1960, 61 y.o., female Today's Date: 03/20/2021   PT End of Session - 03/20/21 0944     Visit Number 1    Number of Visits 15    Date for PT Re-Evaluation 06/12/21    Authorization Type [place] [facility] - [facility]    Authorization Time Period auth requested on 03/20/21    PT Start Time 0945    PT Stop Time 1025    PT Time Calculation (min) 40 min    Activity Tolerance Patient tolerated treatment well    Behavior During Therapy WFL for tasks assessed/performed             Past Medical History:  Diagnosis Date   Anemia    Anxiety    Arthritis    KNEES   Asthma    Colitis    Dysfunctional uterine bleeding    Fibromyalgia    GERD (gastroesophageal reflux disease)    Headache(784.0)    History of cardiovascular stress test    ETT-Myoview 7/16:  EF 71%, no ischemia or infarct; Low Risk   Past Surgical History:  Procedure Laterality Date   CESAREAN SECTION  1988   COLONOSCOPY     ENDOMETRIAL ABLATION W/ NOVASURE  08/2011   MULTIPLE TOOTH EXTRACTIONS     TUBAL LIGATION  2000   Patient Active Problem List   Diagnosis Date Noted   Rotator cuff syndrome of right shoulder 03/06/2021   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 prior episode ([place]) 04/13/2020   GAD (generalized anxiety disorder) 04/13/2020   Grief 04/13/2020   Dyspareunia, female 07/19/2019   Irregular heart beat 05/30/2019   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   Episodic recurrent vertigo 07/26/2015   Hyperlipidemia 07/26/2015   Pre-diabetes 07/26/2015   Obesity 07/26/2015   Gastroesophageal reflux disease 11/29/2013   Asthma, mild intermittent 10/25/2012   Fibromyalgia 07/18/2011    PCP: Gladys Damme, MD  REFERRING PROVIDER: Zenia Resides, MD  REFERRING DIAG:  M75.101 (ICD-10-CM) - Rotator cuff syndrome of right shoulder  THERAPY DIAG:  Chronic right  shoulder pain  Muscle weakness (generalized)  ONSET DATE: Chronic  SUBJECTIVE:                                                                                                                                                                                      SUBJECTIVE STATEMENT: Pt presents to PT with reports of chronic R shoulder pain and discomfort. Denies MOI and notes gradual onset of increasing R shoulder pain. She notes occasional numbness down R UE, is unsure of exacerbating movements. Main  compliant is trouble sleeping, with inability to get comfortable and increased R shoulder pain when lying down. Denies red flag questions.   PERTINENT HISTORY: Chronic R shoulder pain for last year; also refers into neck and down R UE  PAIN:  Are you having pain? Yes NPRS scale: 3/10 (8/10 at worst) Pain location: R shoulder, R UE, R side of neck PAIN TYPE: aching Pain description: intermittent  Aggravating factors: laying on R side, R UE rotation Relieving factors: unsure  PRECAUTIONS: None  WEIGHT BEARING RESTRICTIONS No  FALLS:  Has patient fallen in last 6 months? No Number of falls: N/A  LIVING ENVIRONMENT: Lives with: lives with their family Lives in: House/apartment Stairs: Yes; External: 5 steps; bilateral but cannot reach both Has following equipment at home: None  OCCUPATION: Not currently working  PLOF: Independent and Independent with basic ADLs  PATIENT GOALS: pt wants to decrease pain in R UE in order to improve comfort with daily activities  OBJECTIVE:   DIAGNOSTIC FINDINGS:  N/A  PATIENT SURVEYS:  Quick Dash 50% disability  COGNITION:  Overall cognitive status: Within functional limits for tasks assessed     SENSATION:  Light touch: Appears intact  POSTURE: Medium body habitus, rounded shoulders, fwd head  PALPATION: TTP to R upper trap, R infraspinatus  UPPER EXTREMITY AROM/PROM:  AROM Right 03/20/2021 Left 03/20/2021  Shoulder flexion 115  135  Shoulder extension    Shoulder abduction 98* 121  Shoulder internal rotation 15* 20  Shoulder external rotation 90 90  (Blank rows = not tested, score listed is out of 5 possible points.  N = WNL, D = diminished, * = concordant pain with testing)  UPPER EXTREMITY MMT:  MMT Right 03/20/2021 Left 03/20/2021  Shoulder flexion 3/5 4/5  Shoulder abduction (C5) 3/5 4/5  Shoulder ER 3/5 4/5  Shoulder IR 2+/5* 4/5  Grip strength 20# 40#   (Blank rows = not tested, score listed is out of 5 possible points.  N = WNL, D = diminished, C = clear for gross weakness with myotome testing, * = concordant pain with testing)   SHOULDER SPECIAL TESTS:  Impingement tests: Hawkins/Kennedy impingement test: positive  and Painful arc test: positive    JOINT MOBILITY TESTING:  N/A  TODAY'S TREATMENT:  [facility] Adult PT Treatment:                                                DATE: 03/20/2021 Therapeutic Exercise: R upper trap stretch x 20" R levator stretch x 20" Row x 10 GTB R shoulder IR/ER isometric x 5 - 5" Manual Therapy: N/A Neuromuscular re-ed: N/A Therapeutic Activity: N/A Modalities: N/A Self Care: N/A    PATIENT EDUCATION: Education details: eval findings, Quick DASH, HEP, POC Person educated: Patient Education method: Explanation, Demonstration, and Handouts Education comprehension: verbalized understanding and returned demonstration   HOME EXERCISE PROGRAM: Access Code: [email or web address]  ASSESSMENT:  CLINICAL IMPRESSION: Patient is a 61 y.o. F who was seen today for physical therapy evaluation and treatment for chronic R shoulder pain and discomfort. Objective impairments include decreased ROM, decreased strength, and pain. These impairments are limiting patient from cleaning, community activity, driving, meal prep, shopping, and yard work. Personal factors including Time since onset of injury/illness/exacerbation are also affecting patient's functional outcome. Due to current  s/s, unable to rule out RTC pathology contributing to pt's  symptoms. Her quick DASH score indicates severe disability in the performance of home ADLs and overall functional ability, indicating she is operating below PLOF. Patient will benefit from skilled PT to address above impairments and improve overall function.  REHAB POTENTIAL: Good  CLINICAL DECISION MAKING: Stable/uncomplicated  EVALUATION COMPLEXITY: Low   GOALS: Goals reviewed with patient? Yes  SHORT TERM GOALS:  STG Name Target Date Goal status  1 Pt will be compliant and knowledgeable with initial HEP for improved comfort and carryover Baseline: initial HEP given 04/10/2021 INITIAL  2 Pt will self report R UE pain no greater than 6/10 for improved comfort and functional ability Baseline: 8/10 at worst 04/10/2021 INITIAL   LONG TERM GOALS:   LTG Name Target Date Goal status  1 Pt will decrease Quick DASH disability score to no greater than 25% as proxy for functional improvement Basline: 50% disability 06/12/2021 INITIAL  2 Pt will self report R UE pain no greater than 3/10 for improved comfort and functional ability Baseline: 8/10 at worst 06/12/2021 INITIAL  3 Pt will improve R shoulder flex/abd no less than 140 deg for improved functional ability with ADLs Baseline: 06/12/2021 INITIAL  4 Pt will increase R UE MMT to no less than 4/5 for all tested motions for improved dynamic stability with overhead motions  Baseline: 06/12/2021 INITIAL   PLAN: PT FREQUENCY: 1x/week  PT DURATION: 3 weeks  PLANNED INTERVENTIONS: Therapeutic exercises, Therapeutic activity, Neuro Muscular re-education, Balance training, Gait training, Patient/Family education, Joint mobilization, Dry Needling, Electrical stimulation, Cryotherapy, Moist heat, Vasopneumatic device, and Manual therapy  PLAN FOR NEXT SESSION: assess HEP response, progress RTC and periscapular strength as able   Ward Chatters 03/20/2021, 10:51 AM

## 2021-03-20 ENCOUNTER — Other Ambulatory Visit: Payer: Self-pay

## 2021-03-20 ENCOUNTER — Ambulatory Visit: Payer: Medicaid Other | Attending: Family Medicine

## 2021-03-20 DIAGNOSIS — M6281 Muscle weakness (generalized): Secondary | ICD-10-CM | POA: Diagnosis not present

## 2021-03-20 DIAGNOSIS — M25511 Pain in right shoulder: Secondary | ICD-10-CM | POA: Diagnosis not present

## 2021-03-20 DIAGNOSIS — G8929 Other chronic pain: Secondary | ICD-10-CM | POA: Insufficient documentation

## 2021-03-20 DIAGNOSIS — M75101 Unspecified rotator cuff tear or rupture of right shoulder, not specified as traumatic: Secondary | ICD-10-CM | POA: Diagnosis not present

## 2021-03-20 NOTE — Addendum Note (Signed)
Addended by: Ward Chatters on: 03/20/2021 01:56 PM   Modules accepted: Orders

## 2021-03-26 ENCOUNTER — Other Ambulatory Visit: Payer: Self-pay

## 2021-03-26 ENCOUNTER — Encounter: Payer: Self-pay | Admitting: Physical Therapy

## 2021-03-26 ENCOUNTER — Ambulatory Visit: Payer: Medicaid Other | Admitting: Physical Therapy

## 2021-03-26 DIAGNOSIS — M6281 Muscle weakness (generalized): Secondary | ICD-10-CM | POA: Diagnosis not present

## 2021-03-26 DIAGNOSIS — M25511 Pain in right shoulder: Secondary | ICD-10-CM

## 2021-03-26 DIAGNOSIS — M75101 Unspecified rotator cuff tear or rupture of right shoulder, not specified as traumatic: Secondary | ICD-10-CM | POA: Diagnosis not present

## 2021-03-26 DIAGNOSIS — G8929 Other chronic pain: Secondary | ICD-10-CM | POA: Diagnosis not present

## 2021-03-26 NOTE — Therapy (Signed)
OUTPATIENT PHYSICAL THERAPY TREATMENT NOTE   Patient Name: Amanda Dickerson MRN: [id number] DOB:07/24/1960, 61 y.o., female Today's Date: 03/26/2021  PCP: Gladys Damme, MD REFERRING PROVIDER: Gladys Damme, MD   PT End of Session - 03/26/21 [phone]     Visit Number 2    Number of Visits 15    Date for PT Re-Evaluation 06/12/21    Authorization Type [place] [facility] - CC[employer]    Authorization Time Period 03/21/21-04/10/21    Authorization - Visit Number 1    Authorization - Number of Visits 3    PT Start Time 0836    PT Stop Time 0920    PT Time Calculation (min) 44 min             Past Medical History:  Diagnosis Date   Anemia    Anxiety    Arthritis    KNEES   Asthma    Colitis    Dysfunctional uterine bleeding    Fibromyalgia    GERD (gastroesophageal reflux disease)    Headache(784.0)    History of cardiovascular stress test    ETT-Myoview 7/16:  EF 71%, no ischemia or infarct; Low Risk   Past Surgical History:  Procedure Laterality Date   CESAREAN SECTION  1988   COLONOSCOPY     ENDOMETRIAL ABLATION W/ NOVASURE  08/2011   MULTIPLE TOOTH EXTRACTIONS     TUBAL LIGATION  2000   Patient Active Problem List   Diagnosis Date Noted   Rotator cuff syndrome of right shoulder 03/06/2021   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 prior episode ([place]) 04/13/2020   GAD (generalized anxiety disorder) 04/13/2020   Grief 04/13/2020   Dyspareunia, female 07/19/2019   Irregular heart beat 05/30/2019   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   Episodic recurrent vertigo 07/26/2015   Hyperlipidemia 07/26/2015   Pre-diabetes 07/26/2015   Obesity 07/26/2015   Gastroesophageal reflux disease 11/29/2013   Asthma, mild intermittent 10/25/2012   Fibromyalgia 07/18/2011  PCP: Gladys Damme, MD   REFERRING PROVIDER: Zenia Resides, MD   REFERRING DIAG:  M75.101 (ICD-10-CM) - Rotator cuff syndrome of right shoulder   THERAPY DIAG:   Chronic right shoulder pain  Muscle weakness (generalized)  PERTINENT HISTORY: Chronic R shoulder pain for last year; also refers into neck and down R UE  PRECAUTIONS: None   WEIGHT BEARING RESTRICTIONS No  ONSET DATE: Chronic     SUBJECTIVE: I have been doing the exercises. I noticed this morning that the pain is worse with all arm movements today. I had trouble getting out of bed this morning.   PAIN:  Are you having pain? Yes NPRS scale: 3/10 (8/10 at worst) Pain location: R shoulder, R UE, R side of neck PAIN TYPE: aching Pain description: intermittent  Aggravating factors: laying on R side, R UE rotation Relieving factors: unsure     OBJECTIVE: taken 03/20/21 unless otherwise noted.   DIAGNOSTIC FINDINGS:  N/A   PATIENT SURVEYS:  Quick Dash 50% disability   COGNITION:          Overall cognitive status: Within functional limits for tasks assessed                               SENSATION:          Light touch: Appears intact   POSTURE: Medium body habitus, rounded shoulders, fwd head   PALPATION: TTP to R upper trap, R infraspinatus  UPPER EXTREMITY AROM/PROM:   AROM Right 03/20/2021 Left 03/20/2021  Shoulder flexion 115 135  Shoulder extension      Shoulder abduction 98* 121  Shoulder internal rotation 15* 20  Shoulder external rotation 90 90  (Blank rows = not tested, score listed is out of 5 possible points.  N = WNL, D = diminished, * = concordant pain with testing)  UPPER EXTREMITY MMT:   MMT Right 03/20/2021 Left 03/20/2021  Shoulder flexion 3/5 4/5  Shoulder abduction (C5) 3/5 4/5  Shoulder ER 3/5 4/5  Shoulder IR 2+/5* 4/5  Grip strength 20# 40#    (Blank rows = not tested, score listed is out of 5 possible points.  N = WNL, D = diminished, C = clear for gross weakness with myotome testing, * = concordant pain with testing)    SHOULDER SPECIAL TESTS:           Impingement tests: Hawkins/Kennedy impingement test: positive  and Painful  arc test: positive             JOINT MOBILITY TESTING:  N/A    TODAY'S TREATMENT:  [facility] Adult PT Treatment:                                                DATE: 03/26/2021 Therapeutic Exercise: Bilat upper trap stretch x 20" x 2 each Bilat  levator stretch x 20" x 2 each  Row x 10 GTB- increased pain Standing scap squeeze x 10- better tolerated  R shoulder IR/ER isometric x 10 - 5", 2 sets each Pulleys flexion and scaption x 1 minute each Supine AAROM - wooden dowel pressups  Supine AAROM- wooden dowel pullovers  Supine AAROM- horizontal abduction/adduction  Manual Therapy: Gentle A/P and inferior glides and passive right shoulder flexion, abduction, IR,ER  Neuromuscular re-ed: N/A Therapeutic Activity: N/A Modalities: N/A Self Care: N/A   Initial  TREATMENT:  [facility] Adult PT Treatment:                                                DATE: 03/20/2021 Therapeutic Exercise: R upper trap stretch x 20" R levator stretch x 20" Row x 10 GTB R shoulder IR/ER isometric x 5 - 5" Manual Therapy: N/A Neuromuscular re-ed: N/A Therapeutic Activity: N/A Modalities: N/A Self Care: N/A       PATIENT EDUCATION: Education details: updated HEP Person educated: Patient Education method: Explanation, Demonstration, and Handouts Education comprehension: verbalized understanding and returned demonstration     HOME EXERCISE PROGRAM: Access Code: [email or web address] URL: [email or web address] Date: 03/26/2021 Prepared by: Hessie Diener  Exercises Seated Upper Trapezius Stretch - 2-3 x daily - 7 x weekly - 2 reps - 20 sec hold Seated Levator Scapulae Stretch - 2-3 x daily - 7 x weekly - 2 reps - 20 sec hold Standing Shoulder Row with Anchored Resistance - 2-3 x daily - 7 x weekly - 3 sets - 10 reps Standing Isometric Shoulder External Rotation with Doorway and Towel Roll - 2-3 x daily - 7 x weekly - 2 sets - 10 reps - 5 sec hold Standing Isometric Shoulder Internal Rotation  with Towel Roll at Doorway - 2-3 x daily - 7 x weekly - 2 sets - 10  reps - 5 sec hold Supine Shoulder Press with Dowel - 1 x daily - 7 x weekly - 3 sets - 10 reps Supine shoulder Flexion Extension AAROM with Dowel to comfortable height overhead - 1 x daily - 7 x weekly - 2 sets - 10 reps Supine Shoulder Horizontal Abduction Adduction AAROM with Dowel - 1 x daily - 7 x weekly - 2 sets - 10 reps Supine Shoulder External Rotation with Dowel - 1 x daily - 7 x weekly - 2 sets - 10 reps    ASSESSMENT:   CLINICAL IMPRESSION: Patient arrives reporting compliance with HEP and continued shoulder pain that is exacerbated with all shoulder movements. Reviewed HEP. Isometric RTC strength still painful so did not progress. Began pulleys and supine AAROM. She tolerated supine AAROM well so this was added to HEP. She was cautioned to discontinue any exercise that causes increased pain.    REHAB POTENTIAL: Good   CLINICAL DECISION MAKING: Stable/uncomplicated   EVALUATION COMPLEXITY: Low     GOALS: Goals reviewed with patient? Yes   SHORT TERM GOALS:   STG Name Target Date Goal status  1 Pt will be compliant and knowledgeable with initial HEP for improved comfort and carryover Baseline: initial HEP given 04/10/2021 INITIAL  2 Pt will self report R UE pain no greater than 6/10 for improved comfort and functional ability Baseline: 8/10 at worst 04/10/2021 INITIAL    LONG TERM GOALS:    LTG Name Target Date Goal status  1 Pt will decrease Quick DASH disability score to no greater than 25% as proxy for functional improvement Basline: 50% disability 06/12/2021 INITIAL  2 Pt will self report R UE pain no greater than 3/10 for improved comfort and functional ability Baseline: 8/10 at worst 06/12/2021 INITIAL  3 Pt will improve R shoulder flex/abd no less than 140 deg for improved functional ability with ADLs Baseline: 06/12/2021 INITIAL  4 Pt will increase R UE MMT to no less than 4/5 for all tested motions  for improved dynamic stability with overhead motions  Baseline: 06/12/2021 INITIAL    PLAN: PT FREQUENCY: 1x/week   PT DURATION: 3 weeks   PLANNED INTERVENTIONS: Therapeutic exercises, Therapeutic activity, Neuro Muscular re-education, Balance training, Gait training, Patient/Family education, Joint mobilization, Dry Needling, Electrical stimulation, Cryotherapy, Moist heat, Vasopneumatic device, and Manual therapy   PLAN FOR NEXT SESSION: assess HEP response, progress RTC and periscapular strength as able      Dorene Ar, PTA 03/26/2021, 9:22 AM

## 2021-04-02 ENCOUNTER — Other Ambulatory Visit: Payer: Self-pay

## 2021-04-02 ENCOUNTER — Encounter: Payer: Self-pay | Admitting: Physical Therapy

## 2021-04-02 ENCOUNTER — Ambulatory Visit: Payer: Medicaid Other | Admitting: Physical Therapy

## 2021-04-02 DIAGNOSIS — G8929 Other chronic pain: Secondary | ICD-10-CM | POA: Diagnosis not present

## 2021-04-02 DIAGNOSIS — M75101 Unspecified rotator cuff tear or rupture of right shoulder, not specified as traumatic: Secondary | ICD-10-CM | POA: Diagnosis not present

## 2021-04-02 DIAGNOSIS — M6281 Muscle weakness (generalized): Secondary | ICD-10-CM | POA: Diagnosis not present

## 2021-04-02 DIAGNOSIS — M25511 Pain in right shoulder: Secondary | ICD-10-CM | POA: Diagnosis not present

## 2021-04-02 NOTE — Therapy (Signed)
OUTPATIENT PHYSICAL THERAPY TREATMENT NOTE   Patient Name: Amanda Dickerson MRN: [id number] DOB:1960-05-02, 61 y.o., female Today's Date: 04/02/2021  PCP: Gladys Damme, MD REFERRING PROVIDER: Gladys Damme, MD   PT End of Session - 04/02/21 [phone]     Visit Number 3    Number of Visits 15    Date for PT Re-Evaluation 06/12/21    Authorization Type [place] [facility] - CCME    Authorization Time Period 03/21/21-04/10/21    Authorization - Visit Number 2    Authorization - Number of Visits 3    PT Start Time 0930    PT Stop Time 1013    PT Time Calculation (min) 43 min             Past Medical History:  Diagnosis Date   Anemia    Anxiety    Arthritis    KNEES   Asthma    Colitis    Dysfunctional uterine bleeding    Fibromyalgia    GERD (gastroesophageal reflux disease)    Headache(784.0)    History of cardiovascular stress test    ETT-Myoview 7/16:  EF 71%, no ischemia or infarct; Low Risk   Past Surgical History:  Procedure Laterality Date   CESAREAN SECTION  1988   COLONOSCOPY     ENDOMETRIAL ABLATION W/ NOVASURE  08/2011   MULTIPLE TOOTH EXTRACTIONS     TUBAL LIGATION  2000   Patient Active Problem List   Diagnosis Date Noted   Rotator cuff syndrome of right shoulder 03/06/2021   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 prior episode ([place]) 04/13/2020   GAD (generalized anxiety disorder) 04/13/2020   Grief 04/13/2020   Dyspareunia, female 07/19/2019   Irregular heart beat 05/30/2019   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   Episodic recurrent vertigo 07/26/2015   Hyperlipidemia 07/26/2015   Pre-diabetes 07/26/2015   Obesity 07/26/2015   Gastroesophageal reflux disease 11/29/2013   Asthma, mild intermittent 10/25/2012   Fibromyalgia 07/18/2011  PCP: Gladys Damme, MD   REFERRING PROVIDER: Zenia Resides, MD   REFERRING DIAG:  M75.101 (ICD-10-CM) - Rotator cuff syndrome of right shoulder   THERAPY DIAG:   Chronic right shoulder pain  Muscle weakness (generalized)  PERTINENT HISTORY: Chronic R shoulder pain for last year; also refers into neck and down R UE  PRECAUTIONS: None   WEIGHT BEARING RESTRICTIONS No  ONSET DATE: Chronic     SUBJECTIVE: I have been doing the exercises. Pain can still reach 8/10. I feel radiating pain into right arm and numbness in right hand   PAIN:  Are you having pain? Yes NPRS scale: 3/10 (8/10 at worst) Pain location: R shoulder, R UE, R side of neck PAIN TYPE: aching Pain description: intermittent  Aggravating factors: laying on R side, R UE rotation Relieving factors: unsure     OBJECTIVE: taken 03/20/21 unless otherwise noted.   DIAGNOSTIC FINDINGS:  N/A   PATIENT SURVEYS:  Quick Dash 50% disability   COGNITION:          Overall cognitive status: Within functional limits for tasks assessed                               SENSATION:          Light touch: Appears intact   POSTURE: Medium body habitus, rounded shoulders, fwd head   PALPATION: TTP to R upper trap, R infraspinatus   UPPER EXTREMITY AROM/PROM:  AROM Right 03/20/2021 Left 03/20/2021 Right 04/02/21  Shoulder flexion 115 135 130  Shoulder extension       Shoulder abduction 98* 121 110  Shoulder internal rotation 15* 20 Reach to sacrum  Shoulder external rotation 90 90 Reach t2  (Blank rows = not tested, score listed is out of 5 possible points.  N = WNL, D = diminished, * = concordant pain with testing)  UPPER EXTREMITY MMT:   MMT Right 03/20/2021 Left 03/20/2021  Shoulder flexion 3/5 4/5  Shoulder abduction (C5) 3/5 4/5  Shoulder ER 3/5 4/5  Shoulder IR 2+/5* 4/5  Grip strength 20# 40#    (Blank rows = not tested, score listed is out of 5 possible points.  N = WNL, D = diminished, C = clear for gross weakness with myotome testing, * = concordant pain with testing)    SHOULDER SPECIAL TESTS:           Impingement tests: Hawkins/Kennedy impingement test:  positive  and Painful arc test: positive             JOINT MOBILITY TESTING:  N/A    TODAY'S TREATMENT:   [facility] Adult PT Treatment:                                                DATE: 04/02/2021 Therapeutic Exercise: Bilat upper trap stretch x 20" x 2 each Bilat  levator stretch x 20" x 2 each  Row x 10 RTB-  R shoulder IR/ER YTB 10 -  2 sets each Pulleys flexion and scaption x 1 minute each Trigger point Dry Needling:  Right upper trap x 2 , Samantha [place], DPT  Manual Therapy: Gentle A/P and inferior glides and passive right shoulder flexion, abduction, IR,ER  STW to right upper trap following TPDN  Neuromuscular re-ed: N/A Therapeutic Activity: N/A Modalities: N/A Self Care: N/A  [facility] Adult PT Treatment:                                                DATE: 03/26/2021 Therapeutic Exercise: Bilat upper trap stretch x 20" x 2 each Bilat  levator stretch x 20" x 2 each  Row x 10 GTB- increased pain Standing scap squeeze x 10- better tolerated  R shoulder IR/ER isometric x 10 - 5", 2 sets each Pulleys flexion and scaption x 1 minute each Supine AAROM - wooden dowel pressups  Supine AAROM- wooden dowel pullovers  Supine AAROM- horizontal abduction/adduction  Manual Therapy: Gentle A/P and inferior glides and passive right shoulder flexion, abduction, IR,ER  Neuromuscular re-ed: N/A Therapeutic Activity: N/A Modalities: N/A Self Care: N/A   Initial  TREATMENT:  [facility] Adult PT Treatment:                                                DATE: 03/20/2021 Therapeutic Exercise: R upper trap stretch x 20" R levator stretch x 20" Row x 10 GTB R shoulder IR/ER isometric x 5 - 5" Manual Therapy: N/A Neuromuscular re-ed: N/A Therapeutic Activity: N/A Modalities: N/A Self Care: N/A  PATIENT EDUCATION: Education details: updated HEP, TPDN handout  Person educated: Patient Education method: Explanation, Demonstration, and Handouts Education comprehension:  verbalized understanding and returned demonstration     HOME EXERCISE PROGRAM: Access Code: [email or web address] URL: [email or web address] Date: 04/02/2021 Prepared by: Hessie Diener  Exercises Seated Upper Trapezius Stretch - 2-3 x daily - 7 x weekly - 2 reps - 20 sec hold Seated Levator Scapulae Stretch - 2-3 x daily - 7 x weekly - 2 reps - 20 sec hold Standing Shoulder Row with Anchored Resistance - 2-3 x daily - 7 x weekly - 3 sets - 10 reps Standing Isometric Shoulder External Rotation with Doorway and Towel Roll - 2-3 x daily - 7 x weekly - 2 sets - 10 reps - 5 sec hold Standing Isometric Shoulder Internal Rotation with Towel Roll at Doorway - 2-3 x daily - 7 x weekly - 2 sets - 10 reps - 5 sec hold Supine Shoulder Press with Dowel - 1 x daily - 7 x weekly - 3 sets - 10 reps Supine shoulder Flexion Extension AAROM with Dowel to comfortable height overhead - 1 x daily - 7 x weekly - 2 sets - 10 reps Supine Shoulder Horizontal Abduction Adduction AAROM with Dowel - 1 x daily - 7 x weekly - 2 sets - 10 reps Supine Shoulder External Rotation with Dowel - 1 x daily - 7 x weekly - 2 sets - 10 reps Shoulder External Rotation with Anchored Resistance - 1 x daily - 7 x weekly - 2 sets - 10 reps Shoulder Internal Rotation with Resistance - 1 x daily - 7 x weekly - 2 sets - 10 reps  ASSESSMENT:   CLINICAL IMPRESSION: Patient arrives reporting compliance with HEP and continued shoulder pain that radiates into right arm and numbness in right hand. Her AROM has improved in most planes. Able to progress RTC strength to gentle bands today. Karsten Fells , DPT available to perform TPDN to right upper trap.   Continued  pulleys and Prom with good tolerance.  She was instructed in TPDN aftercare and felt soreness after session.    REHAB POTENTIAL: Good   CLINICAL DECISION MAKING: Stable/uncomplicated   EVALUATION COMPLEXITY: Low     GOALS: Goals reviewed with patient? Yes   SHORT TERM GOALS:    STG Name Target Date Goal status  1 Pt will be compliant and knowledgeable with initial HEP for improved comfort and carryover Baseline: initial HEP given 04/10/2021 INITIAL  2 Pt will self report R UE pain no greater than 6/10 for improved comfort and functional ability Baseline: 8/10 at worst 04/10/2021 INITIAL    LONG TERM GOALS:    LTG Name Target Date Goal status  1 Pt will decrease Quick DASH disability score to no greater than 25% as proxy for functional improvement Basline: 50% disability 06/12/2021 INITIAL  2 Pt will self report R UE pain no greater than 3/10 for improved comfort and functional ability Baseline: 8/10 at worst 06/12/2021 INITIAL  3 Pt will improve R shoulder flex/abd no less than 140 deg for improved functional ability with ADLs Baseline: 06/12/2021 INITIAL  4 Pt will increase R UE MMT to no less than 4/5 for all tested motions for improved dynamic stability with overhead motions  Baseline: 06/12/2021 INITIAL    PLAN: PT FREQUENCY: 1x/week   PT DURATION: 3 weeks   PLANNED INTERVENTIONS: Therapeutic exercises, Therapeutic activity, Neuro Muscular re-education, Balance training, Gait training, Patient/Family education, Joint mobilization, Dry Needling, Electrical stimulation, Cryotherapy,  Moist heat, Vasopneumatic device, and Manual therapy   PLAN FOR NEXT SESSION: assess response to TPDN and repeat PRN. assess HEP response, progress RTC and periscapular strength as able      Dorene Ar, PTA 04/02/2021, 10:19 AM

## 2021-04-02 NOTE — Patient Instructions (Signed)

## 2021-04-08 ENCOUNTER — Other Ambulatory Visit: Payer: Self-pay

## 2021-04-08 ENCOUNTER — Ambulatory Visit: Payer: Medicaid Other | Attending: Family Medicine

## 2021-04-08 DIAGNOSIS — G8929 Other chronic pain: Secondary | ICD-10-CM | POA: Insufficient documentation

## 2021-04-08 DIAGNOSIS — M6281 Muscle weakness (generalized): Secondary | ICD-10-CM | POA: Diagnosis not present

## 2021-04-08 DIAGNOSIS — M25511 Pain in right shoulder: Secondary | ICD-10-CM | POA: Insufficient documentation

## 2021-04-08 NOTE — Therapy (Signed)
OUTPATIENT PHYSICAL THERAPY TREATMENT NOTE   Patient Name: Amanda Dickerson MRN: [id number] DOB:05-29-1960, 61 y.o., female Today's Date: 04/08/2021  PCP: Gladys Damme, MD REFERRING PROVIDER: Gladys Damme, MD   PT End of Session - 04/08/21 1023     Visit Number 4    Number of Visits 15    Date for PT Re-Evaluation 06/12/21    Authorization Type [place] [facility] - [facility]    Authorization Time Period 03/21/21-04/10/21    Authorization - Visit Number 3    Authorization - Number of Visits 3    PT Start Time 1030    PT Stop Time 1112    PT Time Calculation (min) 42 min              Past Medical History:  Diagnosis Date   Anemia    Anxiety    Arthritis    KNEES   Asthma    Colitis    Dysfunctional uterine bleeding    Fibromyalgia    GERD (gastroesophageal reflux disease)    Headache(784.0)    History of cardiovascular stress test    ETT-Myoview 7/16:  EF 71%, no ischemia or infarct; Low Risk   Past Surgical History:  Procedure Laterality Date   CESAREAN SECTION  1988   COLONOSCOPY     ENDOMETRIAL ABLATION W/ NOVASURE  08/2011   MULTIPLE TOOTH EXTRACTIONS     TUBAL LIGATION  2000   Patient Active Problem List   Diagnosis Date Noted   Rotator cuff syndrome of right shoulder 03/06/2021   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 prior episode ([place]) 04/13/2020   GAD (generalized anxiety disorder) 04/13/2020   Grief 04/13/2020   Dyspareunia, female 07/19/2019   Irregular heart beat 05/30/2019   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   Episodic recurrent vertigo 07/26/2015   Hyperlipidemia 07/26/2015   Pre-diabetes 07/26/2015   Obesity 07/26/2015   Gastroesophageal reflux disease 11/29/2013   Asthma, mild intermittent 10/25/2012   Fibromyalgia 07/18/2011  PCP: Gladys Damme, MD   REFERRING PROVIDER: Zenia Resides, MD   REFERRING DIAG:  M75.101 (ICD-10-CM) - Rotator cuff syndrome of right shoulder   THERAPY DIAG:   Chronic right shoulder pain  Muscle weakness (generalized)  PERTINENT HISTORY: Chronic R shoulder pain for last year; also refers into neck and down R UE  PRECAUTIONS: None   WEIGHT BEARING RESTRICTIONS No  ONSET DATE: Chronic   SUBJECTIVE:  Pt presents to PT with reports of continued R sided neck and shoulder pain. Has been compliant with HEP with no sharp increases in pain, but does have some discomfort with RTC strengthening. Felt like she responded well to TPDN last session, but wants to hold off as the needles make her apprehensive. She is ready to begin PT at this time.  PAIN:  Are you having pain? Yes NPRS scale: 3/10 (8/10 at worst) Pain location: R shoulder, R UE, R side of neck PAIN TYPE: aching Pain description: intermittent  Aggravating factors: laying on R side, R UE rotation Relieving factors: unsure     OBJECTIVE: taken 03/20/21 unless otherwise noted.  PATIENT SURVEYS:  Quick Dash 52% disability - 04/08/2021   UPPER EXTREMITY AROM/PROM:   AROM Right 03/20/2021 Left 03/20/2021 Right 04/02/21 Right 04/08/2021  Shoulder flexion 115 135 130 130  Shoulder extension        Shoulder abduction 98* 121 110 92 p!  Shoulder internal rotation 15* 20 Reach to sacrum Reach to sacrum  Shoulder external rotation 90 90 Reach  t2 Reach t2  (Blank rows = not tested, score listed is out of 5 possible points.  N = WNL, D = diminished, * = concordant pain with testing)  UPPER EXTREMITY MMT:   MMT Right 03/20/2021 Right 04/08/2021  Shoulder flexion 3/5 3/5  Shoulder abduction (C5) 3/5 3/5  Shoulder ER 3/5 3/5  Shoulder IR 2+/5* 3/5  Grip strength 20# 20#    (Blank rows = not tested, score listed is out of 5 possible points.  N = WNL, D = diminished, C = clear for gross weakness with myotome testing, * = concordant pain with testing)     TODAY'S TREATMENT:   [facility] Adult PT Treatment:                                                DATE: 04/08/2021 Therapeutic Exercise: Row  2x10 RTB R shoulder IR/ER YTB 10 -  2 sets each Supine horizontal abd 2x10 YTB Supine physioball raise 2x10 AAROM Pulleys flexion and scaption x 1 minute each Manual Therapy: Gentle A/P and inferior glides and passive right shoulder flexion, abduction, IR,ER  STW to right upper trap following TPDN  Neuromuscular re-ed: N/A Therapeutic Activity: N/A Modalities: N/A Self Care: N/A  [facility] Adult PT Treatment:                                                DATE: 04/02/2021 Therapeutic Exercise: Bilat upper trap stretch x 20" x 2 each Bilat  levator stretch x 20" x 2 each  Row x 10 RTB-  R shoulder IR/ER YTB 10 -  2 sets each Pulleys flexion and scaption x 1 minute each Trigger point Dry Needling:  Right upper trap x 2 , Samantha P[place]a, DPT  Manual Therapy: Gentle A/P and inferior glides and passive right shoulder flexion, abduction, IR,ER  STW to right upper trap following TPDN  Neuromuscular re-ed: N/A Therapeutic Activity: N/A Modalities: N/A Self Care: N/A  [facility] Adult PT Treatment:                                                DATE: 03/26/2021 Therapeutic Exercise: Bilat upper trap stretch x 20" x 2 each Bilat  levator stretch x 20" x 2 each  Row x 10 GTB- increased pain Standing scap squeeze x 10- better tolerated  R shoulder IR/ER isometric x 10 - 5", 2 sets each Pulleys flexion and scaption x 1 minute each Supine AAROM - wooden dowel pressups  Supine AAROM- wooden dowel pullovers  Supine AAROM- horizontal abduction/adduction  Manual Therapy: Gentle A/P and inferior glides and passive right shoulder flexion, abduction, IR,ER  Neuromuscular re-ed: N/A Therapeutic Activity: N/A Modalities: N/A Self Care: N/A    PATIENT EDUCATION: Education details: continue HEP Person educated: Patient Education method: Explanation, Demonstration, and Handouts Education comprehension: verbalized understanding and returned demonstration     HOME EXERCISE PROGRAM: Access  Code: [email or web address] URL: [email or web address] Date: 04/02/2021 Prepared by: Hessie Diener  Exercises Seated Upper Trapezius Stretch - 2-3 x daily - 7 x weekly - 2 reps - 20 sec hold Seated Levator  Scapulae Stretch - 2-3 x daily - 7 x weekly - 2 reps - 20 sec hold Standing Shoulder Row with Anchored Resistance - 2-3 x daily - 7 x weekly - 3 sets - 10 reps Standing Isometric Shoulder External Rotation with Doorway and Towel Roll - 2-3 x daily - 7 x weekly - 2 sets - 10 reps - 5 sec hold Standing Isometric Shoulder Internal Rotation with Towel Roll at Doorway - 2-3 x daily - 7 x weekly - 2 sets - 10 reps - 5 sec hold Supine Shoulder Press with Dowel - 1 x daily - 7 x weekly - 3 sets - 10 reps Supine shoulder Flexion Extension AAROM with Dowel to comfortable height overhead - 1 x daily - 7 x weekly - 2 sets - 10 reps Supine Shoulder Horizontal Abduction Adduction AAROM with Dowel - 1 x daily - 7 x weekly - 2 sets - 10 reps Supine Shoulder External Rotation with Dowel - 1 x daily - 7 x weekly - 2 sets - 10 reps Shoulder External Rotation with Anchored Resistance - 1 x daily - 7 x weekly - 2 sets - 10 reps Shoulder Internal Rotation with Resistance - 1 x daily - 7 x weekly - 2 sets - 10 reps  ASSESSMENT:   CLINICAL IMPRESSION: Pt was able to complete all prescribed exercises with no adverse effect. She has progressed with therapy, showing improved R shoulder flexion ROM compared to initial evaluation and slight increase in strength. However, she has continued significant decrease in functional ability and performance of ADLs, as noted by  continued severe disability noted on Quick DASH questionnaire. Pt does continue to require skilled PT services working on improving RTC and periscapular strength as well as manual therapy interventions for decreasing pain and soft tissue irritation. PT will continue to progress exercises as tolerated per POC as prescribed.      GOALS: Goals reviewed  with patient? Yes   SHORT TERM GOALS:   STG Name Target Date Goal status  1 Pt will be compliant and knowledgeable with initial HEP for improved comfort and carryover Baseline: initial HEP given 04/10/2021 MET  2 Pt will self report R UE pain no greater than 6/10 for improved comfort and functional ability Baseline: 8/10 at worst 04/10/2021 PARTIALLY MET    LONG TERM GOALS:    LTG Name Target Date Goal status  1 Pt will decrease Quick DASH disability score to no greater than 25% as proxy for functional improvement Basline: 50% disability 04/08/2021: 52% disability 06/12/2021 ONGOING  2 Pt will self report R UE pain no greater than 3/10 for improved comfort and functional ability Baseline: 8/10 at worst 06/12/2021 ONGOING  3 Pt will improve R shoulder flex/abd no less than 140 deg for improved functional ability with ADLs Baseline: see chart 06/12/2021 ONGOING  4 Pt will increase R UE MMT to no less than 4/5 for all tested motions for improved dynamic stability with overhead motions  Baseline: see chart 06/12/2021 ONGOING    PLAN: PT FREQUENCY: 1x/week   PT DURATION: 8 weeks   PLANNED INTERVENTIONS: Therapeutic exercises, Therapeutic activity, Neuro Muscular re-education, Balance training, Gait training, Patient/Family education, Joint mobilization, Dry Needling, Electrical stimulation, Cryotherapy, Moist heat, Vasopneumatic device, and Manual therapy   PLAN FOR NEXT SESSION: assess response to TPDN and repeat PRN. assess HEP response, progress RTC and periscapular strength as able      Ward Chatters, PT 04/08/2021, 11:22 AM

## 2021-04-16 ENCOUNTER — Ambulatory Visit: Payer: Medicaid Other | Admitting: Physical Therapy

## 2021-04-30 ENCOUNTER — Ambulatory Visit: Payer: Medicaid Other

## 2021-04-30 ENCOUNTER — Other Ambulatory Visit: Payer: Self-pay

## 2021-04-30 ENCOUNTER — Other Ambulatory Visit: Payer: Self-pay | Admitting: Family Medicine

## 2021-04-30 DIAGNOSIS — G8929 Other chronic pain: Secondary | ICD-10-CM

## 2021-04-30 DIAGNOSIS — R232 Flushing: Secondary | ICD-10-CM

## 2021-04-30 DIAGNOSIS — M6281 Muscle weakness (generalized): Secondary | ICD-10-CM

## 2021-04-30 DIAGNOSIS — M25511 Pain in right shoulder: Secondary | ICD-10-CM

## 2021-04-30 NOTE — Therapy (Signed)
OUTPATIENT PHYSICAL THERAPY TREATMENT NOTE   Patient Name: Amanda Dickerson MRN: [id number] DOB:04/06/1960, 61 y.o., female Today's Date: 04/30/2021  PCP: Gladys Damme, MD REFERRING PROVIDER: Gladys Damme, MD   PT End of Session - 04/30/21 [phone]     Visit Number 5    Number of Visits 15    Date for PT Re-Evaluation 06/12/21    Authorization Type [place] [facility] - [facility]ME    Authorization Time Period 04/11/2021-06/07/2021    Authorization - Visit Number 1    Authorization - Number of Visits 8    PT Start Time 0912    PT Stop Time 0952    PT Time Calculation (min) 40 min    Activity Tolerance Patient tolerated treatment well    Behavior During Therapy [facility] for tasks assessed/performed               Past Medical History:  Diagnosis Date   Anemia    Anxiety    Arthritis    KNEES   Asthma    Colitis    Dysfunctional uterine bleeding    Fibromyalgia    GERD (gastroesophageal reflux disease)    Headache(784.0)    History of cardiovascular stress test    ETT-Myoview 7/16:  EF 71%, no ischemia or infarct; Low Risk   Past Surgical History:  Procedure Laterality Date   CESAREAN SECTION  1988   COLONOSCOPY     ENDOMETRIAL ABLATION W/ NOVASURE  08/2011   MULTIPLE TOOTH EXTRACTIONS     TUBAL LIGATION  2000   Patient Active Problem List   Diagnosis Date Noted   Rotator cuff syndrome of right shoulder 03/06/2021   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 prior episode ([place]) 04/13/2020   GAD (generalized anxiety disorder) 04/13/2020   Grief 04/13/2020   Dyspareunia, female 07/19/2019   Irregular heart beat 05/30/2019   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   Episodic recurrent vertigo 07/26/2015   Hyperlipidemia 07/26/2015   Pre-diabetes 07/26/2015   Obesity 07/26/2015   Gastroesophageal reflux disease 11/29/2013   Asthma, mild intermittent 10/25/2012   Fibromyalgia 07/18/2011  PCP: Gladys Damme, MD   REFERRING PROVIDER:  Zenia Resides, MD   REFERRING DIAG:  M75.101 (ICD-10-CM) - Rotator cuff syndrome of right shoulder   THERAPY DIAG:  Chronic right shoulder pain  Muscle weakness (generalized)  PERTINENT HISTORY: Chronic R shoulder pain for last year; also refers into neck and down R UE  PRECAUTIONS: None   WEIGHT BEARING RESTRICTIONS No  ONSET DATE: Chronic  SUBJECTIVE:  Pt presents to PT with continued reports of R shoulder and upper trap discomfort.   Pain: Are you having pain? Yes NPRS scale: 3/10 (8/10 at worst) Pain location: R shoulder, R UE, R side of neck PAIN TYPE: aching Pain description: intermittent  Aggravating factors: laying on R side, R UE rotation Relieving factors: unsure     OBJECTIVE: taken 03/20/21 unless otherwise noted.  PATIENT SURVEYS:  Quick Dash 52% disability - 04/08/2021   UPPER EXTREMITY AROM/PROM:   AROM Right 03/20/2021 Left 03/20/2021 Right 04/02/21 Right 04/08/2021  Shoulder flexion 115 135 130 130  Shoulder extension        Shoulder abduction 98* 121 110 92 p!  Shoulder internal rotation 15* 20 Reach to sacrum Reach to sacrum  Shoulder external rotation 90 90 Reach t2 Reach t2  (Blank rows = not tested, score listed is out of 5 possible points.  N = WNL, D = diminished, * = concordant pain  with testing)  UPPER EXTREMITY MMT:   MMT Right 03/20/2021 Right 04/08/2021  Shoulder flexion 3/5 3/5  Shoulder abduction (C5) 3/5 3/5  Shoulder ER 3/5 3/5  Shoulder IR 2+/5* 3/5  Grip strength 20# 20#    (Blank rows = not tested, score listed is out of 5 possible points.  N = WNL, D = diminished, C = clear for gross weakness with myotome testing, * = concordant pain with testing)     TODAY'S TREATMENT:   [facility] Adult PT Treatment:                                                DATE: 04/30/2021 Therapeutic Exercise: UBE lvl 1.0 x 4 min (28fd/2bwd) while taking subjective Seated row 2x10 15# Shoulder ext with scap retraction 2x10 black TB Supine  horizontal abd 3x10 GTB Supine D2 flexion YTB R 2x10 Supine physioball raise 2x10 AAROM Seated bilateral ER 2x10 YTB Serratus raise with foam roll x 10 YTB Pulleys flexion and scaption x 1 minute each Manual Therapy: Trigger point release to R upper trap Positional release to R upper trap Manual cervical traction  [facility] Adult PT Treatment:                                                DATE: 04/08/2021 Therapeutic Exercise: Row 2x10 RTB R shoulder IR/ER YTB 10 -  2 sets each Supine horizontal abd 2x10 YTB Supine physioball raise 2x10 AAROM Pulleys flexion and scaption x 1 minute each Manual Therapy: Gentle A/P and inferior glides and passive right shoulder flexion, abduction, IR,ER  STW to right upper trap following TPDN    PATIENT EDUCATION: Education details: continue HEP Person educated: Patient Education method: Explanation, Demonstration, and Handouts Education comprehension: verbalized understanding and returned demonstration     HOME EXERCISE PROGRAM: Access Code: [email or web address]: [email or web address] Date: 04/02/2021 Prepared by: JHessie Diener Exercises Seated Upper Trapezius Stretch - 2-3 x daily - 7 x weekly - 2 reps - 20 sec hold Seated Levator Scapulae Stretch - 2-3 x daily - 7 x weekly - 2 reps - 20 sec hold Standing Shoulder Row with Anchored Resistance - 2-3 x daily - 7 x weekly - 3 sets - 10 reps Standing Isometric Shoulder External Rotation with Doorway and Towel Roll - 2-3 x daily - 7 x weekly - 2 sets - 10 reps - 5 sec hold Standing Isometric Shoulder Internal Rotation with Towel Roll at Doorway - 2-3 x daily - 7 x weekly - 2 sets - 10 reps - 5 sec hold Supine Shoulder Press with Dowel - 1 x daily - 7 x weekly - 3 sets - 10 reps Supine shoulder Flexion Extension AAROM with Dowel to comfortable height overhead - 1 x daily - 7 x weekly - 2 sets - 10 reps Supine Shoulder Horizontal Abduction Adduction AAROM with Dowel - 1 x daily - 7 x weekly - 2  sets - 10 reps Supine Shoulder External Rotation with Dowel - 1 x daily - 7 x weekly - 2 sets - 10 reps Shoulder External Rotation with Anchored Resistance - 1 x daily - 7 x weekly - 2 sets - 10 reps Shoulder Internal Rotation with Resistance - 1  x daily - 7 x weekly - 2 sets - 10 reps  ASSESSMENT: Pt was able to complete all prescribed exercises with no adverse effect or increase in pain. Therapy today focused on improving periscapular strength along with manual therapy for decreasing pain. She responded well to manual therapy interventions, noting decreased pain post session. Pt continues to benefit from skilled PT services and will continue to be seen and progressed as tolerated.      GOALS: Goals reviewed with patient? Yes   SHORT TERM GOALS:   STG Name Target Date Goal status  1 Pt will be compliant and knowledgeable with initial HEP for improved comfort and carryover Baseline: initial HEP given 04/10/2021 MET  2 Pt will self report R UE pain no greater than 6/10 for improved comfort and functional ability Baseline: 8/10 at worst 04/10/2021 PARTIALLY MET    LONG TERM GOALS:    LTG Name Target Date Goal status  1 Pt will decrease Quick DASH disability score to no greater than 25% as proxy for functional improvement Basline: 50% disability 04/08/2021: 52% disability 06/12/2021 ONGOING  2 Pt will self report R UE pain no greater than 3/10 for improved comfort and functional ability Baseline: 8/10 at worst 06/12/2021 ONGOING  3 Pt will improve R shoulder flex/abd no less than 140 deg for improved functional ability with ADLs Baseline: see chart 06/12/2021 ONGOING  4 Pt will increase R UE MMT to no less than 4/5 for all tested motions for improved dynamic stability with overhead motions  Baseline: see chart 06/12/2021 ONGOING    PLAN: PT FREQUENCY: 1x/week   PT DURATION: 8 weeks   PLANNED INTERVENTIONS: Therapeutic exercises, Therapeutic activity, Neuro Muscular re-education, Balance  training, Gait training, Patient/Family education, Joint mobilization, Dry Needling, Electrical stimulation, Cryotherapy, Moist heat, Vasopneumatic device, and Manual therapy   PLAN FOR NEXT SESSION: assess response to TPDN and repeat PRN. assess HEP response, progress RTC and periscapular strength as able      Ward Chatters, PT 04/30/2021, 10:11 AM

## 2021-05-07 ENCOUNTER — Ambulatory Visit: Payer: Medicaid Other | Attending: Family Medicine

## 2021-05-07 ENCOUNTER — Other Ambulatory Visit: Payer: Self-pay

## 2021-05-07 DIAGNOSIS — M6281 Muscle weakness (generalized): Secondary | ICD-10-CM | POA: Diagnosis not present

## 2021-05-07 DIAGNOSIS — M25511 Pain in right shoulder: Secondary | ICD-10-CM | POA: Insufficient documentation

## 2021-05-07 DIAGNOSIS — G8929 Other chronic pain: Secondary | ICD-10-CM | POA: Diagnosis not present

## 2021-05-07 NOTE — Therapy (Signed)
OUTPATIENT PHYSICAL THERAPY TREATMENT NOTE   Patient Name: Amanda Dickerson MRN: [id number] DOB:04-20-1960, 61 y.o., female Today's Date: 05/07/2021  PCP: Gladys Damme, MD REFERRING PROVIDER: Gladys Damme, MD   PT End of Session - 05/07/21 0902     Visit Number 6    Number of Visits 15    Date for PT Re-Evaluation 06/12/21    Authorization Type [place] MCD - CCME    Authorization Time Period 04/11/2021-06/07/2021    Authorization - Visit Number 2    Authorization - Number of Visits 8    PT Start Time 0905    PT Stop Time 0945    PT Time Calculation (min) 40 min    Activity Tolerance Patient tolerated treatment well    Behavior During Therapy [facility] for tasks assessed/performed                Past Medical History:  Diagnosis Date   Anemia    Anxiety    Arthritis    KNEES   Asthma    Colitis    Dysfunctional uterine bleeding    Fibromyalgia    GERD (gastroesophageal reflux disease)    Headache(784.0)    History of cardiovascular stress test    ETT-Myoview 7/16:  EF 71%, no ischemia or infarct; Low Risk   Past Surgical History:  Procedure Laterality Date   CESAREAN SECTION  1988   COLONOSCOPY     ENDOMETRIAL ABLATION W/ NOVASURE  08/2011   MULTIPLE TOOTH EXTRACTIONS     TUBAL LIGATION  2000   Patient Active Problem List   Diagnosis Date Noted   Rotator cuff syndrome of right shoulder 03/06/2021   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 prior episode ([place]) 04/13/2020   GAD (generalized anxiety disorder) 04/13/2020   Grief 04/13/2020   Dyspareunia, female 07/19/2019   Irregular heart beat 05/30/2019   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   Episodic recurrent vertigo 07/26/2015   Hyperlipidemia 07/26/2015   Pre-diabetes 07/26/2015   Obesity 07/26/2015   Gastroesophageal reflux disease 11/29/2013   Asthma, mild intermittent 10/25/2012   Fibromyalgia 07/18/2011  PCP: Gladys Damme, MD   REFERRING PROVIDER:  Zenia Resides, MD   REFERRING DIAG:  M75.101 (ICD-10-CM) - Rotator cuff syndrome of right shoulder   THERAPY DIAG:  Chronic right shoulder pain  Muscle weakness (generalized)  PERTINENT HISTORY: Chronic R shoulder pain for last year; also refers into neck and down R UE  PRECAUTIONS: None   WEIGHT BEARING RESTRICTIONS No  ONSET DATE: Chronic  SUBJECTIVE:  Pt presents to PT with reports of slightly decreased pain today, although she had sharp increase in pain after her last PT session. Pt has not been as compliant with HEP due to pain. Pt is ready to begin PT at this time.   Pain: Are you having pain? Yes NPRS scale: 2/10 (8/10 at worst) Pain location: R shoulder, R UE, R side of neck PAIN TYPE: aching Pain description: intermittent  Aggravating factors: laying on R side, R UE rotation Relieving factors: unsure     OBJECTIVE: taken 03/20/21 unless otherwise noted.  PATIENT SURVEYS:  Quick Dash 52% disability - 04/08/2021   UPPER EXTREMITY AROM/PROM:   AROM Right 03/20/2021 Left 03/20/2021 Right 04/02/21 Right 04/08/2021  Shoulder flexion 115 135 130 130  Shoulder extension        Shoulder abduction 98* 121 110 92 p!  Shoulder internal rotation 15* 20 Reach to sacrum Reach to sacrum  Shoulder external rotation 90 90  Reach t2 Reach t2  (Blank rows = not tested, score listed is out of 5 possible points.  N = WNL, D = diminished, * = concordant pain with testing)  UPPER EXTREMITY MMT:   MMT Right 03/20/2021 Right 04/08/2021  Shoulder flexion 3/5 3/5  Shoulder abduction (C5) 3/5 3/5  Shoulder ER 3/5 3/5  Shoulder IR 2+/5* 3/5  Grip strength 20# 20#    (Blank rows = not tested, score listed is out of 5 possible points.  N = WNL, D = diminished, C = clear for gross weakness with myotome testing, * = concordant pain with testing)     TODAY'S TREATMENT:  [facility] Adult PT Treatment:                                                DATE: 05/07/2021 Therapeutic Exercise: UBE  lvl 1.0 x 4 min (54fd/2bwd) while taking subjective Seated row 2x10 black TB Shoulder ext with scap retraction 2x10 black TB Seated horizontal abd 2x15 GTB Seated serratus hug 2x10 YTB Supine D2 flexion YTB R 2x10 Supine bilateral ER YTB x 10 Supine physioball raise 2x10 AAROM Seated bilateral ER 2x10 YTB Serratus raise with foam roll x 10 YTB Pulleys flexion and scaption x 1 minute each Manual Therapy: Trigger point release to R upper trap Positional release to R upper trap  [facility] Adult PT Treatment:                                                DATE: 04/30/2021 Therapeutic Exercise: UBE lvl 1.0 x 4 min (236f/2bwd) while taking subjective Seated row 2x10 15# Shoulder ext with scap retraction 2x10 black TB Supine horizontal abd 3x10 GTB Supine D2 flexion YTB R 2x10 Supine physioball raise 2x10 AAROM Seated bilateral ER 2x10 YTB Serratus raise with foam roll x 10 YTB Pulleys flexion and scaption x 1 minute each Manual Therapy: Trigger point release to R upper trap Positional release to R upper trap Manual cervical traction  PATIENT EDUCATION: Education details: continue HEP Person educated: Patient Education method: Explanation, Demonstration, and Handouts Education comprehension: verbalized understanding and returned demonstration     HOME EXERCISE PROGRAM: Access Code: [email or web address]: [email or web address] Date: 04/02/2021 Prepared by: JeHessie DienerExercises Seated Upper Trapezius Stretch - 2-3 x daily - 7 x weekly - 2 reps - 20 sec hold Seated Levator Scapulae Stretch - 2-3 x daily - 7 x weekly - 2 reps - 20 sec hold Standing Shoulder Row with Anchored Resistance - 2-3 x daily - 7 x weekly - 3 sets - 10 reps Standing Isometric Shoulder External Rotation with Doorway and Towel Roll - 2-3 x daily - 7 x weekly - 2 sets - 10 reps - 5 sec hold Standing Isometric Shoulder Internal Rotation with Towel Roll at Doorway - 2-3 x daily - 7 x weekly - 2 sets - 10  reps - 5 sec hold Supine Shoulder Press with Dowel - 1 x daily - 7 x weekly - 3 sets - 10 reps Supine shoulder Flexion Extension AAROM with Dowel to comfortable height overhead - 1 x daily - 7 x weekly - 2 sets - 10 reps Supine Shoulder Horizontal Abduction Adduction AAROM with Dowel - 1  x daily - 7 x weekly - 2 sets - 10 reps Supine Shoulder External Rotation with Dowel - 1 x daily - 7 x weekly - 2 sets - 10 reps Shoulder External Rotation with Anchored Resistance - 1 x daily - 7 x weekly - 2 sets - 10 reps Shoulder Internal Rotation with Resistance - 1 x daily - 7 x weekly - 2 sets - 10 reps  ASSESSMENT: Pt was again able to complete all prescribed exercises with no adverse effect or increase in pain. Therapy today continued to focus on improving periscapular strength and endurance. She once again responded well to manual therapy interventions, reporting decreased pain post session. Pt continues to benefit from skilled PT services working on previously described areas. Will continue to progress as able per POC.     GOALS: Goals reviewed with patient? Yes   SHORT TERM GOALS:   STG Name Target Date Goal status  1 Pt will be compliant and knowledgeable with initial HEP for improved comfort and carryover Baseline: initial HEP given 04/10/2021 MET  2 Pt will self report R UE pain no greater than 6/10 for improved comfort and functional ability Baseline: 8/10 at worst 04/10/2021 PARTIALLY MET    LONG TERM GOALS:    LTG Name Target Date Goal status  1 Pt will decrease Quick DASH disability score to no greater than 25% as proxy for functional improvement Basline: 50% disability 04/08/2021: 52% disability 06/12/2021 ONGOING  2 Pt will self report R UE pain no greater than 3/10 for improved comfort and functional ability Baseline: 8/10 at worst 06/12/2021 ONGOING  3 Pt will improve R shoulder flex/abd no less than 140 deg for improved functional ability with ADLs Baseline: see chart 06/12/2021 ONGOING   4 Pt will increase R UE MMT to no less than 4/5 for all tested motions for improved dynamic stability with overhead motions  Baseline: see chart 06/12/2021 ONGOING    PLAN: PT FREQUENCY: 1x/week   PT DURATION: 8 weeks   PLANNED INTERVENTIONS: Therapeutic exercises, Therapeutic activity, Neuro Muscular re-education, Balance training, Gait training, Patient/Family education, Joint mobilization, Dry Needling, Electrical stimulation, Cryotherapy, Moist heat, Vasopneumatic device, and Manual therapy   PLAN FOR NEXT SESSION: assess response to TPDN and repeat PRN. assess HEP response, progress RTC and periscapular strength as able      Ward Chatters, PT 05/07/2021, 9:46 AM

## 2021-05-14 ENCOUNTER — Other Ambulatory Visit: Payer: Self-pay

## 2021-05-14 ENCOUNTER — Ambulatory Visit: Payer: Medicaid Other

## 2021-05-14 DIAGNOSIS — G8929 Other chronic pain: Secondary | ICD-10-CM | POA: Diagnosis not present

## 2021-05-14 DIAGNOSIS — M25511 Pain in right shoulder: Secondary | ICD-10-CM | POA: Diagnosis not present

## 2021-05-14 DIAGNOSIS — M6281 Muscle weakness (generalized): Secondary | ICD-10-CM

## 2021-05-14 NOTE — Therapy (Addendum)
?OUTPATIENT PHYSICAL THERAPY TREATMENT NOTE/DISCHARGE ? ?PHYSICAL THERAPY DISCHARGE SUMMARY ? ?Visits from Start of Care: 7 ? ?Current functional level related to goals / functional outcomes: ?See goals and objective ?  ?Remaining deficits: ?See goals and objective ?  ?Education / Equipment: ?N/A  ? ?Patient agrees to discharge. Patient goals were  see goals . Patient is being discharged due to not returning since the last visit. ? ? ?Patient Name: Amanda Dickerson ?MRN: [id number] ?DOB:1960/11/02, 61 y.o., female ?Today's Date: 05/14/2021 ? ?PCP: Gladys Damme, MD ?REFERRING PROVIDER: Gladys Damme, MD ? ? PT End of Session - 05/14/21 0905   ? ? Visit Number 7   ? Number of Visits 15   ? Date for PT Re-Evaluation 06/12/21   ? Authorization Type [place] MCD - CCME   ? Authorization Time Period 04/11/2021-06/07/2021   ? Authorization - Visit Number 3   ? Authorization - Number of Visits 8   ? PT Start Time [phone]   ? PT Stop Time [phone]   ? PT Time Calculation (min) 38 min   ? Activity Tolerance Patient tolerated treatment well   ? Behavior During Therapy [facility] for tasks assessed/performed   ? ?  ?  ? ?  ? ? ? ? ? ? ?Past Medical History:  ?Diagnosis Date  ? Anemia   ? Anxiety   ? Arthritis   ? KNEES  ? Asthma   ? Colitis   ? Dysfunctional uterine bleeding   ? Fibromyalgia   ? GERD (gastroesophageal reflux disease)   ? Headache(784.0)   ? History of cardiovascular stress test   ? ETT-Myoview 7/16:  EF 71%, no ischemia or infarct; Low Risk  ? ?Past Surgical History:  ?Procedure Laterality Date  ? [place]  ? COLONOSCOPY    ? ENDOMETRIAL ABLATION W/ NOVASURE  08/2011  ? MULTIPLE TOOTH EXTRACTIONS    ? TUBAL LIGATION  2000  ? ?Patient Active Problem List  ? Diagnosis Date Noted  ? Rotator cuff syndrome of right shoulder 03/06/2021  ? 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 prior episode ([place]) 04/13/2020  ? GAD (generalized anxiety disorder) 04/13/2020  ? Grief  04/13/2020  ? Dyspareunia, female 07/19/2019  ? Irregular heart beat 05/30/2019  ? 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 Episodic recurrent vertigo 07/26/2015  ? Hyperlipidemia 07/26/2015  ? Pre-diabetes 07/26/2015  ? Obesity 07/26/2015  ? Gastroesophageal reflux disease 11/29/2013  ? Asthma, mild intermittent 10/25/2012  ? Fibromyalgia 07/18/2011  ?PCP: Gladys Damme, MD ?  ?REFERRING PROVIDER: Zenia Resides, MD ?  ?REFERRING DIAG:  ?M75.101 (ICD-10-CM) - Rotator cuff syndrome of right shoulder ? ? ?THERAPY DIAG:  ?Chronic right shoulder pain ? ?Muscle weakness (generalized) ? ?PERTINENT HISTORY: ?Chronic R shoulder pain for last year; also refers into neck and down R UE ? ?PRECAUTIONS: None ?  ?WEIGHT BEARING RESTRICTIONS No ? ?ONSET DATE: Chronic ? ?SUBJECTIVE:  ?Pt presents to PT with reports of increased pain and R shoulder and R sided neck pain. Felt pretty good after last PT session. Pt is ready to begin PT at this time. ? ?Pain: ?Are you having pain? Yes ?NPRS scale: 3/10 (8/10 at worst) ?Pain location: R shoulder, R UE, R side of neck ?PAIN TYPE: aching ?Pain description: intermittent  ?Aggravating factors: laying on R side, R UE rotation ?Relieving factors: unsure ? ? ? ? ?OBJECTIVE: taken 03/20/21 unless otherwise noted. ? ?PATIENT SURVEYS:  ?Quick Dash 52% disability - 04/08/2021 ?  ?UPPER  EXTREMITY AROM/PROM: ?  ?AROM Right ?03/20/2021 Left ?03/20/2021 Right ?04/02/21 Right ?04/08/2021  ?Shoulder flexion 115 135 130 130  ?Shoulder extension        ?Shoulder abduction 98* 121 110 92 p!  ?Shoulder internal rotation 15* 20 Reach to sacrum Reach to sacrum  ?Shoulder external rotation 90 90 Reach t2 Reach t2  ?(Blank rows = not tested, score listed is out of 5 possible points.  N = WNL, D = diminished, * = concordant pain with testing)  ?UPPER EXTREMITY MMT: ?  ?MMT Right ?03/20/2021 Right ?04/08/2021  ?Shoulder flexion 3/5 3/5  ?Shoulder abduction (C5) 3/5 3/5  ?Shoulder ER 3/5 3/5  ?Shoulder IR 2+/5* 3/5  ?Grip  strength 20# 20#  ?  ?(Blank rows = not tested, score listed is out of 5 possible points.  N = WNL, D = diminished, C = clear for gross weakness with myotome testing, * = concordant pain with testing)  ?  ? ?TODAY'S TREATMENT:  ?[facility] Adult PT Treatment:                                                DATE: 05/14/2021 ?Therapeutic Exercise: ?UBE lvl 1.0 x 4 min (54fd/2bwd) while taking subjective ?Seated row 3x10 black TB ?Seated horizontal abd 2x15 GTB ?Seated serratus hug 3x10 YTB ?Supine D2 flexion YTB R 2x10 each ?Supine serratus raise 2x10 YTB ?Supine bilat ER YTB 2x10 ?Manual Therapy: ?Trigger point release to R upper trap ?Positional release to R upper trap ?Suboccipital release ? [facility]                                                [facility]: [facility] ?Therapeutic Exercise: ?UBE lvl 1.0 x 4 min (270f/2bwd) while taking subjective ?Seated row 2x10 black TB ?Shoulder ext with scap retraction 2x10 black TB ?Seated horizontal abd 2x15 GTB ?Seated serratus hug 2x10 YTB ?Supine D2 flexion YTB R 2x10 ?Supine bilateral ER YTB x 10 ?Supine physioball raise 2x10 AAROM ?Seated bilateral ER 2x10 YTB ?Serratus raise with foam roll x 10 YTB ?Pulleys flexion and scaption x 1 minute each ?Manual Therapy: ?Trigger point release to R upper trap ?Positional release to R upper trap ? ?[facility]                                                DATE: 04/30/2021 ?Therapeutic Exercise: ?UBE lvl 1.0 x 4 min (31f27f2bwd) while taking subjective ?Seated row 2x10 15# ?Shoulder ext with scap retraction 2x10 black TB ?Supine horizontal abd 3x10 GTB ?Supine D2 flexion YTB R 2x10 ?Supine physioball raise 2x10 AAROM ?Seated bilateral ER 2x10 YTB ?Serratus raise with foam roll x 10 YTB ?Pulleys flexion and scaption x 1 minute each ?Manual Therapy: ?Trigger point release to R upper trap ?Positional release to R upper trap ?Manual cervical traction ? ?PATIENT EDUCATION: ?Education details: continue HEP ?Person educated:  Patient ?Education method: Explanation, Demonstration, and Handouts ?Education comprehension: verbalized understanding and returned demonstration ?  ?  ?HOME EXERCISE PROGRAM: ?Access Code: [email or web address]: [email or web address] ?Date: 04/02/2021 ?Prepared by: JesHessie Diener?  Exercises ?Seated Upper Trapezius Stretch - 2-3 x daily - 7 x weekly - 2 reps - 20 sec hold ?Seated Levator Scapulae Stretch - 2-3 x daily - 7 x weekly - 2 reps - 20 sec hold ?Standing Shoulder Row with Anchored Resistance - 2-3 x daily - 7 x weekly - 3 sets - 10 reps ?Standing Isometric Shoulder External Rotation with Doorway and Towel Roll - 2-3 x daily - 7 x weekly - 2 sets - 10 reps - 5 sec hold ?Standing Isometric Shoulder Internal Rotation with Towel Roll at Doorway - 2-3 x daily - 7 x weekly - 2 sets - 10 reps - 5 sec hold ?Supine Shoulder Press with Dowel - 1 x daily - 7 x weekly - 3 sets - 10 reps ?Supine shoulder Flexion Extension AAROM with Dowel to comfortable height overhead - 1 x daily - 7 x weekly - 2 sets - 10 reps ?Supine Shoulder Horizontal Abduction Adduction AAROM with Dowel - 1 x daily - 7 x weekly - 2 sets - 10 reps ?Supine Shoulder External Rotation with Dowel - 1 x daily - 7 x weekly - 2 sets - 10 reps ?Shoulder External Rotation with Anchored Resistance - 1 x daily - 7 x weekly - 2 sets - 10 reps ?Shoulder Internal Rotation with Resistance - 1 x daily - 7 x weekly - 2 sets - 10 reps ? ?ASSESSMENT: ?Pt was able to complete all prescribed exercises with no adverse effect. Continued to respond well to manual therapy interventions. Pt continues to benefit from skilled PT services and will continue to be seen and progressed as able.   ?  ?GOALS: ?Goals reviewed with patient? Yes ?  ?SHORT TERM GOALS: ?  ?STG Name Target Date Goal status  ?1 Pt will be compliant and knowledgeable with initial HEP for improved comfort and carryover ?Baseline: initial HEP given 04/10/2021 MET  ?2 Pt will self report R UE pain no  greater than 6/10 for improved comfort and functional ability ?Baseline: 8/10 at worst 04/10/2021 PARTIALLY MET  ?  ?LONG TERM GOALS:  ?  ?LTG Name Target Date Goal status  ?1 Pt will decrease Quick DASH disability s

## 2021-05-21 ENCOUNTER — Ambulatory Visit: Payer: Medicaid Other

## 2021-05-28 ENCOUNTER — Ambulatory Visit: Payer: Medicaid Other

## 2021-05-31 ENCOUNTER — Other Ambulatory Visit: Payer: Self-pay | Admitting: Family Medicine

## 2021-05-31 DIAGNOSIS — J4520 Mild intermittent asthma, uncomplicated: Secondary | ICD-10-CM

## 2021-07-26 ENCOUNTER — Other Ambulatory Visit: Payer: Self-pay | Admitting: Family Medicine

## 2021-07-26 DIAGNOSIS — R232 Flushing: Secondary | ICD-10-CM

## 2021-08-01 ENCOUNTER — Telehealth: Payer: Self-pay

## 2021-08-06 ENCOUNTER — Ambulatory Visit: Payer: Medicaid Other | Admitting: Family Medicine

## 2021-08-06 ENCOUNTER — Other Ambulatory Visit: Payer: Self-pay | Admitting: Family Medicine

## 2021-08-06 ENCOUNTER — Encounter: Payer: Self-pay | Admitting: Family Medicine

## 2021-08-06 VITALS — BP 112/78 | HR 64 | Ht 65.0 in | Wt 197.0 lb

## 2021-08-06 DIAGNOSIS — E119 Type 2 diabetes mellitus without complications: Secondary | ICD-10-CM | POA: Diagnosis not present

## 2021-08-06 DIAGNOSIS — R7303 Prediabetes: Secondary | ICD-10-CM | POA: Insufficient documentation

## 2021-08-06 DIAGNOSIS — N941 Unspecified dyspareunia: Secondary | ICD-10-CM

## 2021-08-06 DIAGNOSIS — Z0000 Encounter for general adult medical examination without abnormal findings: Secondary | ICD-10-CM

## 2021-08-06 DIAGNOSIS — Z1231 Encounter for screening mammogram for malignant neoplasm of breast: Secondary | ICD-10-CM

## 2021-08-06 DIAGNOSIS — R739 Hyperglycemia, unspecified: Secondary | ICD-10-CM

## 2021-08-06 LAB — POCT GLYCOSYLATED HEMOGLOBIN (HGB A1C): HbA1c, POC (controlled diabetic range): 7 % (ref 0.0–7.0)

## 2021-08-06 MED ORDER — METFORMIN HCL ER 500 MG PO TB24: 500.0000 mg | ORAL_TABLET | Freq: Every day | ORAL | 3 refills | Status: DC

## 2021-08-06 MED ORDER — ESTROGENS CONJUGATED 0.625 MG/GM VA CREA: 0.2500 | TOPICAL_CREAM | VAGINAL | 0 refills | Status: DC

## 2021-08-06 NOTE — Patient Instructions (Addendum)
It was wonderful to see you today.  Please bring ALL of your medications with you to every visit.   Today we talked about:  -Starting metformin for diabetes.  Follow-up in 1 month - Please go get your mammogram - I have refilled your Premarin cream to be used 1/4 applicator weekly.   Please be sure to schedule follow up at the front  desk before you leave today.   If you haven't already, sign up for My Chart to have easy access to your labs results, and communication with your primary care physician.  Please call the clinic at [phone] if your symptoms worsen or you have any concerns. It was our pleasure to serve you.  Dr. Janus Molder

## 2021-08-06 NOTE — Progress Notes (Signed)
    SUBJECTIVE:   CHIEF COMPLAINT / HPI:   Hx of pre-diabetes Strong family hx of diabetes.  Current Regimen: n/a Last A1c: 6.1 on 10/20/2018  Endorses eating chips in excess. Statin: n/a ACE/ARB: n/a  Medication refill, dyspareunia  Desire refill of premarin cream. She has not used it in a year and not currently in a relationship but desires the cream when she becomes sexually active again. Denies skin irritation when using in the past. No hx of VTE per patient report. Was using it weekly prior.   HM Mammogram needed-plans to make an appt.  Tdap-declined Shingrix- declined COVID #4- declined  PERTINENT  PMH / PSH: Colitis, HLD, HTN?  OBJECTIVE:   BP 112/78   Pulse 64   Ht 5' 5"  (1.651 m)   Wt 197 lb (89.4 kg)   SpO2 98%   BMI 32.78 kg/m   General: Appears well, no acute distress. Age appropriate. Cardiac: RRR, normal heart sounds, no murmurs Respiratory: CTAB, normal effort Extremities: No LE edema or cyanosis. Skin: Warm and dry, no rashes noted Neuro: alert and oriented Psych: normal affect  ASSESSMENT/PLAN:   Diabetes mellitus without complication (HCC) H4T 7.0. New diagnosis, previously prediabetic. Will start medication and referral to nutrition below. Consider GLP-1; the patient also desires weight loss. Follow up with PCP in 1 month. Complete physical at that time. Repeat A1c in 3 months. - metFORMIN (GLUCOPHAGE-XR) 500 MG 24 hr tablet; Take 1 tablet (500 mg total) by mouth daily with breakfast.  Dispense: 180 tablet; Refill: 3 - Referral to Nutrition and Diabetes Services  Dyspareunia, female Refilled premarin cream.   Healthcare maintenance -Resource to schedule mammogram given  Gerlene Fee, [place]

## 2021-08-15 NOTE — Telephone Encounter (Signed)
Sent info to pcp

## 2021-08-26 ENCOUNTER — Ambulatory Visit
Admission: RE | Admit: 2021-08-26 | Discharge: 2021-08-26 | Disposition: A | Discharge status: Home / Self Care | Payer: Medicaid Other | Source: Ambulatory Visit | Attending: Sports Medicine | Admitting: Sports Medicine

## 2021-08-26 DIAGNOSIS — Z1231 Encounter for screening mammogram for malignant neoplasm of breast: Secondary | ICD-10-CM

## 2021-08-27 ENCOUNTER — Encounter: Payer: Self-pay | Admitting: Family Medicine

## 2021-08-27 ENCOUNTER — Ambulatory Visit: Payer: Medicaid Other | Admitting: Family Medicine

## 2021-08-27 VITALS — BP 115/65 | HR 64 | Ht 65.0 in | Wt 194.0 lb

## 2021-08-27 DIAGNOSIS — E119 Type 2 diabetes mellitus without complications: Secondary | ICD-10-CM | POA: Diagnosis not present

## 2021-08-27 MED ORDER — METFORMIN HCL ER 500 MG PO TB24: 500.0000 mg | ORAL_TABLET | Freq: Two times a day (BID) | ORAL | 0 refills | Status: DC

## 2021-08-27 NOTE — Progress Notes (Signed)
    SUBJECTIVE:   CHIEF COMPLAINT / HPI:   Diabetes Current Regimen: Metformin 500 mg daily;tolerating well. Denies diarrhea or stomach upset.  Last A1c: 7.0 on 08/06/21  Denies polyuria, polydipsia, hypoglycemia Last Eye Exam: Needs to schedule Statin: Not taking. See phone note. Discussed.  ACE/ARB: n/a consider if additional HTN meds needed  PERTINENT  PMH / PSH: Hx of colitis, suspected chron's   OBJECTIVE:   BP 115/65   Pulse 64   Ht 5' 5"  (1.651 m)   Wt 194 lb (88 kg)   SpO2 100%   BMI 32.28 kg/m   General: Appears well, no acute distress. Age appropriate. Cardiac: RRR, normal heart sounds, no murmurs Respiratory: CTAB, normal effort Extremities: No edema or cyanosis. Neuro: alert and oriented Psych: normal affect  ASSESSMENT/PLAN:   Diabetes mellitus without complication (HCC) Increase metformin. - metFORMIN (GLUCOPHAGE-XR) 500 MG 24 hr tablet; Take 1 tablet (500 mg total) by mouth in the morning and at bedtime.  Dispense: 180 tablet; Refill: 0 - F/u in 2 months for repeat A1c Needs at follow up: -Strong recommendation for statin medication; patient phoned after visit and was discussed (ASCD risk 24%; see phone note) -Suggest diabetic eye exam -Desires weight loss; possible GLP-1     Amanda Dickerson, Amanda Dickerson

## 2021-08-28 ENCOUNTER — Telehealth: Payer: Self-pay | Admitting: Family Medicine

## 2021-08-28 NOTE — Telephone Encounter (Signed)
Called to discuss statin therapy. She states the cardiologist told her last year she does not need to be on a statin because her cardiac calcium score is 0. Discussed her diabetes status could increase the risk of arthrosclerosis in cardiac and other arteries overtime. Her ASCVD risk in the next 10 years is 24%. I high suggest statin at follow up.   Amanda Nowling Autry-Lott, DO 08/28/2021, 9:41 AM PGY-3, [place]

## 2021-08-28 NOTE — Assessment & Plan Note (Signed)
Increase metformin. - metFORMIN (GLUCOPHAGE-XR) 500 MG 24 hr tablet; Take 1 tablet (500 mg total) by mouth in the morning and at bedtime.  Dispense: 180 tablet; Refill: 0 - F/u in 2 months for repeat A1c Needs at follow up: -Strong recommendation for statin medication; patient phoned after visit and was discussed (ASCD risk 24%; see phone note) -Suggest diabetic eye exam -Desires weight loss; possible GLP-1

## 2021-09-16 NOTE — Progress Notes (Unsigned)
Cardiology Clinic Note   Patient Name: Amanda Dickerson Date of Encounter: 09/17/2021  Primary Care Provider:  Donney Dice, DO Primary Cardiologist:  None  Patient Profile    Amanda Dickerson 61 year old female presents to the clinic today for follow-up evaluation hyperlipidemia.  Past Medical History    Past Medical History:  Diagnosis Date   Anemia    Anxiety    Arthritis    KNEES   Asthma    Colitis    Dysfunctional uterine bleeding    Fibromyalgia    GERD (gastroesophageal reflux disease)    Headache(784.0)    History of cardiovascular stress test    ETT-Myoview 7/16:  EF 71%, no ischemia or infarct; Low Risk   Past Surgical History:  Procedure Laterality Date   CESAREAN SECTION  1988   COLONOSCOPY     ENDOMETRIAL ABLATION W/ NOVASURE  08/2011   MULTIPLE TOOTH EXTRACTIONS     TUBAL LIGATION  2000    Allergies  Allergies  Allergen Reactions   Ibuprofen Other (See Comments)    Upsets stomach   Penicillins Nausea And Vomiting    History of Present Illness    Amanda Dickerson is a PMH of fibromyalgia, generalized anxiety disorder, diabetes, GERD, asthma, vertigo, and hyperlipidemia.  Her PMH also includes palpitations and irregular heartbeat.  She was initially seen by Dr. Gardiner Rhyme on 4/21 for evaluation.  She reported that her irregular heartbeats would happen daily and go on for hours.  She did not feel like her heart was racing but did feel like her beats were irregular.  She denied drinking coffee but did report that she would occasionally have caffeinated soft drinks.  She rarely drinks alcohol.  She reported a family history of atrial fibrillation and congestive heart failure which her mother had.  She had an exercise stress test 7/16 which showed no ischemia or infarct and an EF of 71%.  Her echocardiogram 3/21 showed normal ventricular function and no significant valvular disease.  Her lab work 3/21 showed normal potassium and normal TSH.  She wore a  cardiac event monitor for 3 days which showed occasional PACs.  Triggered episodes corresponded with PACs.  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  She was seen in follow-up on 6/22 and reported that her palpitations had decreased in frequency.  She describes them as a Dromadol type sensation.  It would last a few seconds and go away.  She reported occasional lightheadedness.  She also had some sharp type chest discomfort.  This would occur 2 times per month.  It would last for about 5 minutes.  There was no clear triggers and the episodes would occur at rest and at exercise.  She was limited in her walking due to fatigue.  She endorsed snoring.  She reported not having been tested for sleep apnea.  She denied shortness of breath, headaches, and syncope.  She was not noted to have lower extremity swelling orthopnea or PND.  She was seen in follow-up by Dr. Gardiner Rhyme 09/21/2020.  During that time she reported that she was doing okay.  She had no further episodes of chest discomfort.  However, she had not been exercising.  She did notice some brief episodes of palpitations that would last for about a second.  She denied syncope, shortness of breath, and lower extremity swelling.  She reported that  she had not been active in the last 30 days.  She presents to the clinic today for follow-up evaluation states she goes to [facility] primary care.  They contacted her and recommended statin therapy.  We reviewed her previous cholesterol and coronary CTA.  I would not recommend that she go on statin therapy at this time.  She has been working on carb modified diet due to her increased A1c.  She has been increasing her physical activity and has been using a pedaling machine.  She reports occasional episodes of brief palpitations.  She also reports that her mother passed away and she has been sorting through  belongings.  I will have her increase her physical activity as tolerated with a recommendation of 150 minutes of moderate physical activity per week, call CBC CMP and lipid panel today.  We will plan follow-up for 12 months.  Today she denies chest pain, shortness of breath, lower extremity edema, fatigue, palpitations, melena, hematuria, hemoptysis, diaphoresis, weakness, presyncope, syncope, orthopnea, and PND.   Home Medications    Prior to Admission medications   Medication Sig Start Date End Date Taking? Authorizing Provider  albuterol (VENTOLIN HFA) 108 (90 Base) MCG/ACT inhaler INHALE 2 PUFFS EVERY 6 HOURS AS NEEDED FOR WHEEZE/SHORTNESS OF BREATH 03/14/20   Mullis, Kiersten P, DO  amLODipine (NORVASC) 5 MG tablet Take 1 tablet (5 mg total) by mouth daily. 10/04/20   Donato Heinz, MD  conjugated estrogens (PREMARIN) vaginal cream Place 5.72 Applicatorfuls vaginally once a week. 08/06/21   Autry-Lott, Naaman Plummer, DO  cyclobenzaprine (FLEXERIL) 5 MG tablet Take 0.5-1 tablets (2.5-5 mg total) by mouth at bedtime. 06/04/20   Mullis, Kiersten P, DO  DULERA 100-5 MCG/ACT AERO INHALE 2 PUFFS INTO THE LUNGS AS NEEDED FOR WHEEZING OR SHORTNESS OF BREATH. 06/01/21   Gladys Damme, MD  LIALDA 1.2 g EC tablet TAKE 2 TABLETS (2.4 G TOTAL) BY MOUTH 2 (TWO) TIMES DAILY. 12/28/20   Ladene Artist, MD  metFORMIN (GLUCOPHAGE-XR) 500 MG 24 hr tablet Take 1 tablet (500 mg total) by mouth in the morning and at bedtime. 08/27/21   Autry-Lott, Naaman Plummer, DO  omeprazole (PRILOSEC) 40 MG capsule Take 1 capsule (40 mg total) by mouth daily. 08/08/20   Levin Erp, PA  venlafaxine (EFFEXOR) 75 MG tablet TAKE 1 TABLET BY MOUTH TWICE A DAY 07/26/21   Gladys Damme, MD    Family History    Family History  Problem Relation Age of Onset   Hypertension Mother    Heart disease Mother    Hyperlipidemia Mother    Other Mother        vertigo   Colon polyps Mother    Diabetes Father    Prostate cancer  Father    Colon polyps Sister    Esophageal cancer Neg Hx    Colon cancer Neg Hx    Stomach cancer Neg Hx    Rectal cancer Neg Hx    She indicated that her mother is alive. She indicated that her father is deceased. She indicated that her sister is alive. She indicated that the status of her neg hx is unknown.  Social History    Social History   Socioeconomic History   Marital status: Divorced    Spouse name: Not on file   Number of children: 1   Years of education: 12   Highest education level: Not on file  Occupational History   Occupation: Disabled  Tobacco Use   Smoking status: Never  Smokeless tobacco: Never  Vaping Use   Vaping Use: Never used  Substance and Sexual Activity   Alcohol use: Yes    Comment: occasionally   Drug use: No   Sexual activity: Yes    Birth control/protection: Surgical  Other Topics Concern   Not on file  Social History Narrative   Lives alone   Caffeine - rarely   Social Determinants of Health   Financial Resource Strain: Not on file  Food Insecurity: Not on file  Transportation Needs: Not on file  Physical Activity: Not on file  Stress: Not on file  Social Connections: Not on file  Intimate Partner Violence: Not on file     Review of Systems    General:  No chills, fever, night sweats or weight changes.  Cardiovascular:  No chest pain, dyspnea on exertion, edema, orthopnea, palpitations, paroxysmal nocturnal dyspnea. Dermatological: No rash, lesions/masses Respiratory: No cough, dyspnea Urologic: No hematuria, dysuria Abdominal:   No nausea, vomiting, diarrhea, bright red blood per rectum, melena, or hematemesis Neurologic:  No visual changes, wkns, changes in mental status. All other systems reviewed and are otherwise negative except as noted above.  Physical Exam    VS:  BP 108/68 (BP Location: Left Arm, Patient Position: Sitting, Cuff Size: Large)   Pulse 60   Ht 5' 5"  (1.651 m)   Wt 189 lb 3.2 oz (85.8 kg)   SpO2  98%   BMI 31.48 kg/m  , BMI Body mass index is 31.48 kg/m. GEN: Well nourished, well developed, in no acute distress. HEENT: normal. Neck: Supple, no JVD, carotid bruits, or masses. Cardiac: RRR, no murmurs, rubs, or gallops. No clubbing, cyanosis, ankle edema.  Radials/DP/PT 2+ and equal bilaterally.  Respiratory:  Respirations regular and unlabored, clear to auscultation bilaterally. GI: Soft, nontender, nondistended, BS + x 4. MS: no deformity or atrophy. Skin: warm and dry, no rash. Neuro:  Strength and sensation are intact. Psych: Normal affect.  Accessory Clinical Findings    Recent Labs: No results found for requested labs within last 365 days.   Recent Lipid Panel    Component Value Date/Time   CHOL 289 (H) 08/08/2020 1231   TRIG 232 (H) 08/08/2020 1231   HDL 70 08/08/2020 1231   CHOLHDL 4.1 08/08/2020 1231   CHOLHDL 3.4 07/26/2015 0918   VLDL 28 07/26/2015 0918   LDLCALC 176 (H) 08/08/2020 1231    ECG personally reviewed by me today-none today.  Echocardiogram 05/31/2019 IMPRESSIONS     1. Left ventricular ejection fraction, by estimation, is 65 to 70%. The  left ventricle has normal function. The left ventricle has no regional  wall motion abnormalities. Left ventricular diastolic parameters were  normal.   2. Right ventricular systolic function is normal. The right ventricular  size is normal. There is mildly elevated pulmonary artery systolic  pressure.   3. The mitral valve is normal in structure. Trivial mitral valve  regurgitation. No evidence of mitral stenosis.   4. The aortic valve is tricuspid. Aortic valve regurgitation is not  visualized. No aortic stenosis is present.   Conclusion(s)/Recommendation(s): Normal biventricular function without  evidence of hemodynamically significant valvular heart disease.   Cardiac event monitor 07/04/2019 Occasional PACs (2.3% of beats). Patient triggered events appear to correspond to PACs   3 days of data  recorded on Zio monitor. Patient had a min HR of 46 bpm, max HR of 154 bpm, and avg HR of 67 bpm. Predominant underlying rhythm was Sinus Rhythm. No VT,  atrial fibrillation, high degree block, or pauses noted. Three runs of SVT, longest lasting 11 beats.  Isolated ventricular ectopy was rare (<1%).  Isolated atrail ectopy was occasional (2.3%). There were 24 triggered events, corresponding to sinus rhythm +/- PACs.   No significant arrhythmias detected.  Coronary CTA 08/15/2020  IMPRESSION: 1. Coronary calcium score of 0. This was 0 percentile for age and sex matched control.   2. 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   3.  No evidence of CAD.  CAD RADS 0.   4. Recommend nuclear stress testing to assess for ischemia in the RCA territory.   5.  This study has been sent for FFR flow analysis.   Traci Turner  Assessment & Plan   1.  Palpitations-continues to have brief intermittent episodes of irregular heartbeat.  Previously wore a cardiac event monitor x3 days which occasional PACs which did correspond with triggered events. Heart healthy low-sodium diet-salty 6 given Increase physical activity as tolerated Avoid triggers caffeine, chocolate, EtOH, dehydration etc.  Atypical chest pain-no recent episodes of arm neck back or chest discomfort.  Underwent coronary CTA 6/22 which showed anomalous RCA, coronary calcium score of 0 and no significant coronary lesions with FFR. Heart healthy low-sodium diet-salty 6 given Increase physical activity as tolerated Continue to monitor No plans for ischemic evaluation  Hyperlipidemia-LDL 176 on 08/08/20 Heart healthy low-sodium high-fiber diet.   Increase physical activity as tolerated  Type 2 diabetes-glucose 144 on 08/21/2020 Continue metformin Heart healthy low-sodium carb  modified diet Increase physical activity as tolerated Follows with PCP  Disposition: Follow-up with Dr. Gardiner Rhyme or me in 12 months.   Jossie Ng. Mela Perham NP-C     09/17/2021, [place] [address] Office [phone] Fax [phone]  Notice: This dictation was prepared with Dragon dictation along with smaller phrase technology. Any transcriptional errors that result from this process are unintentional and may not be corrected upon review.  I spent 13 minutes examining this patient, reviewing medications, and using patient centered shared decision making involving her cardiac care.  Prior to her visit I spent greater than 20 minutes reviewing her past medical history,  medications, and prior cardiac tests.

## 2021-09-17 ENCOUNTER — Encounter: Payer: Self-pay | Admitting: General Practice

## 2021-09-17 ENCOUNTER — Ambulatory Visit: Payer: Medicaid Other | Admitting: General Practice

## 2021-09-17 VITALS — BP 108/68 | HR 60 | Ht 65.0 in | Wt 189.2 lb

## 2021-09-17 DIAGNOSIS — E0800 Diabetes mellitus due to underlying condition with hyperosmolarity without nonketotic hyperglycemic-hyperosmolar coma (NKHHC): Secondary | ICD-10-CM | POA: Diagnosis not present

## 2021-09-17 DIAGNOSIS — R079 Chest pain, unspecified: Secondary | ICD-10-CM | POA: Diagnosis not present

## 2021-09-17 DIAGNOSIS — R002 Palpitations: Secondary | ICD-10-CM

## 2021-09-17 DIAGNOSIS — E785 Hyperlipidemia, unspecified: Secondary | ICD-10-CM | POA: Diagnosis not present

## 2021-09-17 NOTE — Patient Instructions (Signed)
Medication Instructions:  The current medical regimen is effective;  continue present plan and medications as directed. Please refer to the Current Medication list given to you today.   *If you need a refill on your cardiac medications before your next appointment, please call your pharmacy*  Lab Work:   Testing/Procedures:  LIPID,CBC,CMP TODAY NONE If you have labs (blood work) drawn today and your tests are completely normal, you will receive your results only by: [place] (if you have MyChart) OR  A paper copy in the mail If you have any lab test that is abnormal or we need to change your treatment, we will call you to review the results.  Special Instructions CONTINUE YOUR CURRENT DIET  PLEAS INCREASE PHYSICAL ACTIVITY-YOUR GOAL IS 150 MINUTES OF MODERATE PHYSICAL ACTIVITY INCREASE SLOWLY-AT YOUR OWN PACE  Follow-Up: Your next appointment:  12 month(s) In Person with Donato Heinz, MD    Please call our office 2 months in advance to schedule this appointment   At [facility], you and your health needs are our priority.  As part of our continuing mission to provide you with exceptional heart care, we have created designated Provider Care Teams.  These Care Teams include your primary Cardiologist (physician) and Advanced Practice Providers (APPs -  Physician Assistants and Nurse Practitioners) who all work together to provide you with the care you need, when you need it.  We recommend signing up for the patient portal called "MyChart".  Sign up information is provided on this After Visit Summary.  MyChart is used to connect with patients for Virtual Visits (Telemedicine).  Patients are able to view lab/test results, encounter notes, upcoming appointments, etc.  Non-urgent messages can be sent to your provider as well.   To learn more about what you can do with MyChart, go to [email or web address].    Important Information About Sugar

## 2021-09-18 LAB — COMPREHENSIVE METABOLIC PANEL
ALT: 23 IU/L (ref 0–32)
AST: 24 IU/L (ref 0–40)
Albumin/Globulin Ratio: 1.6 (ref 1.2–2.2)
Albumin: 4.5 g/dL (ref 3.8–4.9)
Alkaline Phosphatase: 83 IU/L (ref 44–121)
BUN/Creatinine Ratio: 13 (ref 9–23)
BUN: 11 mg/dL (ref 6–24)
Bilirubin Total: 0.3 mg/dL (ref 0.0–1.2)
CO2: 20 mmol/L (ref 20–29)
Calcium: 9.3 mg/dL (ref 8.7–10.2)
Chloride: 104 mmol/L (ref 96–106)
Creatinine, Ser: 0.84 mg/dL (ref 0.57–1.00)
Globulin, Total: 2.8 g/dL (ref 1.5–4.5)
Glucose: 112 mg/dL — ABNORMAL HIGH (ref 70–99)
Potassium: 4.4 mmol/L (ref 3.5–5.2)
Sodium: 141 mmol/L (ref 134–144)
Total Protein: 7.3 g/dL (ref 6.0–8.5)
eGFR: 80 mL/min/{1.73_m2} (ref >59)

## 2021-09-18 LAB — LIPID PANEL
Chol/HDL Ratio: 3.7 ratio (ref 0.0–4.4)
Cholesterol, Total: 217 mg/dL — ABNORMAL HIGH (ref 100–199)
HDL: 58 mg/dL (ref >39)
LDL Chol Calc (NIH): 137 mg/dL — ABNORMAL HIGH (ref 0–99)
Triglycerides: 122 mg/dL (ref 0–149)
VLDL Cholesterol Cal: 22 mg/dL (ref 5–40)

## 2021-09-18 LAB — CBC
Hematocrit: 40.9 % (ref 34.0–46.6)
Hemoglobin: 13.6 g/dL (ref 11.1–15.9)
MCH: 27.5 pg (ref 26.6–33.0)
MCHC: 33.3 g/dL (ref 31.5–35.7)
MCV: 83 fL (ref 79–97)
Platelets: 257 10*3/uL (ref 150–450)
RBC: 4.95 x10E6/uL (ref 3.77–5.28)
RDW: 12.8 % (ref 11.7–15.4)
WBC: 3.5 10*3/uL (ref 3.4–10.8)

## 2021-09-23 ENCOUNTER — Other Ambulatory Visit: Payer: Self-pay | Admitting: Family Medicine

## 2021-09-23 DIAGNOSIS — J4520 Mild intermittent asthma, uncomplicated: Secondary | ICD-10-CM

## 2021-09-26 ENCOUNTER — Other Ambulatory Visit: Payer: Self-pay | Admitting: Cardiology

## 2021-09-26 NOTE — Telephone Encounter (Signed)
*  STAT* If patient is at the pharmacy, call can be transferred to refill team.   1. Which medications need to be refilled? (please list name of each medication and dose if known)   amLODipine (NORVASC) 5 MG tablet  2. Which pharmacy/location (including street and city if local pharmacy) is medication to be sent to?  [id number]- [place],  - [address]. Do they need a 30 day or 90 day supply?   90 days  Patient stated she only has 3 tablets left.

## 2021-10-07 ENCOUNTER — Encounter: Payer: Self-pay | Admitting: Family Medicine

## 2021-10-07 ENCOUNTER — Ambulatory Visit: Payer: Medicaid Other | Admitting: Family Medicine

## 2021-10-07 VITALS — BP 115/75 | HR 65 | Ht 65.0 in | Wt 185.1 lb

## 2021-10-07 DIAGNOSIS — E119 Type 2 diabetes mellitus without complications: Secondary | ICD-10-CM | POA: Diagnosis not present

## 2021-10-07 DIAGNOSIS — E782 Mixed hyperlipidemia: Secondary | ICD-10-CM | POA: Diagnosis not present

## 2021-10-07 LAB — POCT GLYCOSYLATED HEMOGLOBIN (HGB A1C): HbA1c, POC (controlled diabetic range): 6 % (ref 0.0–7.0)

## 2021-10-07 MED ORDER — ATORVASTATIN CALCIUM 10 MG PO TABS: 10.0000 mg | ORAL_TABLET | Freq: Every day | ORAL | 3 refills | Status: DC

## 2021-10-07 NOTE — Progress Notes (Signed)
    SUBJECTIVE:   CHIEF COMPLAINT / HPI:   Patient presents for diabetes follow up, previously placed on metformin 500 mg and then increased twice daily without checking her glucose levels. So the patient decided that she was only going to stay with the 500 mg. She has been exercising regularly and eating more healthy, she has been eating a lot of chicken with salads. Does not drink sodas, drinks water. Previously was eat a lot of potato chips which she is not anymore. She was initially concerned about initiating the metformin due to her history of colitis. Denies dizziness. Has been 2 years since ophthalmologist.   OBJECTIVE:   BP 115/75   Pulse 65   Ht 5' 5"  (1.651 m)   Wt 185 lb 2 oz (84 kg)   SpO2 99%   BMI 30.81 kg/m   General: Patient well-appearing, in no acute distress. CV: RRR, no murmurs or gallops auscultated Resp: CTAB, no wheezing, rales or rhonchi noted Abdomen: soft, nontender, nondistended, presence of bowel sounds Ext: normal foot exam with normal microfilament testing, no wounds or ulcers noted, distal pulses strong and equal bilaterally, 2+ achilles reflex bilaterally, no LE edema noted bilaterally, gross sensation intact Psych: mood appropriate   ASSESSMENT/PLAN:   Diabetes mellitus without complication (HCC) -Y4I 6.0 today, at goal -after extensive discussion and shared-decision making, discontinued metformin 500 mg  -diet and exercise counseling provided, congratulated patient on the progress she has made with lifestyle modifications  -ophthalmology referral placed, discussed importance of annual eye exams with DM diagnosis -normal foot exam -follow up in 3 months to recheck A1c, may have to reinitiate metformin if appropriate   Hyperlipidemia -most recent cholesterol 217 and LDL 137 -diet and exercise counseling -started atorvastatin 10 mg daily, discussed importance of statin initiation     [place], [place]

## 2021-10-07 NOTE — Assessment & Plan Note (Signed)
-  A1c 6.0 today, at goal -after extensive discussion and shared-decision making, discontinued metformin 500 mg  -diet and exercise counseling provided, congratulated patient on the progress she has made with lifestyle modifications  -ophthalmology referral placed, discussed importance of annual eye exams with DM diagnosis -normal foot exam -follow up in 3 months to recheck A1c, may have to reinitiate metformin if appropriate

## 2021-10-07 NOTE — Patient Instructions (Addendum)
It was great seeing you today!  Today we discussed your diabetes. Your A1c is 6.0. You can stop the metformin. We will start atorvastatin 10 mg daily. Please continue to eat healthy and exercise at least 90 minutes a week.   Please follow up at your next scheduled appointment, in 3 months if anything arises between now and then, please don't hesitate to contact our office.   Thank you for allowing [place] to be a part of your medical care!  Thank you, Dr. Larae Grooms

## 2021-10-07 NOTE — Assessment & Plan Note (Signed)
-  most recent cholesterol 217 and LDL 137 -diet and exercise counseling -started atorvastatin 10 mg daily, discussed importance of statin initiation

## 2021-10-10 ENCOUNTER — Telehealth: Payer: Self-pay | Admitting: Cardiology

## 2021-10-10 NOTE — Telephone Encounter (Signed)
Pt c/o medication issue:  1. Name of Medication:   atorvastatin (LIPITOR) 10 MG tablet  2. How are you currently taking this medication (dosage and times per day)? Not taking  3. Are you having a reaction (difficulty breathing--STAT)? N/A  4. What is your medication issue?   Patient called stating she was recently prescribed this medication but wants advice on whether she needs to take this statin.

## 2021-10-10 NOTE — Telephone Encounter (Signed)
Spoke to patient she stated her PCP would like her to take a statin.Stated Dr.Schumann told her last year she did not need to take a statin.She would like his advice.Message sent to [place].

## 2021-10-11 NOTE — Telephone Encounter (Signed)
Her cholesterol looks significantly improved, LDL is down from 170s to 130s 08/2021.  Was that off statin?  Given her calcium score 0, if her cholesterol was significantly improved off statin we can hold off at this time.

## 2021-10-15 NOTE — Telephone Encounter (Signed)
Spoke to patient, patient reports recent labs were off of any cholesterol medication.   She is aware of recommendations and prefers to hold off on starting statin at this time.

## 2021-10-31 ENCOUNTER — Other Ambulatory Visit: Payer: Self-pay

## 2021-10-31 DIAGNOSIS — R232 Flushing: Secondary | ICD-10-CM

## 2021-10-31 MED ORDER — VENLAFAXINE HCL 75 MG PO TABS: 75.0000 mg | ORAL_TABLET | Freq: Two times a day (BID) | ORAL | 2 refills | Status: DC

## 2021-11-23 ENCOUNTER — Other Ambulatory Visit: Payer: Self-pay | Admitting: Family Medicine

## 2021-11-23 ENCOUNTER — Other Ambulatory Visit: Payer: Self-pay | Admitting: Gastroenterology

## 2021-11-23 DIAGNOSIS — R232 Flushing: Secondary | ICD-10-CM

## 2021-12-03 DIAGNOSIS — H938X1 Other specified disorders of right ear: Secondary | ICD-10-CM | POA: Diagnosis not present

## 2021-12-03 DIAGNOSIS — H60332 Swimmer's ear, left ear: Secondary | ICD-10-CM | POA: Diagnosis not present

## 2021-12-10 DIAGNOSIS — H60332 Swimmer's ear, left ear: Secondary | ICD-10-CM | POA: Diagnosis not present

## 2021-12-25 ENCOUNTER — Other Ambulatory Visit: Payer: Self-pay | Admitting: Gastroenterology

## 2022-01-07 ENCOUNTER — Encounter: Payer: Self-pay | Admitting: Gastroenterology

## 2022-01-07 ENCOUNTER — Ambulatory Visit: Payer: Medicaid Other | Admitting: Gastroenterology

## 2022-01-07 VITALS — BP 120/70 | HR 63 | Ht 65.0 in | Wt 179.2 lb

## 2022-01-07 DIAGNOSIS — K219 Gastro-esophageal reflux disease without esophagitis: Secondary | ICD-10-CM

## 2022-01-07 DIAGNOSIS — K5010 Crohn's disease of large intestine without complications: Secondary | ICD-10-CM | POA: Diagnosis not present

## 2022-01-07 MED ORDER — MESALAMINE 1.2 G PO TBEC: 2.4000 g | DELAYED_RELEASE_TABLET | Freq: Every day | ORAL | 3 refills | Status: DC

## 2022-01-07 MED ORDER — OMEPRAZOLE 40 MG PO CPDR: 40.0000 mg | DELAYED_RELEASE_CAPSULE | Freq: Every day | ORAL | 3 refills | Status: DC

## 2022-01-07 NOTE — Progress Notes (Signed)
Assessment     Mild Crohn's colitis, well controlled GERD, history of erosive gastritis and H pylori - treated DM   Recommendations    Decrease Lialda to 2.4g qd.  If symptoms recur we will resume 2.4 g twice daily Continue omeprazole 40 mg po qd and follow antireflux measures REV in 1 year   HPI    This is a 61 year old female with Crohn's colitis and GERD. Her symptoms are controlled on her current medications. CMP, CBC in July 2023 were normal.    Labs / Imaging       Latest Ref Rng & Units 09/17/2021    9:13 AM 08/21/2020   11:52 AM 08/15/2019    8:04 AM  Hepatic Function  Total Protein 6.0 - 8.5 g/dL 7.3  7.3  7.2   Albumin 3.8 - 4.9 g/dL 4.5  4.3  4.3   AST 0 - 40 IU/L 24  17  22   ALT 0 - 32 IU/L 23  17  32   Alk Phosphatase 44 - 121 IU/L 83  67  76   Total Bilirubin 0.0 - 1.2 mg/dL 0.3  0.4  0.4   Bilirubin, Direct 0.0 - 0.3 mg/dL   0.1        Latest Ref Rng & Units 09/17/2021    9:13 AM 08/21/2020   11:52 AM 05/27/2019   11:02 AM  CBC  WBC 3.4 - 10.8 x10E3/uL 3.5  4.8  4.1   Hemoglobin 11.1 - 15.9 g/dL 13.6  13.1  13.2   Hematocrit 34.0 - 46.6 % 40.9  38.7  40.5   Platelets 150 - 450 x10E3/uL 257  239.0  256     Current Medications, Allergies, Past Medical History, Past Surgical History, Family History and Social History were reviewed in [facility] electronic medical record.   Physical Exam: General: Well developed, well nourished, no acute distress Head: Normocephalic and atraumatic Eyes: Sclerae anicteric, EOMI Ears: Normal auditory acuity Mouth: Not examined Lungs: Clear throughout to auscultation Heart: Regular rate and rhythm; no murmurs, rubs or bruits Abdomen: Soft, non tender and non distended. No masses, hepatosplenomegaly or hernias noted. Normal Bowel sounds Rectal: Not done Musculoskeletal: Symmetrical with no gross deformities  Pulses:  Normal pulses noted Extremities: No clubbing, cyanosis, edema or deformities  noted Neurological: Alert oriented x 4, grossly nonfocal Psychological:  Alert and cooperative. Normal mood and affect    T. , MD 01/07/2022, 1:20 PM  

## 2022-01-07 NOTE — Patient Instructions (Signed)
We have sent the following medications to your pharmacy for you to pick up at your convenience: omeprazole and Lialda.   The [facility] providers would like to encourage you to use [facility] to communicate with providers for non-urgent requests or questions.  Due to long hold times on the telephone, sending your provider a message by [facility] may be a faster and more efficient way to get a response.  Please allow 48 business hours for a response.  Please remember that this is for non-urgent requests.   Thank you for choosing me and [facility].  Pricilla Riffle. Dagoberto Ligas., MD., Marval Regal

## 2022-02-19 ENCOUNTER — Other Ambulatory Visit: Payer: Self-pay | Admitting: Family Medicine

## 2022-02-19 DIAGNOSIS — J4520 Mild intermittent asthma, uncomplicated: Secondary | ICD-10-CM

## 2022-03-14 ENCOUNTER — Other Ambulatory Visit: Payer: Self-pay | Admitting: Cardiology

## 2022-03-24 ENCOUNTER — Other Ambulatory Visit: Payer: Self-pay

## 2022-03-24 ENCOUNTER — Encounter: Payer: Self-pay | Admitting: Family Medicine

## 2022-03-24 ENCOUNTER — Ambulatory Visit: Payer: Medicaid Other | Admitting: Family Medicine

## 2022-03-24 VITALS — BP 134/70 | HR 67 | Wt 184.6 lb

## 2022-03-24 DIAGNOSIS — R7303 Prediabetes: Secondary | ICD-10-CM | POA: Diagnosis not present

## 2022-03-24 DIAGNOSIS — R5383 Other fatigue: Secondary | ICD-10-CM

## 2022-03-24 DIAGNOSIS — E782 Mixed hyperlipidemia: Secondary | ICD-10-CM

## 2022-03-24 DIAGNOSIS — H8190 Unspecified disorder of vestibular function, unspecified ear: Secondary | ICD-10-CM

## 2022-03-24 LAB — POCT GLYCOSYLATED HEMOGLOBIN (HGB A1C): HbA1c, POC (controlled diabetic range): 6.1 % (ref 0.0–7.0)

## 2022-03-24 NOTE — Progress Notes (Signed)
  Date of Visit: 03/24/2022   SUBJECTIVE:   HPI:  Amanda Dickerson presents today for a same day appointment to discuss dizziness.  Dizziness: Reports she has had feeling of spinning intermittently for years, but on Saturday morning woke up with pretty severe.  Also had it for much of yesterday (Sunday).  She does not have any spinning right now.  No recent viral infections.  Prediabetes: Wanted A1c checked today.  It is 6.1.  She is not on any medications for diabetes.  On review of chart she only had 1 prior elevated A1c in the diabetes range.  Hyperlipidemia: Previously prescribed statin, however she did not take it as she reports she has been off issues with her muscles as is.  Is amenable to possibly trying it.  Has felt tired.  PMHx: Asthma, episodic recurrent vertigo, hyperlipidemia, obesity, fibromyalgia, GERD, Crohn's disease, GAD/depression, diabetes  OBJECTIVE:   BP 134/70   Pulse 67   Wt 184 lb 9.6 oz (83.7 kg)   SpO2 100%   BMI 30.72 kg/m  Gen: no acute distress, pleasant cooperative HEENT: normocephalic, atraumatic. Tympanic membranes clear bilaterally. Thyroid normal in size. No anterior cervical or supraclavicular lymphadenopathy.  Heart: regular rate and rhythm, no murmur Lungs: clear to auscultation bilaterally, normal work of breathing  Neuro: alert, speech normal.  Face symmetric.  Sensation intact over bilateral face and upper and lower extremities.  Full strength upper and lower extremities.  Tongue protrudes midline.  Uvula midline.  Extraocular movements intact.  Pupils equal round and reactive to light.  Finger-nose-finger testing normal bilaterally Ext: No appreciable lower extremity edema bilaterally   ASSESSMENT/PLAN:   Health maintenance:  -declines vaccines today - flu, COVID, shingrix, Tdap -removed diabetes from HM modifiers  Prediabetes Changing diagnosis to prediabetes.  She technically never met criteria for diabetes with only 1 elevated A1c in the  past.  Monitor A1c approximately every 6 months.  Continue lifestyle modification.  Hyperlipidemia Discussed role of statins in reducing risk for cardiovascular events.  Check direct LDL today.  Encouraged her to try statin and see if she can tolerate it.  Episodic recurrent vertigo Consistent with BPPV.  Given handout on this diagnosis.  Currently asymptomatic.  Consider referral to vestibular rehab if recurs in the future.   FOLLOW UP: Schedule follow-up with PCP  [place] J. Ardelia Mems, [place]

## 2022-03-24 NOTE — Patient Instructions (Addendum)
It was nice to meet you today!  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 See handout below.  Updating cholesterol number today, along with kidneys, electrolytes, thyroid, and blood counts  A1c looks great today  Schedule follow up with Dr. Larae Grooms  Be well, Dr. Ardelia Mems   Benign Positional Vertigo Vertigo is the feeling that you or your surroundings are moving when they are not. Benign positional vertigo is the most common form of vertigo. This is usually a harmless condition (benign). This condition is positional. This means that symptoms are triggered by certain movements and positions. This condition can be dangerous if it occurs while you are doing something that could cause harm to yourself or others. This includes activities such as driving or operating machinery. What are the causes? The inner ear has fluid-filled canals that help your brain sense movement and balance. When the fluid moves, the brain receives messages about your body's position. With benign positional vertigo, calcium crystals in the inner ear break free and disturb the inner ear area. This causes your brain to receive confusing messages about your body's position. What increases the risk? You are more likely to develop this condition if: You are a woman. You are 14 years of age or older. You have recently had a head injury. You have an inner ear disease. What are the signs or symptoms? Symptoms of this condition usually happen when you move your head or your eyes in different directions. Symptoms may start suddenly and usually last for less than a minute. They include: Loss of balance and falling. Feeling like you are spinning or moving. Feeling like your surroundings are spinning or moving. Nausea and vomiting. Blurred vision. Dizziness. Involuntary eye movement (nystagmus). Symptoms can be mild and cause only minor problems, or they can be severe and interfere with daily life. Episodes  of benign positional vertigo may return (recur) over time. Symptoms may also improve over time. How is this diagnosed? This condition may be diagnosed based on: Your medical history. A physical exam of the head, neck, and ears. Positional tests to check for or stimulate vertigo. You may be asked to turn your head and change positions, such as going from sitting to lying down. A health care provider will watch for symptoms of vertigo. You may be referred to a health care provider who specializes in ear, nose, and throat problems (ENT or otolaryngologist) or a provider who specializes in disorders of the nervous system (neurologist). How is this treated?  This condition may be treated in a session in which your health care provider moves your head in specific positions to help the displaced crystals in your inner ear move. Treatment for this condition may take several sessions. Surgery may be needed in severe cases, but this is rare. In some cases, benign positional vertigo may resolve on its own in 2-4 weeks. Follow these instructions at home: Safety Move slowly. Avoid sudden body or head movements or certain positions, as told by your health care provider. Avoid driving or operating machinery until your health care provider says it is safe. Avoid doing any tasks that would be dangerous to you or others if vertigo occurs. If you have trouble walking or keeping your balance, try using a cane for stability. If you feel dizzy or unstable, sit down right away. Return to your normal activities as told by your health care provider. Ask your health care provider what activities are safe for you. General instructions Take over-the-counter and  prescription medicines only as told by your health care provider. Drink enough fluid to keep your urine pale yellow. Keep all follow-up visits. This is important. Contact a health care provider if: You have a fever. Your condition gets worse or you develop new  symptoms. Your family or friends notice any behavioral changes. You have nausea or vomiting that gets worse. You have numbness or a prickling and tingling sensation. Get help right away if you: Have difficulty speaking or moving. Are always dizzy or faint. Develop severe headaches. Have weakness in your legs or arms. Have changes in your hearing or vision. Develop a stiff neck. Develop sensitivity to light. These symptoms may represent a serious problem that is an emergency. Do not wait to see if the symptoms will go away. Get medical help right away. Call your local emergency services (911 in the [place] Do not drive yourself to the hospital. Summary Vertigo is the feeling that you or your surroundings are moving when they are not. Benign positional vertigo is the most common form of vertigo. This condition is caused by calcium crystals in the inner ear that become displaced. This causes a disturbance in an area of the inner ear that helps your brain sense movement and balance. Symptoms include loss of balance and falling, feeling that you or your surroundings are moving, nausea and vomiting, and blurred vision. This condition can be diagnosed based on symptoms, a physical exam, and positional tests. Follow safety instructions as told by your health care provider and keep all follow-up visits. This is important. This information is not intended to replace advice given to you by your health care provider. Make sure you discuss any questions you have with your health care provider. Document Revised: 01/18/2020 Document Reviewed: 01/18/2020 Elsevier Patient Education  [place].

## 2022-03-25 LAB — BASIC METABOLIC PANEL
BUN/Creatinine Ratio: 17 (ref 9–23)
BUN: 13 mg/dL (ref 6–24)
CO2: 21 mmol/L (ref 20–29)
Calcium: 9.2 mg/dL (ref 8.7–10.2)
Chloride: 104 mmol/L (ref 96–106)
Creatinine, Ser: 0.76 mg/dL (ref 0.57–1.00)
Glucose: 112 mg/dL — ABNORMAL HIGH (ref 70–99)
Potassium: 4.5 mmol/L (ref 3.5–5.2)
Sodium: 141 mmol/L (ref 134–144)
eGFR: 90 mL/min/{1.73_m2} (ref >59)

## 2022-03-25 LAB — CBC
Hematocrit: 40.9 % (ref 34.0–46.6)
Hemoglobin: 13.4 g/dL (ref 11.1–15.9)
MCH: 27.3 pg (ref 26.6–33.0)
MCHC: 32.8 g/dL (ref 31.5–35.7)
MCV: 83 fL (ref 79–97)
Platelets: 245 10*3/uL (ref 150–450)
RBC: 4.91 x10E6/uL (ref 3.77–5.28)
RDW: 13.1 % (ref 11.7–15.4)
WBC: 4.8 10*3/uL (ref 3.4–10.8)

## 2022-03-25 LAB — TSH RFX ON ABNORMAL TO FREE T4: TSH: 1.2 u[IU]/mL (ref 0.450–4.500)

## 2022-03-25 LAB — LDL CHOLESTEROL, DIRECT: LDL Direct: 114 mg/dL — ABNORMAL HIGH (ref 0–99)

## 2022-03-25 NOTE — Assessment & Plan Note (Signed)
Discussed role of statins in reducing risk for cardiovascular events.  Check direct LDL today.  Encouraged her to try statin and see if she can tolerate it.

## 2022-03-25 NOTE — Assessment & Plan Note (Signed)
Changing diagnosis to prediabetes.  She technically never met criteria for diabetes with only 1 elevated A1c in the past.  Monitor A1c approximately every 6 months.  Continue lifestyle modification.

## 2022-03-25 NOTE — Assessment & Plan Note (Signed)
Consistent with BPPV.  Given handout on this diagnosis.  Currently asymptomatic.  Consider referral to vestibular rehab if recurs in the future.

## 2022-03-28 ENCOUNTER — Encounter: Payer: Self-pay | Admitting: Family Medicine

## 2022-04-01 ENCOUNTER — Telehealth: Payer: Self-pay

## 2022-04-01 NOTE — Telephone Encounter (Signed)
Patient calls nurse line regarding results. Verified name and DOB. Discussed results per note from Dr. Ardelia Mems. Patient does not desire to start cholesterol medication at this time.   She will follow up with PCP in six months.   Talbot Grumbling, RN

## 2022-04-10 ENCOUNTER — Other Ambulatory Visit: Payer: Self-pay | Admitting: Gastroenterology

## 2022-04-10 ENCOUNTER — Other Ambulatory Visit: Payer: Self-pay | Admitting: Family Medicine

## 2022-04-10 DIAGNOSIS — R232 Flushing: Secondary | ICD-10-CM

## 2022-05-09 ENCOUNTER — Ambulatory Visit (INDEPENDENT_AMBULATORY_CARE_PROVIDER_SITE_OTHER): Payer: 59 | Admitting: Student

## 2022-05-09 VITALS — BP 110/62 | HR 68 | Ht 65.0 in | Wt 190.0 lb

## 2022-05-09 DIAGNOSIS — H01119 Allergic dermatitis of unspecified eye, unspecified eyelid: Secondary | ICD-10-CM | POA: Diagnosis not present

## 2022-05-09 MED ORDER — CETIRIZINE HCL 10 MG PO TABS: 10.0000 mg | ORAL_TABLET | Freq: Every day | ORAL | 5 refills | Status: DC | PRN

## 2022-05-09 NOTE — Assessment & Plan Note (Addendum)
Patient complaining of eyelid irritation for 3 wks following appt w/ dentist office where she wore goggles for procedure. She noted burning and itching shortly after and saw redness/bumps 2 days later. She hasn't tried anything for this, but has been avoiding touching with her hands. Visual fields intact, slight area of erythema and swelling in medial corner of right eye. Unlikely to be HSV given lack of papules/ulceration, unlikely to be eye infection as eye is normal in appearance, no crusting and lesion located on eye lid. Most likely an eyelid dermatis given symptoms and hx. Will avoid steroid given location, but will try antihistamine to see if lesion improves, w/ close follow up. -Zyrtec 10 mg daily, until lesion resolves -Cold compress for 2-5 min as needed for itching/burning -Follow up in 2-3 weeks if not resolved -Consider steroid cream if lesion continues, used judiciously -Return precautions given for changes in vision, swelling, fever, drainage, or neck stiffness

## 2022-05-09 NOTE — Progress Notes (Signed)
  SUBJECTIVE:   CHIEF COMPLAINT / HPI:   Red bumps in corner of eye Went to dentist and had tooth pulled and they pyut goggles on her. She started noticing a burning sensation when washing her face, located over her eye and 2 days later she saw the bumps. Bumps itch and burn but are not changing in appearance. This has been going on for almost 3 weeks. No change in vision.   PERTINENT  PMH / PSH:    Patient Care Team: Donney Dice, DO as PCP - General (Family Medicine) Donato Heinz, MD as PCP - Cardiology (Cardiology) OBJECTIVE:  BP 110/62   Pulse 68   Ht 5\' 5"  (1.651 m)   Wt 190 lb (86.2 kg)   SpO2 98%   BMI 31.62 kg/m  Physical Exam Eyes:     General: Vision grossly intact. Gaze aligned appropriately. No visual field deficit or scleral icterus.       Right eye: No foreign body, discharge or hordeolum.     Conjunctiva/sclera:     Right eye: Right conjunctiva is not injected. No chemosis or hemorrhage.    Comments: Slight erythema and edema in medial corner of right eye         ASSESSMENT/PLAN:  Eyelid dermatitis, allergic/contact Assessment & Plan: Patient complaining of eyelid irritation for 3 wks following appt w/ dentist office where she wore goggles for procedure. She noted burning and itching shortly after and saw redness/bumps 2 days later. She hasn't tried anything for this, but has been avoiding touching with her hands. Visual fields intact, slight area of erythema and swelling in medial corner of right eye. Unlikely to be HSV given lack of papules/ulceration, unlikely to be eye infection as eye is normal in appearance, no crusting and lesion located on eye lid. Most likely an eyelid dermatis given symptoms and hx. Will avoid steroid given location, but will try antihistamine to see if lesion improves, w/ close follow up. -Zyrtec 10 mg daily, until lesion resolves -Cold compress for 2-5 min as needed for itching/burning -Follow up in 2-3 weeks if not  resolved -Consider steroid cream if lesion continues, used judiciously -Return precautions given for changes in vision, swelling, fever, drainage, or neck stiffness   Other orders -     Cetirizine HCl; Take 1 tablet (10 mg total) by mouth daily as needed for allergies.  Dispense: 30 tablet; Refill: 5   No follow-ups on file. Holley Bouche, MD 05/09/2022, 2:12 PM PGY-2, [place]

## 2022-05-09 NOTE — Patient Instructions (Addendum)
It was great to see you! Thank you for allowing me to participate in your care!  It looks like you have some irritation of the eye lid. We are going to try an antihistamine to see if this helps it resolve.  Our plans for today:  - Zyrtec 10 mg daily - Use cold compress (cold wash cloth) on eyelid for 2-5 min at a time to help with itching and burning - Follow up in 2-3 weeks if not better  Make appointment to be seen sooner if getting worse (burning/itching more, area growing larger/redder)  Go to Emergency Room if: -Changes in vision -Eyelid swelling -Drainage of fluid -Develop fevers -Develop neck stiffness  Take care and seek immediate care sooner if you develop any concerns.   Dr. Holley Bouche, MD [place]

## 2022-06-09 ENCOUNTER — Other Ambulatory Visit: Payer: Self-pay | Admitting: Family Medicine

## 2022-06-09 ENCOUNTER — Telehealth: Payer: Self-pay

## 2022-06-09 DIAGNOSIS — J4520 Mild intermittent asthma, uncomplicated: Secondary | ICD-10-CM

## 2022-06-09 MED ORDER — DULERA 100-5 MCG/ACT IN AERO: INHALATION_SPRAY | RESPIRATORY_TRACT | 1 refills | Status: DC

## 2022-06-09 NOTE — Telephone Encounter (Signed)
[facility] calls nurse line again in regards to [facility] prescription.   Prescription was sent, however PRN is not allowed with insurance.   Please resend stating 2 puffs with maximum allowed per day.   Will forward back to PCP.

## 2022-06-09 NOTE — Addendum Note (Signed)
Addended by: Steva Colder on: 06/09/2022 11:10 AM   Modules accepted: Orders

## 2022-06-09 NOTE — Telephone Encounter (Signed)
Received call from pharmacy regarding [facility] prescription. Rx needs to have frequency or maximum amount of puffs per day in order to be filled.   Please include this and send new prescription to [facility] on [address].   Veronda Prude, RN

## 2022-07-24 ENCOUNTER — Telehealth: Payer: Self-pay | Admitting: Cardiology

## 2022-07-24 NOTE — Telephone Encounter (Signed)
Spoke to the patient she has been experiencing heart palpitation since April and it is becoming more frequent. Pt stated last night while asleep she started experiencing heart palpitations and mild shortness of breath. Pt stated it did not last long, she is scheduled with MD on 5/24 . Patient voiced understanding.

## 2022-07-24 NOTE — Telephone Encounter (Signed)
Patient c/o Palpitations:  High priority if patient c/o lightheadedness, shortness of breath, or chest pain  How long have you had palpitations/irregular HR/ Afib? Palpitations at night for the since April. Are you having the symptoms now? No   Are you currently experiencing lightheadedness, SOB or CP? At night she feels like she can't breathe, when she is laying down.  Do you have a history of afib (atrial fibrillation) or irregular heart rhythm? Patient has history of irregular heart beat.   Have you checked your BP or HR? (document readings if available): hasn't checked her BP recently.   Are you experiencing any other symptoms? She feels her heart pounding at night.

## 2022-07-25 ENCOUNTER — Ambulatory Visit: Payer: 59 | Attending: Cardiology | Admitting: Cardiology

## 2022-07-25 ENCOUNTER — Ambulatory Visit (INDEPENDENT_AMBULATORY_CARE_PROVIDER_SITE_OTHER): Payer: 59

## 2022-07-25 VITALS — BP 112/80 | HR 55 | Ht 65.0 in | Wt 195.6 lb

## 2022-07-25 DIAGNOSIS — R002 Palpitations: Secondary | ICD-10-CM

## 2022-07-25 DIAGNOSIS — R0602 Shortness of breath: Secondary | ICD-10-CM

## 2022-07-25 DIAGNOSIS — R079 Chest pain, unspecified: Secondary | ICD-10-CM | POA: Diagnosis not present

## 2022-07-25 DIAGNOSIS — Q245 Malformation of coronary vessels: Secondary | ICD-10-CM

## 2022-07-25 DIAGNOSIS — I10 Essential (primary) hypertension: Secondary | ICD-10-CM

## 2022-07-25 DIAGNOSIS — E785 Hyperlipidemia, unspecified: Secondary | ICD-10-CM

## 2022-07-25 NOTE — Progress Notes (Unsigned)
Enrolled patient for a 7 day Zio XT monitor to be mailed to patients home.  

## 2022-07-25 NOTE — Patient Instructions (Addendum)
Medication Instructions:  Your physician recommends that you continue on your current medications as directed. Please refer to the Current Medication list given to you today.  *If you need a refill on your cardiac medications before your next appointment, please call your pharmacy*   Testing/Procedures: Your physician has requested that you have an echocardiogram. Echocardiography is a painless test that uses sound waves to create images of your heart. It provides your doctor with information about the size and shape of your heart and how well your heart's chambers and valves are working. This procedure takes approximately one hour. There are no restrictions for this procedure. Please do NOT wear cologne, perfume, aftershave, or lotions (deodorant is allowed). Please arrive 15 minutes prior to your appointment time. This will take place at [address]   ZIO XT- Long Term Monitor Instructions  Your physician has requested you wear a ZIO patch monitor for 7 days.  This is a single patch monitor. [employer] supplies one patch monitor per enrollment. Additional stickers are not available. Please do not apply patch if you will be having a Nuclear Stress Test,  Echocardiogram, Cardiac CT, MRI, or Chest Xray during the period you would be wearing the  monitor. The patch cannot be worn during these tests. You cannot remove and re-apply the  ZIO XT patch monitor.  Your ZIO patch monitor will be mailed 3 day USPS to your address on file. It may take 3-5 days  to receive your monitor after you have been enrolled.  Once you have received your monitor, please review the enclosed instructions. Your monitor  has already been registered assigning a specific monitor serial # to you.  Billing and Patient Assistance Program Information  We have supplied [employer] with any of your insurance information on file for billing purposes. [employer] offers a sliding scale Patient Assistance Program for  patients that do not have  insurance, or whose insurance does not completely cover the cost of the ZIO monitor.  You must apply for the Patient Assistance Program to qualify for this discounted rate.  To apply, please call [employer] at [phone], select option 4, select option 2, ask to apply for  Patient Assistance Program. Meredeth Ide will ask your household income, and how many people  are in your household. They will quote your out-of-pocket cost based on that information.  [employer] will also be able to set up a 42-month, interest-free payment plan if needed.  Applying the monitor   Shave hair from upper left chest.  Hold abrader disc by orange tab. Rub abrader in 40 strokes over the upper left chest as  indicated in your monitor instructions.  Clean area with 4 enclosed alcohol pads. Let dry.  Apply patch as indicated in monitor instructions. Patch will be placed under collarbone on left  side of chest with arrow pointing upward.  Rub patch adhesive wings for 2 minutes. Remove white label marked "1". Remove the white  label marked "2". Rub patch adhesive wings for 2 additional minutes.  While looking in a mirror, press and release button in center of patch. A small green light will  flash 3-4 times. This will be your only indicator that the monitor has been turned on.  Do not shower for the first 24 hours. You may shower after the first 24 hours.  Press the button if you feel a symptom. You will hear a small click. Record Date, Time and  Symptom in the Patient Logbook.  When you  are ready to remove the patch, follow instructions on the last 2 pages of Patient  Logbook. Stick patch monitor onto the last page of Patient Logbook.  Place Patient Logbook in the blue and white box. Use locking tab on box and tape box closed  securely. The blue and white box has prepaid postage on it. Please place it in the mailbox as  soon as possible. Your physician should have your test results approximately  7 days after the  monitor has been mailed back to [facility].  Call [facility] Customer Care at [phone] if you have questions regarding  your ZIO XT patch monitor. Call them immediately if you see an orange light blinking on your  monitor.  If your monitor falls off in less than 4 days, contact our Monitor department at [phone].  If your monitor becomes loose or falls off after 4 days call [employer] at [phone] for  suggestions on securing your monitor    Follow-Up: At [facility], you and your health needs are our priority.  As part of our continuing mission to provide you with exceptional heart care, we have created designated Provider Care Teams.  These Care Teams include your primary Cardiologist (physician) and Advanced Practice Providers (APPs -  Physician Assistants and Nurse Practitioners) who all work together to provide you with the care you need, when you need it.  We recommend signing up for the patient portal called "MyChart".  Sign up information is provided on this After Visit Summary.  MyChart is used to connect with patients for Virtual Visits (Telemedicine).  Patients are able to view lab/test results, encounter notes, upcoming appointments, etc.  Non-urgent messages can be sent to your provider as well.   To learn more about what you can do with MyChart, go to [email or web address].    Your next appointment:   3 month(s)  Provider:   Little Ishikawa, MD

## 2022-07-25 NOTE — Progress Notes (Signed)
Cardiology Office Note:    Date:  07/25/2022   ID:  Amanda Dickerson, DOB 12/04/1960, MRN [id number]  PCP:  Reece Leader, DO  Cardiologist:  Little Ishikawa, MD  Electrophysiologist:  None   Referring MD: Reece Leader, DO   No chief complaint on file.    History of Present Illness:    Amanda Dickerson is a 62 y.o. 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  She was referred by Dr. Deirdre Priest for evaluation of irregular heartbeat, initially seen on 06/15/2019.  She reports that she started having palpitations in the beginning of March.  Happens every day, can go on for hours.  Does not feel her heart is racing, but feels like heart beat is irregular.  Reports not having any palpitations this morning but had all day yesterday.  No smoking history.  Does not drink coffee, but will have occasional caffeinated soda.  Rare alcohol use.  Family history includes mother has atrial fibrillation and congestive heart failure.  Exercise Myoview 09/2014 showed no ischemia or infarct, EF 71%.  TTE 05/31/2019 showed normal biventricular function without evidence of significant valvular disease.  Labs on 05/27/2019 showed normal potassium, normal TSH.  Zio patch x3 days showed occasional PACs (2.3% of beats), with symptoms appearing to correspond to PACs.  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  Exercise Myoview on 10/03/2020 showed fair exercise capacity (8.5 METS), hypertensive response to exercise (peak BP 210/85), no EKG evidence of ischemia, EF 70%, normal perfusion.  Since last clinic visit, she reports that she has been having palpitations recently.  States that it was occurring every day, particularly at night  and would wake up with palpitations.  Now occurring few times per week.  Feels like heart is racing during episodes.  Can last for 2 to 3 minutes.  Reports having occasional chest pain which she describes as burning but sometimes sharp, lasts for few seconds.  Reports she has been feeling short of breath, particular with lying down at night.  Reports some lightheadedness but denies any syncope.  She walks for exercise, started last week and will walk for 10 to 15 minutes/day.   Past Medical History:  Diagnosis Date   Anemia    Anxiety    Arthritis    KNEES   Asthma    Colitis    Dysfunctional uterine bleeding    Fibromyalgia    GERD (gastroesophageal reflux disease)    Headache(784.0)    History of cardiovascular stress test    ETT-Myoview 7/16:  EF 71%, no ischemia or infarct; Low Risk    Past Surgical History:  Procedure Laterality Date   CESAREAN SECTION  1988   COLONOSCOPY     ENDOMETRIAL ABLATION W/ NOVASURE  08/2011   MULTIPLE TOOTH EXTRACTIONS     TUBAL LIGATION  2000    Current Medications: Current Meds  Medication Sig   amLODipine (NORVASC) 5 MG tablet TAKE 1 TABLET (5 MG TOTAL) BY MOUTH DAILY.   cyclobenzaprine (FLEXERIL) 5 MG tablet Take 0.5-1 tablets (2.5-5 mg total) by mouth at bedtime.   mesalamine (LIALDA) 1.2 g EC tablet Take 2 tablets (2.4 g total) by mouth daily with breakfast.   mometasone-formoterol (DULERA) 100-5 MCG/ACT AERO INHALE 2 PUFFS INTO THE LUNGS  FOR WHEEZING OR SHORTNESS OF BREATH.  2 puffs with maximum allowed per day   omeprazole (PRILOSEC) 40 MG capsule Take 1 capsule (40 mg total) by mouth daily.   venlafaxine (EFFEXOR) 75 MG tablet TAKE 1 TABLET BY MOUTH TWICE A DAY     Allergies:   Ibuprofen and Penicillins   Social History   Socioeconomic History   Marital status: Divorced    Spouse name: Not on file   Number of children: 1   Years of education: 12   Highest education level: Not on file  Occupational History   Occupation: Disabled   Tobacco Use   Smoking status: Never    Passive exposure: Never   Smokeless tobacco: Never  Vaping Use   Vaping Use: Never used  Substance and Sexual Activity   Alcohol use: Yes    Comment: occasionally   Drug use: No   Sexual activity: Yes    Birth control/protection: Surgical  Other Topics Concern   Not on file  Social History Narrative   Lives alone   Caffeine - rarely   Social Determinants of Health   Financial Resource Strain: Not on file  Food Insecurity: Not on file  Transportation Needs: Not on file  Physical Activity: Not on file  Stress: Not on file  Social Connections: Not on file     Family History: The patient's family history includes Colon polyps in her mother and sister; Diabetes in her father; Heart disease in her mother; Hyperlipidemia in her mother; Hypertension in her mother; Other in her mother; Prostate cancer in her father. There is no history of Esophageal cancer, Colon cancer, Stomach cancer, or Rectal cancer.  ROS:   Please see the history of present illness.     All other systems reviewed and are negative.  EKGs/Labs/Other Studies Reviewed:    The following studies were reviewed today:   EKG:   03/05/21:sinus rhythm, rate 59, no ST abnormalities 07/22: NSR, rate 69, no ST abnormalities 08/06/2020: NSR, motion artifact, rate 65, no ST abnormalities 06/15/2019: normal sinus rhythm, rate 56, no ST/T abnormalities  3 day Zio Monitor 07/04/2019: Occasional PACs (2.3% of beats). Patient triggered events appear to correspond to PACs 3 days of data recorded on Zio monitor. Patient had a min HR of 46 bpm, max HR of 154 bpm, and avg HR of 67 bpm. Predominant underlying rhythm was Sinus Rhythm. No VT,  atrial fibrillation, high degree block, or pauses noted. Three runs of SVT, longest lasting 11 beats.  Isolated ventricular ectopy was rare (<1%).  Isolated atrail ectopy was occasional (2.3%). There were 24 triggered events, corresponding to sinus rhythm +/-  PACs.   No significant arrhythmias detected.   TTE 05/31/19:  1. Left ventricular ejection fraction, by estimation, is 65 to 70%. The  left ventricle has normal function. The left ventricle has no regional  wall motion abnormalities. Left ventricular diastolic parameters were  normal.   2. Right ventricular systolic function is normal. The right ventricular  size is normal. There is mildly elevated pulmonary artery systolic  pressure.   3. The mitral valve is normal in structure. Trivial mitral valve  regurgitation. No evidence of mitral stenosis.   4. The aortic valve is tricuspid. Aortic valve regurgitation is not  visualized. No aortic stenosis is present.   Conclusion(s)/Recommendation(s): Normal biventricular function without  evidence of hemodynamically significant valvular heart disease.   Lexiscan Myoview 09/21/2014: The left ventricular ejection fraction is hyperdynamic (>65%). Nuclear stress EF: 71%. There was  no ST segment deviation noted during stress. The study is normal. This is a low risk study.   Normal study with no evidence of infarct or ischemia.     Recent Labs: 09/17/2021: ALT 23 03/24/2022: BUN 13; Creatinine, Ser 0.76; Hemoglobin 13.4; Platelets 245; Potassium 4.5; Sodium 141; TSH 1.200  Recent Lipid Panel    Component Value Date/Time   CHOL 217 (H) 09/17/2021 0913   TRIG 122 09/17/2021 0913   HDL 58 09/17/2021 0913   CHOLHDL 3.7 09/17/2021 0913   CHOLHDL 3.4 07/26/2015 0918   VLDL 28 07/26/2015 0918   LDLCALC 137 (H) 09/17/2021 0913   LDLDIRECT 114 (H) 03/24/2022 1143    Physical Exam:    VS:  BP 112/80   Pulse (!) 55   Ht 5\' 5"  (1.651 m)   Wt 195 lb 9.6 oz (88.7 kg)   SpO2 99%   BMI 32.55 kg/m     Wt Readings from Last 3 Encounters:  07/25/22 195 lb 9.6 oz (88.7 kg)  05/09/22 190 lb (86.2 kg)  03/24/22 184 lb 9.6 oz (83.7 kg)     GEN:  Well nourished, well developed in no acute distress HEENT: Normal NECK: No JVD; No carotid  bruits CARDIAC: RRR, no murmurs, rubs, gallops RESPIRATORY:  Clear to auscultation without rales, wheezing or rhonchi  ABDOMEN: Soft, non-tender, non-distended MUSCULOSKELETAL:  No edema; No deformity  SKIN: Warm and dry NEUROLOGIC:  Alert and oriented x 3 PSYCHIATRIC:  Normal affect   ASSESSMENT:    1. Palpitations   2. Shortness of breath   3. Chest pain of uncertain etiology   4. Anomalous right coronary artery   5. Essential hypertension   6. Hyperlipidemia, unspecified hyperlipidemia type     PLAN:    Chest pain: Atypical in description but does report can occur with exertion.  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   Exercise Myoview on 10/03/2020 showed fair exercise capacity (8.5 METS), hypertensive response to exercise (peak BP 210/85), no EKG evidence of ischemia, EF 70%, normal perfusion.  Dyspnea: Reports worsening dyspnea recently, particularly when lying down at night.  Check echocardiogram  Palpitations: Symptoms corresponded to PACs and Zio patch.  PACs represented 2.3% of beats on monitor.  Normal thyroid function.  TTE on 06/01/2019 showed normal systolic function.   -Reports worsening palpitations recently.  Check Zio patch x 7 days  Prediabetes: A1c 6.1  Snoring: Sleep study unremarkable on 10/01/2020  Hyperlipidemia: LDL 176 on 08/08/2020.  Given 10-year ASCVD risk score 5% and calcium score 0 on 08/02/2020, does not meet indication for statin at this time.  Recommended lifestyle modifications.  LDL improved to 137 on 09/18/2021  Hypertension: on amlodipine 5mg  daily.  Appears controlled  RTC in 3 months      Medication Adjustments/Labs and Tests Ordered: Current medicines are reviewed at length with the patient today.  Concerns regarding medicines are outlined above.   Orders Placed This Encounter  Procedures   LONG TERM MONITOR (3-14 DAYS)   EKG 12-Lead   ECHOCARDIOGRAM COMPLETE    No orders of the defined types were placed in this encounter.    Patient Instructions  Medication Instructions:  Your physician recommends that you continue on your current medications as directed. Please refer to the Current Medication list given to you today.  *If you  need a refill on your cardiac medications before your next appointment, please call your pharmacy*   Testing/Procedures: Your physician has requested that you have an echocardiogram. Echocardiography is a painless test that uses sound waves to create images of your heart. It provides your doctor with information about the size and shape of your heart and how well your heart's chambers and valves are working. This procedure takes approximately one hour. There are no restrictions for this procedure. Please do NOT wear cologne, perfume, aftershave, or lotions (deodorant is allowed). Please arrive 15 minutes prior to your appointment time. This will take place at [address]   ZIO XT- Long Term Monitor Instructions  Your physician has requested you wear a ZIO patch monitor for 14 days.  This is a single patch monitor. [employer] supplies one patch monitor per enrollment. Additional stickers are not available. Please do not apply patch if you will be having a Nuclear Stress Test,  Echocardiogram, Cardiac CT, MRI, or Chest Xray during the period you would be wearing the  monitor. The patch cannot be worn during these tests. You cannot remove and re-apply the  ZIO XT patch monitor.  Your ZIO patch monitor will be mailed 3 day USPS to your address on file. It may take 3-5 days  to receive your monitor after you have been enrolled.  Once you have received your monitor, please review the enclosed instructions. Your monitor  has already been registered assigning a specific monitor serial # to  you.  Billing and Patient Assistance Program Information  We have supplied [employer] with any of your insurance information on file for billing purposes. [employer] offers a sliding scale Patient Assistance Program for patients that do not have  insurance, or whose insurance does not completely cover the cost of the ZIO monitor.  You must apply for the Patient Assistance Program to qualify for this discounted rate.  To apply, please call [employer] at [phone], select option 4, select option 2, ask to apply for  Patient Assistance Program. Meredeth Ide will ask your household income, and how many people  are in your household. They will quote your out-of-pocket cost based on that information.  [employer] will also be able to set up a 55-month, interest-free payment plan if needed.  Applying the monitor   Shave hair from upper left chest.  Hold abrader disc by orange tab. Rub abrader in 40 strokes over the upper left chest as  indicated in your monitor instructions.  Clean area with 4 enclosed alcohol pads. Let dry.  Apply patch as indicated in monitor instructions. Patch will be placed under collarbone on left  side of chest with arrow pointing upward.  Rub patch adhesive wings for 2 minutes. Remove white label marked "1". Remove the white  label marked "2". Rub patch adhesive wings for 2 additional minutes.  While looking in a mirror, press and release button in center of patch. A small green light will  flash 3-4 times. This will be your only indicator that the monitor has been turned on.  Do not shower for the first 24 hours. You may shower after the first 24 hours.  Press the button if you feel a symptom. You will hear a small click. Record Date, Time and  Symptom in the Patient Logbook.  When you are ready to remove the patch, follow instructions on the last 2 pages of Patient  Logbook. Stick patch monitor onto the last page of Patient Logbook.  Place  Patient Logbook in the blue and white box.  Use locking tab on box and tape box closed  securely. The blue and white box has prepaid postage on it. Please place it in the mailbox as  soon as possible. Your physician should have your test results approximately 7 days after the  monitor has been mailed back to [facility].  Call [facility] Customer Care at [phone] if you have questions regarding  your ZIO XT patch monitor. Call them immediately if you see an orange light blinking on your  monitor.  If your monitor falls off in less than 4 days, contact our Monitor department at [phone].  If your monitor becomes loose or falls off after 4 days call [employer] at [phone] for  suggestions on securing your monitor    Follow-Up: At [facility], you and your health needs are our priority.  As part of our continuing mission to provide you with exceptional heart care, we have created designated Provider Care Teams.  These Care Teams include your primary Cardiologist (physician) and Advanced Practice Providers (APPs -  Physician Assistants and Nurse Practitioners) who all work together to provide you with the care you need, when you need it.  We recommend signing up for the patient portal called "MyChart".  Sign up information is provided on this After Visit Summary.  MyChart is used to connect with patients for Virtual Visits (Telemedicine).  Patients are able to view lab/test results, encounter notes, upcoming appointments, etc.  Non-urgent messages can be sent to your provider as well.   To learn more about what you can do with MyChart, go to [email or web address].    Your next appointment:   3 month(s)  Provider:   Little Ishikawa, MD         Signed, Little Ishikawa, MD  07/25/2022 9:55 AM    [facility]

## 2022-08-11 DIAGNOSIS — R0602 Shortness of breath: Secondary | ICD-10-CM | POA: Diagnosis not present

## 2022-08-11 DIAGNOSIS — R002 Palpitations: Secondary | ICD-10-CM | POA: Diagnosis not present

## 2022-08-21 ENCOUNTER — Other Ambulatory Visit: Payer: Self-pay | Admitting: Family Medicine

## 2022-08-21 DIAGNOSIS — Z0000 Encounter for general adult medical examination without abnormal findings: Secondary | ICD-10-CM

## 2022-08-26 ENCOUNTER — Ambulatory Visit (HOSPITAL_COMMUNITY): Payer: 59 | Attending: Cardiovascular Disease

## 2022-08-26 DIAGNOSIS — R002 Palpitations: Secondary | ICD-10-CM | POA: Diagnosis not present

## 2022-08-26 DIAGNOSIS — R0602 Shortness of breath: Secondary | ICD-10-CM | POA: Diagnosis not present

## 2022-08-26 LAB — ECHOCARDIOGRAM COMPLETE
Area-P 1/2: 3.12 cm2
S' Lateral: 2.5 cm

## 2022-09-21 NOTE — Progress Notes (Unsigned)
Cardiology Office Note:    Date:  09/23/2022   ID:  Amanda Dickerson, DOB 07/03/1960, MRN [id number]  PCP:  Para March, DO  Cardiologist:  Little Ishikawa, MD  Electrophysiologist:  None   Referring MD: Reece Leader, DO   Chief Complaint  Patient presents with   Palpitations     History of Present Illness:    Amanda Dickerson is a 62 y.o. 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  She was referred by Dr. Deirdre Priest for evaluation of irregular heartbeat, initially seen on 06/15/2019.  She reports that she started having palpitations in the beginning of March.  Happens every day, can go on for hours.  Does not feel her heart is racing, but feels like heart beat is irregular.  Reports not having any palpitations this morning but had all day yesterday.  No smoking history.  Does not drink coffee, but will have occasional caffeinated soda.  Rare alcohol use.  Family history includes mother has atrial fibrillation and congestive heart failure.  Exercise Myoview 09/2014 showed no ischemia or infarct, EF 71%.  TTE 05/31/2019 showed normal biventricular function without evidence of significant valvular disease.  Labs on 05/27/2019 showed normal potassium, normal TSH.  Zio patch x3 days showed occasional PACs (2.3% of beats), with symptoms appearing to correspond to PACs.  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  Exercise Myoview on 10/03/2020 showed fair exercise capacity (8.5 METS), hypertensive response to exercise (peak BP 210/85), no EKG evidence of ischemia, EF 70%, normal perfusion.  Zio patch x 5.5 days in 07/2022 showed 9 episodes of SVT, longest lasting 27 seconds with average rate 99 bpm.  Echo  08/26/2022 showed EF 60 to 65%, normal RV function, no significant valvular disease.  Since last clinic visit, she reports he is doing okay.  States that she continues to have palpitations and occasional dyspnea.  She denies chest pain.  She walks 3 to 4 days/week for 30 minutes.  States that if walking too fast she will feel palpitations but denies any dyspnea or chest pain.  She reports some lightheadedness but denies any syncope.   Past Medical History:  Diagnosis Date   Anemia    Anxiety    Arthritis    KNEES   Asthma    Colitis    Dysfunctional uterine bleeding    Fibromyalgia    GERD (gastroesophageal reflux disease)    Headache(784.0)    History of cardiovascular stress test    ETT-Myoview 7/16:  EF 71%, no ischemia or infarct; Low Risk    Past Surgical History:  Procedure Laterality Date   CESAREAN SECTION  1988   COLONOSCOPY     ENDOMETRIAL ABLATION W/ NOVASURE  08/2011   MULTIPLE TOOTH EXTRACTIONS     TUBAL LIGATION  2000    Current Medications: Current Meds  Medication Sig   amLODipine (NORVASC) 5 MG tablet TAKE 1 TABLET (5 MG TOTAL) BY MOUTH DAILY.   cyclobenzaprine (FLEXERIL) 5 MG tablet Take 0.5-1 tablets (2.5-5 mg total) by mouth at bedtime.   mesalamine (LIALDA) 1.2 g EC tablet Take 2 tablets (2.4 g total) by mouth daily with breakfast.   mometasone-formoterol (DULERA) 100-5 MCG/ACT AERO INHALE 2 PUFFS INTO THE LUNGS FOR WHEEZING OR SHORTNESS OF  BREATH.  2 puffs with maximum allowed per day   omeprazole (PRILOSEC) 40 MG capsule Take 1 capsule (40 mg total) by mouth daily.   venlafaxine (EFFEXOR) 75 MG tablet TAKE 1 TABLET BY MOUTH TWICE A DAY     Allergies:   Ibuprofen and Penicillins   Social History   Socioeconomic History   Marital status: Divorced    Spouse name: Not on file   Number of children: 1   Years of education: 12   Highest education level: Not on file  Occupational History   Occupation: Disabled  Tobacco Use   Smoking status: Never     Passive exposure: Never   Smokeless tobacco: Never  Vaping Use   Vaping status: Never Used  Substance and Sexual Activity   Alcohol use: Yes    Comment: occasionally   Drug use: No   Sexual activity: Yes    Birth control/protection: Surgical  Other Topics Concern   Not on file  Social History Narrative   Lives alone   Caffeine - rarely   Social Determinants of Health   Financial Resource Strain: Not on file  Food Insecurity: Not on file  Transportation Needs: Not on file  Physical Activity: Not on file  Stress: Not on file  Social Connections: Not on file     Family History: The patient's family history includes Colon polyps in her mother and sister; Diabetes in her father; Heart disease in her mother; Hyperlipidemia in her mother; Hypertension in her mother; Other in her mother; Prostate cancer in her father. There is no history of Esophageal cancer, Colon cancer, Stomach cancer, or Rectal cancer.  ROS:   Please see the history of present illness.     All other systems reviewed and are negative.  EKGs/Labs/Other Studies Reviewed:    The following studies were reviewed today:   EKG:   03/05/21:sinus rhythm, rate 59, no ST abnormalities 07/22: NSR, rate 69, no ST abnormalities 08/06/2020: NSR, motion artifact, rate 65, no ST abnormalities 06/15/2019: normal sinus rhythm, rate 56, no ST/T abnormalities  3 day Zio Monitor 07/04/2019: Occasional PACs (2.3% of beats). Patient triggered events appear to correspond to PACs 3 days of data recorded on Zio monitor. Patient had a min HR of 46 bpm, max HR of 154 bpm, and avg HR of 67 bpm. Predominant underlying rhythm was Sinus Rhythm. No VT,  atrial fibrillation, high degree block, or pauses noted. Three runs of SVT, longest lasting 11 beats.  Isolated ventricular ectopy was rare (<1%).  Isolated atrail ectopy was occasional (2.3%). There were 24 triggered events, corresponding to sinus rhythm +/- PACs.   No significant arrhythmias  detected.   TTE 05/31/19:  1. Left ventricular ejection fraction, by estimation, is 65 to 70%. The  left ventricle has normal function. The left ventricle has no regional  wall motion abnormalities. Left ventricular diastolic parameters were  normal.   2. Right ventricular systolic function is normal. The right ventricular  size is normal. There is mildly elevated pulmonary artery systolic  pressure.   3. The mitral valve is normal in structure. Trivial mitral valve  regurgitation. No evidence of mitral stenosis.   4. The aortic valve is tricuspid. Aortic valve regurgitation is not  visualized. No aortic stenosis is present.   Conclusion(s)/Recommendation(s): Normal biventricular function without  evidence of hemodynamically significant valvular heart disease.   Lexiscan Myoview 09/21/2014: The left ventricular ejection fraction is hyperdynamic (>65%). Nuclear stress EF: 71%. There was no ST segment deviation noted  during stress. The study is normal. This is a low risk study.   Normal study with no evidence of infarct or ischemia.     Recent Labs: 03/24/2022: BUN 13; Creatinine, Ser 0.76; Hemoglobin 13.4; Platelets 245; Potassium 4.5; Sodium 141; TSH 1.200  Recent Lipid Panel    Component Value Date/Time   CHOL 217 (H) 09/17/2021 0913   TRIG 122 09/17/2021 0913   HDL 58 09/17/2021 0913   CHOLHDL 3.7 09/17/2021 0913   CHOLHDL 3.4 07/26/2015 0918   VLDL 28 07/26/2015 0918   LDLCALC 137 (H) 09/17/2021 0913   LDLDIRECT 114 (H) 03/24/2022 1143    Physical Exam:    VS:  BP 126/78 (BP Location: Left Arm, Patient Position: Sitting, Cuff Size: Large)   Pulse 64   Ht 5\' 5"  (1.651 m)   Wt 199 lb 3.2 oz (90.4 kg)   SpO2 97%   BMI 33.15 kg/m     Wt Readings from Last 3 Encounters:  09/23/22 199 lb 3.2 oz (90.4 kg)  07/25/22 195 lb 9.6 oz (88.7 kg)  05/09/22 190 lb (86.2 kg)     GEN:  Well nourished, well developed in no acute distress HEENT: Normal NECK: No JVD; No  carotid bruits CARDIAC: RRR, no murmurs, rubs, gallops RESPIRATORY:  Clear to auscultation without rales, wheezing or rhonchi  ABDOMEN: Soft, non-tender, non-distended MUSCULOSKELETAL:  No edema; No deformity  SKIN: Warm and dry NEUROLOGIC:  Alert and oriented x 3 PSYCHIATRIC:  Normal affect   ASSESSMENT:    1. Chest pain of uncertain etiology   2. Shortness of breath   3. Palpitations   4. Essential hypertension   5. Hyperlipidemia, unspecified hyperlipidemia type      PLAN:    Chest pain/dyspnea: Atypical in description but does report can occur with exertion.  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   Exercise Myoview on 10/03/2020 showed fair exercise capacity (8.5 METS), hypertensive response to exercise (peak BP 210/85), no EKG evidence of ischemia, EF 70%, normal perfusion.  Echo 08/26/2022 showed EF 60 to 65%, normal RV function, no significant valvular disease.  Palpitations: Symptoms corresponded to PACs and Zio patch.  PACs represented 2.3% of beats on monitor.  Normal thyroid function.  TTE on 06/01/2019 showed normal systolic function.  Reported worsening palpitations and underwentZio patch x 5.5 days in 07/2022 showed 9 episodes of SVT, longest lasting 27 seconds with average rate 99 bpm.   -She reports that she continues to have palpitations intermittently.  Discussed trial of Toprol-XL 25 mg daily but she wishes to hold off at this time.  If worsening palpitations, would recommend starting beta-blocker  Prediabetes: A1c 6.1 03/2022  Snoring: Sleep study unremarkable on 10/01/2020  Hyperlipidemia: LDL 176 on 08/08/2020.  Given 10-year ASCVD risk score 5% and calcium score 0 on 08/02/2020, does not meet indication for statin at this time.  Recommended lifestyle modifications.  LDL improved to  137 on 09/18/2021  Hypertension: on amlodipine 5mg  daily.  Appears controlled  RTC in 6 months     Medication Adjustments/Labs and Tests Ordered: Current medicines are reviewed at length with the patient today.  Concerns regarding medicines are outlined above.  No orders of the defined types were placed in this encounter.   No orders of the defined types were placed in this encounter.  Patient Instructions  Medication Instructions:  Continue same medications *If you need a refill on your cardiac medications before your next appointment, please call your pharmacy*   Lab Work: None ordered   Testing/Procedures: None ordered   Follow-Up: At [facility], you and your health needs are our priority.  As part of our continuing mission to provide you with exceptional heart care, we have created designated Provider Care Teams.  These Care Teams include your primary Cardiologist (physician) and Advanced Practice Providers (APPs -  Physician Assistants and Nurse Practitioners) who all work together to provide you with the care you need, when you need it.  We recommend signing up for the patient portal called "MyChart".  Sign up information is provided on this After Visit Summary.  MyChart is used to connect with patients for Virtual Visits (Telemedicine).  Patients are able to view lab/test results, encounter notes, upcoming appointments, etc.  Non-urgent messages can be sent to your provider as well.   To learn more about what you can do with MyChart, go to [email or web address].    Your next appointment:  6 months    Call in Sept to schedule Jan appointment    Provider:  Dr.Ajee Heasley        Signed, Little Ishikawa, MD  09/23/2022 9:30 AM    [facility]

## 2022-09-23 ENCOUNTER — Ambulatory Visit: Payer: 59 | Attending: Cardiology | Admitting: Cardiology

## 2022-09-23 ENCOUNTER — Encounter: Payer: Self-pay | Admitting: Cardiology

## 2022-09-23 VITALS — BP 126/78 | HR 64 | Ht 65.0 in | Wt 199.2 lb

## 2022-09-23 DIAGNOSIS — R079 Chest pain, unspecified: Secondary | ICD-10-CM

## 2022-09-23 DIAGNOSIS — R002 Palpitations: Secondary | ICD-10-CM

## 2022-09-23 DIAGNOSIS — I10 Essential (primary) hypertension: Secondary | ICD-10-CM | POA: Diagnosis not present

## 2022-09-23 DIAGNOSIS — E785 Hyperlipidemia, unspecified: Secondary | ICD-10-CM

## 2022-09-23 DIAGNOSIS — R0602 Shortness of breath: Secondary | ICD-10-CM

## 2022-09-23 NOTE — Patient Instructions (Signed)
Medication Instructions:  Continue same medications *If you need a refill on your cardiac medications before your next appointment, please call your pharmacy*   Lab Work: None ordered   Testing/Procedures: None ordered   Follow-Up: At [facility], you and your health needs are our priority.  As part of our continuing mission to provide you with exceptional heart care, we have created designated Provider Care Teams.  These Care Teams include your primary Cardiologist (physician) and Advanced Practice Providers (APPs -  Physician Assistants and Nurse Practitioners) who all work together to provide you with the care you need, when you need it.  We recommend signing up for the patient portal called "MyChart".  Sign up information is provided on this After Visit Summary.  MyChart is used to connect with patients for Virtual Visits (Telemedicine).  Patients are able to view lab/test results, encounter notes, upcoming appointments, etc.  Non-urgent messages can be sent to your provider as well.   To learn more about what you can do with MyChart, go to [email or web address].    Your next appointment:  6 months    Call in Sept to schedule Jan appointment    Provider:  Dr.Schumann

## 2022-10-06 ENCOUNTER — Ambulatory Visit
Admission: RE | Admit: 2022-10-06 | Discharge: 2022-10-06 | Disposition: A | Discharge status: Home / Self Care | Payer: 59 | Source: Ambulatory Visit | Attending: Family Medicine | Admitting: Family Medicine

## 2022-10-06 DIAGNOSIS — Z0000 Encounter for general adult medical examination without abnormal findings: Secondary | ICD-10-CM

## 2022-10-06 DIAGNOSIS — Z1231 Encounter for screening mammogram for malignant neoplasm of breast: Secondary | ICD-10-CM | POA: Diagnosis not present

## 2022-10-20 ENCOUNTER — Other Ambulatory Visit: Payer: Self-pay | Admitting: Gastroenterology

## 2022-10-21 ENCOUNTER — Other Ambulatory Visit: Payer: Self-pay

## 2022-10-21 DIAGNOSIS — J4520 Mild intermittent asthma, uncomplicated: Secondary | ICD-10-CM

## 2022-10-21 MED ORDER — DULERA 100-5 MCG/ACT IN AERO
INHALATION_SPRAY | RESPIRATORY_TRACT | 0 refills | Status: AC
Start: 2022-10-21 — End: ?

## 2022-10-24 ENCOUNTER — Other Ambulatory Visit: Payer: Self-pay

## 2022-10-24 DIAGNOSIS — R232 Flushing: Secondary | ICD-10-CM

## 2022-10-24 MED ORDER — VENLAFAXINE HCL 75 MG PO TABS: 75.0000 mg | ORAL_TABLET | Freq: Two times a day (BID) | ORAL | 1 refills | Status: DC

## 2022-12-03 ENCOUNTER — Other Ambulatory Visit: Payer: Self-pay | Admitting: Family Medicine

## 2022-12-03 DIAGNOSIS — J4520 Mild intermittent asthma, uncomplicated: Secondary | ICD-10-CM

## 2022-12-09 NOTE — Telephone Encounter (Signed)
Called and scheduled patient for appointment.   Thank you Amanda Dickerson

## 2022-12-19 ENCOUNTER — Other Ambulatory Visit: Payer: Self-pay | Admitting: Gastroenterology

## 2022-12-19 NOTE — Telephone Encounter (Signed)
Patient called requesting a refill for Mesalamine

## 2022-12-22 ENCOUNTER — Ambulatory Visit
Admission: RE | Admit: 2022-12-22 | Discharge: 2022-12-22 | Disposition: A | Discharge status: Home / Self Care | Payer: 59 | Source: Ambulatory Visit | Attending: Family Medicine | Admitting: Family Medicine

## 2022-12-22 ENCOUNTER — Ambulatory Visit: Payer: 59 | Admitting: Family Medicine

## 2022-12-22 ENCOUNTER — Telehealth: Payer: Self-pay | Admitting: Family Medicine

## 2022-12-22 ENCOUNTER — Encounter: Payer: Self-pay | Admitting: Family Medicine

## 2022-12-22 ENCOUNTER — Other Ambulatory Visit (HOSPITAL_COMMUNITY)
Admission: RE | Admit: 2022-12-22 | Discharge: 2022-12-22 | Disposition: A | Discharge status: Home / Self Care | Payer: 59 | Source: Ambulatory Visit | Attending: Family Medicine | Admitting: Family Medicine

## 2022-12-22 ENCOUNTER — Encounter: Payer: Self-pay | Admitting: Gastroenterology

## 2022-12-22 VITALS — BP 123/70 | HR 62 | Ht 65.0 in | Wt 200.6 lb

## 2022-12-22 DIAGNOSIS — M25572 Pain in left ankle and joints of left foot: Secondary | ICD-10-CM

## 2022-12-22 DIAGNOSIS — Z01419 Encounter for gynecological examination (general) (routine) without abnormal findings: Secondary | ICD-10-CM | POA: Insufficient documentation

## 2022-12-22 DIAGNOSIS — Z0000 Encounter for general adult medical examination without abnormal findings: Secondary | ICD-10-CM | POA: Diagnosis not present

## 2022-12-22 DIAGNOSIS — R7303 Prediabetes: Secondary | ICD-10-CM | POA: Diagnosis not present

## 2022-12-22 DIAGNOSIS — Z113 Encounter for screening for infections with a predominantly sexual mode of transmission: Secondary | ICD-10-CM | POA: Diagnosis not present

## 2022-12-22 DIAGNOSIS — R42 Dizziness and giddiness: Secondary | ICD-10-CM | POA: Diagnosis not present

## 2022-12-22 DIAGNOSIS — Z1151 Encounter for screening for human papillomavirus (HPV): Secondary | ICD-10-CM | POA: Diagnosis not present

## 2022-12-22 DIAGNOSIS — K629 Disease of anus and rectum, unspecified: Secondary | ICD-10-CM | POA: Insufficient documentation

## 2022-12-22 DIAGNOSIS — Z124 Encounter for screening for malignant neoplasm of cervix: Secondary | ICD-10-CM | POA: Diagnosis not present

## 2022-12-22 DIAGNOSIS — H8190 Unspecified disorder of vestibular function, unspecified ear: Secondary | ICD-10-CM

## 2022-12-22 DIAGNOSIS — M7989 Other specified soft tissue disorders: Secondary | ICD-10-CM | POA: Diagnosis not present

## 2022-12-22 DIAGNOSIS — M7732 Calcaneal spur, left foot: Secondary | ICD-10-CM | POA: Diagnosis not present

## 2022-12-22 LAB — POCT GLYCOSYLATED HEMOGLOBIN (HGB A1C): HbA1c, POC (controlled diabetic range): 6.9 % (ref 0.0–7.0)

## 2022-12-22 NOTE — Assessment & Plan Note (Signed)
Completed Pap smear with HPV cotesting today, added GC chlamydia. Patient declined vaccinations today. She is up-to-date on colonoscopy and mammogram.

## 2022-12-22 NOTE — Patient Instructions (Signed)
It was great to see you today! Thank you for choosing [facility] for your primary care. Amanda Dickerson was seen for health maintenance, ankle pain, and vertigo.  Today we addressed: We completed your Pap smear today.  I will message you on MyChart or by letter if labs are normal and call you if they are abnormal. I have ordered a lab to check your cholesterol.  You will need to go across the street to [facility] to have this collected. [address], [place], [place] [address] I have ordered an x-ray of your left ankle.  You will need to go to [facility] to have this completed [address] [place], [place], [place] [address] I have referred you to vestibular rehab for your vertigo.  They should call you to schedule an appointment.  If symptoms persist in the future, we can consider medication at that time for very severe episodes.  If you haven't already, sign up for My Chart to have easy access to your labs results, and communication with your primary care physician.  We are checking some labs today. If they are abnormal, I will call you. If they are normal, I will send you a MyChart message (if it is active) or a letter in the mail. If you do not hear about your labs in the next 2 weeks, please call the office.  You should return to our clinic No follow-ups on file. Please arrive 15 minutes before your appointment to ensure smooth check in process.  We appreciate your efforts in making this happen.  Thank you for allowing me to participate in your care, Para March, DO 12/22/2022, 10:11 AM PGY-1, [facility]

## 2022-12-22 NOTE — Assessment & Plan Note (Addendum)
Small area of skin breakdown outside of anus.  Patient mostly asymptomatic, notices it sometimes when she wipes.  Has been told that this is likely related to her Crohn's disease.  Last colonoscopy in 2020, last saw GI November 2023. - Follow-up with GI as scheduled

## 2022-12-22 NOTE — Telephone Encounter (Signed)
Called patient regarding A1c result.  She would like to continue strict lifestyle modifications and recheck in 3 months.  Discussed metformin but patient would like to wait and try lifestyle changes first.  She plans to have her lipids drawn at that appointment.  Also advised her to follow-up with GI, it looks like they wanted her to follow-up in a year at her last appointment.

## 2022-12-22 NOTE — Assessment & Plan Note (Signed)
Point-of-care A1c pending, did not come back by time the patient left.  Will call with results.  Lipid panel sent to [facility]

## 2022-12-22 NOTE — Assessment & Plan Note (Signed)
Has been going on for several years, but had prolonged episode last week. - Placed referral to vestibular rehab

## 2022-12-22 NOTE — Assessment & Plan Note (Signed)
Left ankle pain x 2 to 3 months.  Able to ambulate without difficulty.  Intermittent discomfort with weightbearing.  Unremarkable exam. - Left ankle x-ray ordered - Tylenol/ibuprofen as needed for pain

## 2022-12-22 NOTE — Progress Notes (Signed)
    SUBJECTIVE:   CHIEF COMPLAINT / HPI:   Presents for Pap smear.  Last 1 was in 2019, reportedly normal. No vaginal discharge, itching, or burning.  Also to check A1c and cholesterol.  Last was 6.1 in January 2024.  She has been working on lifestyle modifications.  Last lipid panel July 2023, at that time. LDL 114 03/2022.   Left ankle pain Patient states that 2 to 3 months ago she felt her left ankle pop while at rest.  Since then she has had pain intermittently, mainly with weightbearing.  States that typically, when she sits for long period of time and then goes to stand up she feels like she has to "get her ankle going" in order to walk.  Vertigo Patient reports that she has had room spinning vertigo intermittently for the last few years.  States that last week she had an episode lasting 30 to 45 minutes with associated nausea.  States that this episode lasted a lot longer than her typical episodes.  Lesion States she occasionally has an area of skin breakdown around her anus.  She occasionally notices the lesion when she wipes, has been told this is related to her proctosigmoiditis.  PERTINENT  PMH / PSH: Proctosigmoiditis, GERD, episodic recurrent vertigo, hyperlipidemia, prediabetes  OBJECTIVE:   BP 123/70   Pulse 62   Ht 5\' 5"  (1.651 m)   Wt 200 lb 9.6 oz (91 kg)   SpO2 100%   BMI 33.38 kg/m   GEN: Well-developed well-nourished, no acute distress MSK: No edema bilateral lower extremities.  Able to ambulate independently without difficulty.  Some pain with extension of left ankle, no pain with flexion.  No tenderness to palpation over bilateral ankles. Pelvic - completed with CMA Alexis as chaperone  VULVA: normal appearing vulva with no masses, tenderness or lesions  VAGINA: normal appearing vagina with normal color and discharge, no lesions  CERVIX: normal appearing cervix with thin clear/white discharge, no lesions, no CMT.  Small 1 cm area of some skin breakdown to left  side of anus, no bleeding or discharge  Thin prep pap is done with HR HPV cotesting  UTERUS: uterus is felt to be normal size, shape, consistency and nontender   ADNEXA: No adnexal masses or tenderness noted.   ASSESSMENT/PLAN:   Healthcare maintenance Completed Pap smear with HPV cotesting today, added GC chlamydia. Patient declined vaccinations today. She is up-to-date on colonoscopy and mammogram.  Left ankle pain Left ankle pain x 2 to 3 months.  Able to ambulate without difficulty.  Intermittent discomfort with weightbearing.  Unremarkable exam. - Left ankle x-ray ordered - Tylenol/ibuprofen as needed for pain  Episodic recurrent vertigo Has been going on for several years, but had prolonged episode last week. - Placed referral to vestibular rehab  Anal lesion Small area of skin breakdown outside of anus.  Patient mostly asymptomatic, notices it sometimes when she wipes.  Has been told that this is likely related to her Crohn's disease.  Last colonoscopy in 2020, last saw GI November 2023. - Follow-up with GI as scheduled  Prediabetes Point-of-care A1c pending, did not come back by time the patient left.  Will call with results.  Lipid panel sent to [facility]   Para March, DO [facility]

## 2022-12-29 LAB — CYTOLOGY - PAP
Chlamydia: NEGATIVE
Comment: NEGATIVE
Comment: NEGATIVE
Comment: NORMAL
Diagnosis: REACTIVE
Diagnosis: NEGATIVE
High risk HPV: NEGATIVE
Neisseria Gonorrhea: NEGATIVE

## 2023-01-09 ENCOUNTER — Other Ambulatory Visit: Payer: Self-pay | Admitting: Family Medicine

## 2023-01-09 DIAGNOSIS — M25572 Pain in left ankle and joints of left foot: Secondary | ICD-10-CM

## 2023-01-20 ENCOUNTER — Ambulatory Visit (INDEPENDENT_AMBULATORY_CARE_PROVIDER_SITE_OTHER): Payer: 59 | Admitting: Family Medicine

## 2023-01-20 ENCOUNTER — Encounter: Payer: Self-pay | Admitting: Family Medicine

## 2023-01-20 VITALS — BP 126/88 | Ht 65.0 in | Wt 200.0 lb

## 2023-01-20 DIAGNOSIS — M7662 Achilles tendinitis, left leg: Secondary | ICD-10-CM

## 2023-01-20 DIAGNOSIS — R269 Unspecified abnormalities of gait and mobility: Secondary | ICD-10-CM | POA: Diagnosis not present

## 2023-01-20 NOTE — Progress Notes (Signed)
DATE OF VISIT: 01/20/2023        Amanda Dickerson DOB: 1960/04/03 MRN: [id number]  CC:  Lt ankle pain  History- Amanda Dickerson is a 62 y.o. female for evaluation and treatment of left ankle pain Seen by PCP 12/22/22 and referred for sports med eval - visit notes from 12/22/22 reviewed in detail today - having Lt ankle pain x 2-3 months - no injury/trauma, but "felt a pop" when walking - some mild swelling - xray 01/08/23 showing: mild medial soft tissue swelling and calcaneal & plantar heel spurs  Reports pain along the posterior aspect of the foot/ankle and now bottom of the foot Pain along the achilles area No meds for this Using heat prn No new shoes - wears different types of sneakers  Hx of posterior tib tendonitis - last seen by [place] 2016 - given sports insoles with scaphoid pads  Past Medical History Past Medical History:  Diagnosis Date   Anemia    Anxiety    Arthritis    KNEES   Asthma    Colitis    Dysfunctional uterine bleeding    Fibromyalgia    GERD (gastroesophageal reflux disease)    Headache(784.0)    History of cardiovascular stress test    ETT-Myoview 7/16:  EF 71%, no ischemia or infarct; Low Risk    Past Surgical History Past Surgical History:  Procedure Laterality Date   CESAREAN SECTION  1988   COLONOSCOPY     ENDOMETRIAL ABLATION W/ NOVASURE  08/2011   MULTIPLE TOOTH EXTRACTIONS     TUBAL LIGATION  2000    Medications Current Outpatient Medications  Medication Sig Dispense Refill   amLODipine (NORVASC) 5 MG tablet TAKE 1 TABLET (5 MG TOTAL) BY MOUTH DAILY. 90 tablet 3   cyclobenzaprine (FLEXERIL) 5 MG tablet Take 0.5-1 tablets (2.5-5 mg total) by mouth at bedtime. 30 tablet 1   mesalamine (LIALDA) 1.2 g EC tablet TAKE 2 TABLETS BY MOUTH DAILY WITH BREAKFAST. 180 tablet 0   mometasone-formoterol (DULERA) 100-5 MCG/ACT AERO INHALE 2 PUFFS INTO THE LUNGS FOR WHEEZING OR SHORTNESS OF BREATH. 2 PUFFS WITH MAXIMUM ALLOWED PER DAY 13 g 0    omeprazole (PRILOSEC) 40 MG capsule Take 1 capsule (40 mg total) by mouth daily. 90 capsule 3   venlafaxine (EFFEXOR) 75 MG tablet Take 1 tablet (75 mg total) by mouth 2 (two) times daily. 180 tablet 1   No current facility-administered medications for this visit.    Allergies is allergic to ibuprofen and penicillins.  Family History - reviewed per EMR and intake form  Social History   reports current alcohol use.  reports that she has never smoked. She has never been exposed to tobacco smoke. She has never used smokeless tobacco.  reports no history of drug use.   EXAM: Vitals: BP 126/88   Ht 5\' 5"  (1.651 m)   Wt 200 lb (90.7 kg)   BMI 33.28 kg/m  General: AOx3, NAD, pleasant SKIN: no rashes or lesions, skin clean, dry, intact MSK: FOOT/ANKLE: overpronation bilaterally with longitudinal arch collapse.  Mild soft tissue swelling along the medial left ankle, no increased redness or warmth.  No bony TTP along medial/lateral malleolus, base of 5th MT.  (+)TTP along insertion of achilles on the left, no palpable defects.  No TTP along the PF. Rt foot/ankle without bony TTP, no TTP along achilles Gait: b/l overpronation  NEURO: sensation intact to light touch lower ext  bilaterally VASC: pulses 2+ and symmetric DP/PT  bilaterally,  IMAGING: XRAYS:  LT ANKLE XR 01/08/23 personally reviewed and interpreted by me today showing: - Calcaneal and plantar heel spurs - mild medial soft tissue swelling - no other significant abnormalities   Assessment & Plan Insertional tendinopathy of left Achilles tendon Left insertional Achilles tendon pain consistent with Achilles tendinopathy.  Ankle x-ray showing calcaneal heel spur.  Likely aggravated by altered gait with overpronation  PLAN: - prior visit notes from visit with PCP reviewed - prior visit notes from visits with [place] in 2016 reviewed - Fitted with Sports Insoles with small scaphoid pads - these were comfortable and she had more  neutral stance/gait - HEP provided - Recommend Voltaren gel OTC - apply to affected area every 6-8 hours as needed.  Explained how to take medication & side effects.  Should avoid taking with PO NSAIDs. - cont heat prn - wear supportive shoes - avoid walking barefoot - f/u 6 weeks - consider u/s and possible topical nitroglycerin if not improving Abnormality of gait Bilateral overpronation which is likely contributing to left-sided Achilles and foot/ankle pain  PLAN: - Fitted with Sports Insoles with small scaphoid pads - these were comfortable and she had more neutral stance/gait - wear supportive shoes - avoid walking barefoot - f/u 6 weeks - could consider custom orthotics in the future if needed  Patient expressed understanding & agreement with above.  Encounter Diagnoses  Name Primary?   Insertional tendinopathy of left Achilles tendon Yes   Abnormality of gait     No orders of the defined types were placed in this encounter.   No orders of the defined types were placed in this encounter.

## 2023-01-20 NOTE — Patient Instructions (Signed)
It was a pleasure seeing you today.  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  This is a topical anti-inflammatory medication to help with pain and inflammation/swelling.  You can purchase this at your local pharmacy over-the-counter. - you should do the exercises daily that we provided - you should follow-up with [place] in 6-weeks to re-examine or sooner as needed - Don't hesitate to reach out with any questions

## 2023-01-30 ENCOUNTER — Other Ambulatory Visit: Payer: Self-pay | Admitting: Family Medicine

## 2023-01-30 DIAGNOSIS — J4520 Mild intermittent asthma, uncomplicated: Secondary | ICD-10-CM

## 2023-03-05 ENCOUNTER — Other Ambulatory Visit: Payer: Self-pay | Admitting: Gastroenterology

## 2023-03-05 ENCOUNTER — Other Ambulatory Visit: Payer: Self-pay | Admitting: Cardiology

## 2023-03-10 ENCOUNTER — Ambulatory Visit: Payer: 59 | Admitting: Family Medicine

## 2023-03-31 ENCOUNTER — Ambulatory Visit: Payer: 59 | Admitting: Gastroenterology

## 2023-04-01 ENCOUNTER — Ambulatory Visit: Payer: 59 | Attending: Cardiology | Admitting: Cardiology

## 2023-04-01 ENCOUNTER — Encounter: Payer: Self-pay | Admitting: Cardiology

## 2023-04-01 VITALS — BP 117/60 | HR 62 | Ht 65.0 in | Wt 198.0 lb

## 2023-04-01 DIAGNOSIS — R002 Palpitations: Secondary | ICD-10-CM

## 2023-04-01 DIAGNOSIS — E785 Hyperlipidemia, unspecified: Secondary | ICD-10-CM

## 2023-04-01 DIAGNOSIS — Q245 Malformation of coronary vessels: Secondary | ICD-10-CM

## 2023-04-01 DIAGNOSIS — I10 Essential (primary) hypertension: Secondary | ICD-10-CM

## 2023-04-01 DIAGNOSIS — R0602 Shortness of breath: Secondary | ICD-10-CM | POA: Diagnosis not present

## 2023-04-01 MED ORDER — METOPROLOL SUCCINATE ER 25 MG PO TB24: 25.0000 mg | ORAL_TABLET | Freq: Every day | ORAL | 3 refills | Status: DC

## 2023-04-01 NOTE — Progress Notes (Signed)
Cardiology Office Note:    Date:  04/01/2023   ID:  ALIJAH Dickerson, DOB [birth year], MRN [id number]  PCP:  Para March, DO  Cardiologist:  Little Ishikawa, MD  Electrophysiologist:  None   Referring MD: Para March, DO   Chief Complaint  Patient presents with   Palpitations    History of Present Illness:    Amanda Dickerson is a 63 y.o. 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  She was referred by Dr. Deirdre Priest for evaluation of irregular heartbeat, initially seen on 06/15/2019.    Exercise Myoview 09/2014 showed no ischemia or infarct, EF 71%.  TTE 05/31/2019 showed normal biventricular function without evidence of significant valvular disease.  Labs on 05/27/2019 showed normal potassium, normal TSH.  Zio patch x3 days showed occasional PACs (2.3% of beats), with symptoms appearing to correspond to PACs.  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  Exercise Myoview on 10/03/2020 showed fair exercise capacity (8.5 METS), hypertensive response to exercise (peak BP 210/85), no EKG evidence of ischemia, EF 70%, normal perfusion.  Zio patch x 5.5 days in 07/2022 showed 9 episodes of SVT, longest lasting 27 seconds with average rate 99 bpm.  Echo 08/26/2022 showed EF 60 to 65%, normal RV function, no significant valvular disease.  Since last clinic visit, she reports she has been having worsening palpitations.  Also has been feeling very fatigued.  She has been going for walks for exercise but feels short of breath.   Past Medical History:  Diagnosis Date   Anemia    Anxiety    Arthritis    KNEES   Asthma    Colitis    Dysfunctional uterine bleeding     Fibromyalgia    GERD (gastroesophageal reflux disease)    Headache(784.0)    History of cardiovascular stress test    ETT-Myoview 7/16:  EF 71%, no ischemia or infarct; Low Risk    Past Surgical History:  Procedure Laterality Date   CESAREAN SECTION  1988   COLONOSCOPY     ENDOMETRIAL ABLATION W/ NOVASURE  08/2011   MULTIPLE TOOTH EXTRACTIONS     TUBAL LIGATION  2000    Current Medications: Current Meds  Medication Sig   amLODipine (NORVASC) 5 MG tablet TAKE 1 TABLET (5 MG TOTAL) BY MOUTH DAILY.   cyclobenzaprine (FLEXERIL) 5 MG tablet Take 0.5-1 tablets (2.5-5 mg total) by mouth at bedtime.   mesalamine (LIALDA) 1.2 g EC tablet TAKE 2 TABLETS BY MOUTH DAILY WITH BREAKFAST.   metoprolol succinate (TOPROL XL) 25 MG 24 hr tablet Take 1 tablet (25 mg total) by mouth daily.   mometasone-formoterol (DULERA) 100-5 MCG/ACT AERO INHALE 2 PUFFS INTO THE LUNGS FOR WHEEZING OR SHORTNESS OF BREATH   omeprazole (PRILOSEC) 40 MG capsule TAKE 1 CAPSULE (40 MG TOTAL) BY MOUTH DAILY.   venlafaxine (EFFEXOR) 75 MG tablet Take 1 tablet (75 mg total) by mouth 2 (two) times daily.     Allergies:   Ibuprofen and Penicillins   Social History   Socioeconomic History   Marital status: Divorced    Spouse name: Not on file   Number of children: 1   Years of education: 12   Highest education level: Not on file  Occupational History  Occupation: Disabled  Tobacco Use   Smoking status: Never    Passive exposure: Never   Smokeless tobacco: Never  Vaping Use   Vaping status: Never Used  Substance and Sexual Activity   Alcohol use: Yes    Comment: occasionally   Drug use: No   Sexual activity: Yes    Birth control/protection: Surgical  Other Topics Concern   Not on file  Social History Narrative   Lives alone   Caffeine - rarely   Social Drivers of Corporate investment banker Strain: Not on file  Food Insecurity: Not on file  Transportation Needs: Not on file  Physical Activity: Not on  file  Stress: Not on file  Social Connections: Not on file     Family History: The patient's family history includes Colon polyps in her mother and sister; Diabetes in her father; Heart disease in her mother; Hyperlipidemia in her mother; Hypertension in her mother; Other in her mother; Prostate cancer in her father. There is no history of Esophageal cancer, Colon cancer, Stomach cancer, or Rectal cancer.  ROS:   Please see the history of present illness.     All other systems reviewed and are negative.  EKGs/Labs/Other Studies Reviewed:    The following studies were reviewed today:   EKG:   04/01/23: NSR, rate 62, no ST abnormalities 03/05/21:sinus rhythm, rate 59, no ST abnormalities 07/22: NSR, rate 69, no ST abnormalities 08/06/2020: NSR, motion artifact, rate 65, no ST abnormalities 06/15/2019: normal sinus rhythm, rate 56, no ST/T abnormalities  3 day Zio Monitor 07/04/2019: Occasional PACs (2.3% of beats). Patient triggered events appear to correspond to PACs 3 days of data recorded on Zio monitor. Patient had a min HR of 46 bpm, max HR of 154 bpm, and avg HR of 67 bpm. Predominant underlying rhythm was Sinus Rhythm. No VT,  atrial fibrillation, high degree block, or pauses noted. Three runs of SVT, longest lasting 11 beats.  Isolated ventricular ectopy was rare (<1%).  Isolated atrail ectopy was occasional (2.3%). There were 24 triggered events, corresponding to sinus rhythm +/- PACs.   No significant arrhythmias detected.   TTE 05/31/19:  1. Left ventricular ejection fraction, by estimation, is 65 to 70%. The  left ventricle has normal function. The left ventricle has no regional  wall motion abnormalities. Left ventricular diastolic parameters were  normal.   2. Right ventricular systolic function is normal. The right ventricular  size is normal. There is mildly elevated pulmonary artery systolic  pressure.   3. The mitral valve is normal in structure. Trivial mitral valve   regurgitation. No evidence of mitral stenosis.   4. The aortic valve is tricuspid. Aortic valve regurgitation is not  visualized. No aortic stenosis is present.   Conclusion(s)/Recommendation(s): Normal biventricular function without  evidence of hemodynamically significant valvular heart disease.   Lexiscan Myoview 09/21/2014: The left ventricular ejection fraction is hyperdynamic (>65%). Nuclear stress EF: 71%. There was no ST segment deviation noted during stress. The study is normal. This is a low risk study.   Normal study with no evidence of infarct or ischemia.     Recent Labs: No results found for requested labs within last 365 days.  Recent Lipid Panel    Component Value Date/Time   CHOL 217 (H) 09/17/2021 0913   TRIG 122 09/17/2021 0913   HDL 58 09/17/2021 0913   CHOLHDL 3.7 09/17/2021 0913   CHOLHDL 3.4 07/26/2015 0918   VLDL 28 07/26/2015 0918   LDLCALC  137 (H) 09/17/2021 0913   LDLDIRECT 114 (H) 03/24/2022 1143    Physical Exam:    VS:  BP 117/60   Pulse 62   Ht 5\' 5"  (1.651 m)   Wt 198 lb (89.8 kg)   SpO2 98%   BMI 32.95 kg/m     Wt Readings from Last 3 Encounters:  04/01/23 198 lb (89.8 kg)  01/20/23 200 lb (90.7 kg)  12/22/22 200 lb 9.6 oz (91 kg)     GEN:  Well nourished, well developed in no acute distress HEENT: Normal NECK: No JVD; No carotid bruits CARDIAC: RRR, no murmurs, rubs, gallops RESPIRATORY:  Clear to auscultation without rales, wheezing or rhonchi  ABDOMEN: Soft, non-tender, non-distended MUSCULOSKELETAL:  No edema; No deformity  SKIN: Warm and dry NEUROLOGIC:  Alert and oriented x 3 PSYCHIATRIC:  Normal affect   ASSESSMENT:    1. Palpitations   2. Anomalous right coronary artery   3. Shortness of breath   4. Essential hypertension   5. Hyperlipidemia, unspecified hyperlipidemia type      PLAN:    Chest pain/dyspnea: Atypical in description but does report can occur with exertion.  Coronary CTA on 08/02/2020 showed  anomalous RCA off the left coronary cusp with slitlike appearance at origin and traversing between the aorta and main pulmonary artery, calcium score 0, no hemodynamically significant lesions by CT FFR.   Exercise Myoview on 10/03/2020 showed fair exercise capacity (8.5 METS), hypertensive response to exercise (peak BP 210/85), no EKG evidence of ischemia, EF 70%, normal perfusion.  Echo 08/26/2022 showed EF 60 to 65%, normal RV function, no significant valvular disease.  Palpitations: Symptoms corresponded to PACs and Zio patch.  PACs represented 2.3% of beats on monitor.  Normal thyroid function.  TTE on 06/01/2019 showed normal systolic function.  Reported worsening palpitations and underwentZio patch x 5.5 days in 07/2022 showed 9 episodes of SVT, longest lasting 27 seconds with average rate 99 bpm.   -She reports worsening palpitations, will start Toprol-XL 25 mg daily  T2DM: A1c 6.9 12/22/22.  Check lipid panel and plan to start statin  Snoring: Sleep study unremarkable on 10/01/2020  Hyperlipidemia: LDL 176 on 08/08/2020.  Given 10-year ASCVD risk score 5% and calcium score 0 on 08/02/2020, does not meet indication for statin at this time.  Recommended lifestyle modifications.  LDL improved to 137 on 09/18/2021 -Given she is now in diabetes range on hemoglobin A1c, would recommend starting statin.  Will check lipid panel and plan to start statin  Hypertension: on amlodipine 5mg  daily.  Adding Toprol-XL 25 mg daily for palpitations as above  RTC in 6 months     Medication Adjustments/Labs and Tests Ordered: Current medicines are reviewed at length with the patient today.  Concerns regarding medicines are outlined above.  Orders Placed This Encounter  Procedures   Comprehensive Metabolic Panel (CMET)   Lipid panel   TSH   CBC w/Diff/Platelet   EKG 12-Lead    Meds ordered this encounter  Medications   metoprolol succinate (TOPROL XL) 25 MG 24 hr tablet    Sig: Take 1 tablet (25 mg total) by  mouth daily.    Dispense:  90 tablet    Refill:  3     Patient Instructions  Medication Instructions:  Start Toprol XL 25 mg daily Continue all current medications *If you need a refill on your cardiac medications before your next appointment, please call your pharmacy*   Lab Work: Cmet, mg, tsh, cbc today If  you have labs (blood work) drawn today and your tests are completely normal, you will receive your results only by: MyChart Message (if you have MyChart) OR A paper copy in the mail If you have any lab test that is abnormal or we need to change your treatment, we will call you to review the results.   Testing/Procedures: none   Follow-Up: At [facility], you and your health needs are our priority.  As part of our continuing mission to provide you with exceptional heart care, we have created designated Provider Care Teams.  These Care Teams include your primary Cardiologist (physician) and Advanced Practice Providers (APPs -  Physician Assistants and Nurse Practitioners) who all work together to provide you with the care you need, when you need it.  We recommend signing up for the patient portal called "MyChart".  Sign up information is provided on this After Visit Summary.  MyChart is used to connect with patients for Virtual Visits (Telemedicine).  Patients are able to view lab/test results, encounter notes, upcoming appointments, etc.  Non-urgent messages can be sent to your provider as well.   To learn more about what you can do with MyChart, go to [email or web address].    Your next appointment:   4 month(s)  Provider:   Little Ishikawa, MD     Other Instructions none            Signed, Little Ishikawa, MD  04/01/2023 9:27 AM    [facility]

## 2023-04-01 NOTE — Patient Instructions (Signed)
Medication Instructions:  Start Toprol XL 25 mg daily Continue all current medications *If you need a refill on your cardiac medications before your next appointment, please call your pharmacy*   Lab Work: Cmet, mg, tsh, cbc today If you have labs (blood work) drawn today and your tests are completely normal, you will receive your results only by: MyChart Message (if you have MyChart) OR A paper copy in the mail If you have any lab test that is abnormal or we need to change your treatment, we will call you to review the results.   Testing/Procedures: none   Follow-Up: At [facility], you and your health needs are our priority.  As part of our continuing mission to provide you with exceptional heart care, we have created designated Provider Care Teams.  These Care Teams include your primary Cardiologist (physician) and Advanced Practice Providers (APPs -  Physician Assistants and Nurse Practitioners) who all work together to provide you with the care you need, when you need it.  We recommend signing up for the patient portal called "MyChart".  Sign up information is provided on this After Visit Summary.  MyChart is used to connect with patients for Virtual Visits (Telemedicine).  Patients are able to view lab/test results, encounter notes, upcoming appointments, etc.  Non-urgent messages can be sent to your provider as well.   To learn more about what you can do with MyChart, go to [email or web address].    Your next appointment:   4 month(s)  Provider:   Little Ishikawa, MD     Other Instructions none

## 2023-04-02 LAB — LIPID PANEL
Chol/HDL Ratio: 3.8 {ratio} (ref 0.0–4.4)
Cholesterol, Total: 304 mg/dL — ABNORMAL HIGH (ref 100–199)
HDL: 80 mg/dL (ref >39)
LDL Chol Calc (NIH): 184 mg/dL — ABNORMAL HIGH (ref 0–99)
Triglycerides: 218 mg/dL — ABNORMAL HIGH (ref 0–149)
VLDL Cholesterol Cal: 40 mg/dL (ref 5–40)

## 2023-04-02 LAB — COMPREHENSIVE METABOLIC PANEL
ALT: 20 [IU]/L (ref 0–32)
AST: 19 [IU]/L (ref 0–40)
Albumin: 4.7 g/dL (ref 3.8–4.9)
Alkaline Phosphatase: 98 [IU]/L (ref 44–121)
BUN/Creatinine Ratio: 22 (ref 12–28)
BUN: 17 mg/dL (ref 8–27)
Bilirubin Total: 0.3 mg/dL (ref 0.0–1.2)
CO2: 18 mmol/L — ABNORMAL LOW (ref 20–29)
Calcium: 9.8 mg/dL (ref 8.7–10.3)
Chloride: 101 mmol/L (ref 96–106)
Creatinine, Ser: 0.78 mg/dL (ref 0.57–1.00)
Globulin, Total: 3.1 g/dL (ref 1.5–4.5)
Glucose: 133 mg/dL — ABNORMAL HIGH (ref 70–99)
Potassium: 4.3 mmol/L (ref 3.5–5.2)
Sodium: 138 mmol/L (ref 134–144)
Total Protein: 7.8 g/dL (ref 6.0–8.5)
eGFR: 87 mL/min/{1.73_m2} (ref >59)

## 2023-04-02 LAB — CBC WITH DIFFERENTIAL/PLATELET
Basophils Absolute: 0 10*3/uL (ref 0.0–0.2)
Basos: 0 %
EOS (ABSOLUTE): 0.1 10*3/uL (ref 0.0–0.4)
Eos: 3 %
Hematocrit: 42.3 % (ref 34.0–46.6)
Hemoglobin: 14.1 g/dL (ref 11.1–15.9)
Immature Grans (Abs): 0 10*3/uL (ref 0.0–0.1)
Immature Granulocytes: 0 %
Lymphocytes Absolute: 2.8 10*3/uL (ref 0.7–3.1)
Lymphs: 64 %
MCH: 27.7 pg (ref 26.6–33.0)
MCHC: 33.3 g/dL (ref 31.5–35.7)
MCV: 83 fL (ref 79–97)
Monocytes Absolute: 0.3 10*3/uL (ref 0.1–0.9)
Monocytes: 7 %
Neutrophils Absolute: 1.2 10*3/uL — ABNORMAL LOW (ref 1.4–7.0)
Neutrophils: 26 %
Platelets: 267 10*3/uL (ref 150–450)
RBC: 5.09 x10E6/uL (ref 3.77–5.28)
RDW: 12.9 % (ref 11.7–15.4)
WBC: 4.5 10*3/uL (ref 3.4–10.8)

## 2023-04-02 LAB — TSH: TSH: 2.28 u[IU]/mL (ref 0.450–4.500)

## 2023-04-03 ENCOUNTER — Telehealth: Payer: Self-pay | Admitting: *Deleted

## 2023-04-03 ENCOUNTER — Other Ambulatory Visit: Payer: Self-pay | Admitting: Family Medicine

## 2023-04-03 DIAGNOSIS — E785 Hyperlipidemia, unspecified: Secondary | ICD-10-CM

## 2023-04-03 DIAGNOSIS — R232 Flushing: Secondary | ICD-10-CM

## 2023-04-03 MED ORDER — ROSUVASTATIN CALCIUM 20 MG PO TABS: 20.0000 mg | ORAL_TABLET | Freq: Every day | ORAL | 3 refills | Status: DC

## 2023-04-03 NOTE — Telephone Encounter (Signed)
Called and made patient aware per Dr. Bjorn Pippin recommends start Rosuvastatin 20 mg daily and fasting Lipid Panel in 2 months. Patient made aware nothing to eat or drink after midnight, can have water only. Patient verbalized an understanding.

## 2023-04-06 ENCOUNTER — Ambulatory Visit: Payer: 59

## 2023-04-10 ENCOUNTER — Encounter: Payer: Self-pay | Admitting: Gastroenterology

## 2023-04-10 ENCOUNTER — Ambulatory Visit (INDEPENDENT_AMBULATORY_CARE_PROVIDER_SITE_OTHER): Payer: 59 | Admitting: Gastroenterology

## 2023-04-10 VITALS — BP 122/78 | HR 78 | Ht 65.0 in | Wt 198.0 lb

## 2023-04-10 DIAGNOSIS — K5010 Crohn's disease of large intestine without complications: Secondary | ICD-10-CM | POA: Diagnosis not present

## 2023-04-10 DIAGNOSIS — K5900 Constipation, unspecified: Secondary | ICD-10-CM | POA: Diagnosis not present

## 2023-04-10 DIAGNOSIS — K219 Gastro-esophageal reflux disease without esophagitis: Secondary | ICD-10-CM | POA: Diagnosis not present

## 2023-04-10 MED ORDER — MESALAMINE 1.2 G PO TBEC: 2.4000 g | DELAYED_RELEASE_TABLET | Freq: Every day | ORAL | 4 refills | Status: AC

## 2023-04-10 MED ORDER — OMEPRAZOLE 20 MG PO CPDR: 20.0000 mg | DELAYED_RELEASE_CAPSULE | Freq: Every day | ORAL | 3 refills | Status: DC

## 2023-04-10 NOTE — Patient Instructions (Signed)
 We have sent the following medications to your pharmacy for you to pick up at your convenience: Lialda . Omeprazole    Decrease your Omeprazole  to 20mg  once daily   You will be due for a recall colonoscopy in April or May . We will send you a reminder in the mail when it gets closer to that time.   _______________________________________________________  If your blood pressure at your visit was 140/90 or greater, please contact your primary care physician to follow up on this.  _______________________________________________________  If you are age 67 or older, your body mass index should be between 23-30. Your Body mass index is 32.95 kg/m. If this is out of the aforementioned range listed, please consider follow up with your Primary Care Provider.  If you are age 70 or younger, your body mass index should be between 19-25. Your Body mass index is 32.95 kg/m. If this is out of the aformentioned range listed, please consider follow up with your Primary Care Provider.   ________________________________________________________  The [facility] providers would like to encourage you to use MYCHART to communicate with providers for non-urgent requests or questions.  Due to long hold times on the telephone, sending your provider a message by [facility] may be a faster and more efficient way to get a response.  Please allow 48 business hours for a response.  Please remember that this is for non-urgent requests.  _______________________________________________________  Thank you for choosing me and [facility].  Dr. Wilhel[place]ia

## 2023-04-10 NOTE — Progress Notes (Signed)
 GASTROENTEROLOGY OUTPATIENT CLINIC VISIT   Primary Care Provider Stoney Blizzard, DO [address] [place] [address] [phone]   Patient Profile: Amanda Dickerson is a 63 y.o. female with a pmh significant for fibromyalgia, asthma, anxiety, arthritis, headaches, GERD, prior HP (s/p eradication) Crohn's colitis (dx in 2015 left-sided disease -on Lialda ), chronic constipation.  The patient presents to the [facility] for an evaluation and management of problem(s) noted below:  Problem List 1. Gastroesophageal reflux disease without esophagitis   2. Crohn's disease of colon without complication (HCC)   3. Constipation, unspecified constipation type    Discussed the use of AI scribe software for clinical note transcription with the patient, who gave verbal consent to proceed.  History of Present Illness Please see prior GI notes for full details of HPI.  Interval History The patient returns for follow-up.  She has been followed by Dr. Aneita for years in setting of underlying Crohn's colitis (>10 years of diagnosis - 2015) and her GERD symptoms.  She had been on Lialda  with good effect (no further bleeding) for many years.  When last seen in 2023, her Lialda  dosing was decreased to 2.4 g daily.  She experiences no diarrheal symptoms.  She actually has significant constipation and uses the restroom every 3-4 days.  Previously used laxative therapy but has stopped and just using oral/digestive fiber to help her move her bowels at this time.  She has not had any significant rectal bleeding or rectal pain.  She has a history of acid reflux, for which she takes omeprazole  40 mg and has had good effect on her symptoms.  She wonders if she needs to maintain this dosing further.  She mentions experiencing chest pain more frequently and is being worked up by her Cardiology team.  She is feeling well and would like her prescriptions updated.   GI Review of  Systems Positive as above Negative for odynophagia, nausea, vomiting, melena, hematochezia, changes in bowel habits  Review of Systems General: Denies fevers/chills/weight loss unintentionally HEENT: Denies oral lesions Cardiovascular: Denies current chest pain Pulmonary: Denies shortness of breath Gastroenterological: See HPI Genitourinary: Denies darkened urine Hematological: Denies easy bruising/bleeding Dermatological: Denies jaundice Psychological: Mood is stable   Medications Current Outpatient Medications  Medication Sig Dispense Refill   amLODipine  (NORVASC ) 5 MG tablet TAKE 1 TABLET (5 MG TOTAL) BY MOUTH DAILY. 90 tablet 1   cyclobenzaprine  (FLEXERIL ) 5 MG tablet Take 0.5-1 tablets (2.5-5 mg total) by mouth at bedtime. 30 tablet 1   metoprolol  succinate (TOPROL  XL) 25 MG 24 hr tablet Take 1 tablet (25 mg total) by mouth daily. 90 tablet 3   mometasone -formoterol  (DULERA ) 100-5 MCG/ACT AERO INHALE 2 PUFFS INTO THE LUNGS FOR WHEEZING OR SHORTNESS OF BREATH 13 each 3   omeprazole  (PRILOSEC) 20 MG capsule Take 1 capsule (20 mg total) by mouth daily. 30 capsule 3   rosuvastatin  (CRESTOR ) 20 MG tablet Take 1 tablet (20 mg total) by mouth daily. 90 tablet 3   venlafaxine  (EFFEXOR ) 75 MG tablet TAKE 1 TABLET BY MOUTH TWICE A DAY 180 tablet 1   mesalamine  (LIALDA ) 1.2 g EC tablet Take 2 tablets (2.4 g total) by mouth daily with breakfast. 180 tablet 4   No current facility-administered medications for this visit.    Allergies Allergies  Allergen Reactions   Ibuprofen  Other (See Comments)    Upsets stomach   Penicillins Nausea And Vomiting    Histories Past Medical History:  Diagnosis Date  Anemia    Anxiety    Arthritis    KNEES   Asthma    Colitis    Dysfunctional uterine bleeding    Fibromyalgia    GERD (gastroesophageal reflux disease)    Headache(784.0)    History of cardiovascular stress test    ETT-Myoview  7/16:  EF 71%, no ischemia or infarct; Low Risk    Past Surgical History:  Procedure Laterality Date   CESAREAN SECTION  1988   COLONOSCOPY     ENDOMETRIAL ABLATION W/ NOVASURE  08/2011   MULTIPLE TOOTH EXTRACTIONS     TUBAL LIGATION  2000   Social History   Socioeconomic History   Marital status: Divorced    Spouse name: Not on file   Number of children: 1   Years of education: 12   Highest education level: Not on file  Occupational History   Occupation: Disabled  Tobacco Use   Smoking status: Never    Passive exposure: Never   Smokeless tobacco: Never  Vaping Use   Vaping status: Never Used  Substance and Sexual Activity   Alcohol use: Yes    Comment: occasionally   Drug use: No   Sexual activity: Yes    Birth control/protection: Surgical  Other Topics Concern   Not on file  Social History Narrative   Lives alone   Caffeine - rarely   Social Drivers of Corporate Investment Banker Strain: Not on file  Food Insecurity: Not on file  Transportation Needs: Not on file  Physical Activity: Not on file  Stress: Not on file  Social Connections: Not on file  Intimate Partner Violence: Not on file   Family History  Problem Relation Age of Onset   Hypertension Mother    Heart disease Mother    Hyperlipidemia Mother    Other Mother        vertigo   Colon polyps Mother    Diabetes Father    Prostate cancer Father    Colon polyps Sister    Esophageal cancer Neg Hx    Colon cancer Neg Hx    Stomach cancer Neg Hx    Rectal cancer Neg Hx    Inflammatory bowel disease Neg Hx    Liver disease Neg Hx    Pancreatic cancer Neg Hx    I have reviewed her medical, social, and family history in detail and updated the electronic medical record as necessary.    PHYSICAL EXAMINATION  BP 122/78   Pulse 78   Ht 5' 5 (1.651 m)   Wt 198 lb (89.8 kg)   BMI 32.95 kg/m  Wt Readings from Last 3 Encounters:  04/10/23 198 lb (89.8 kg)  04/01/23 198 lb (89.8 kg)  01/20/23 200 lb (90.7 kg)  GEN: NAD, appears stated age,  doesn't appear chronically ill PSYCH: Cooperative, without pressured speech EYE: Conjunctivae pink, sclerae anicteric ENT: MMM CV: Nontachycardic RESP: No audible wheezing GI: NABS, soft, rounded, NT, without rebound MSK/EXT: No significant lower extremity edema SKIN: No jaundice NEURO:  Alert & Oriented x 3, no focal deficits   REVIEW OF DATA  I reviewed the following data at the time of this encounter:  GI Procedures and Studies  2020 colonoscopy - The examined portion of the ileum was normal. - Patchy mild inflammation was found in the rectum and in the sigmoid colon secondary to colitis. Biopsied. - Internal hemorrhoids. - The examination was otherwise normal on direct and retroflexion views  2020 EGD - Normal esophagus. -  Erosive gastropathy with no bleeding and no stigmata of recent bleeding. Biopsied. - Medium-sized hiatal hernia. - Normal duodenal bulb and second portion of the duodenum.  Pathology Diagnosis 1. Surgical [P], colon, sigmoid - CHRONIC MILDLY ACTIVE COLITIS WITH GRANULOMAS. - NO DYSPLASIA OR MALIGNANCY. 2. Surgical [P], colon, rectum - CHRONIC MILDLY ACTIVE COLITIS. - NO DYSPLASIA OR MALIGNANCY. 3. Surgical [P], gastric antrum and gastric body - CHRONIC ACTIVE GASTRITIS WITH HELICOBACTER PYLORI. - WARTHIN-STARRY IS POSITIVE FOR HELICOBACTER PYLORI. - NO INTESTINAL METAPLASIA, DYSPLASIA, OR MALIGNANCY.  Laboratory Studies  Reviewed those in epic  Imaging Studies  No new imaging studies to review   ASSESSMENT  Ms. Kendra is a 63 y.o. female with a pmh significant for fibromyalgia, asthma, anxiety, arthritis, headaches, GERD, prior HP (post eradication), Crohn's colitis (dx in 2015 left-sided disease -on Lialda ), chronic constipation.  The patient is seen today for evaluation and management of:  1. Gastroesophageal reflux disease without esophagitis   2. Crohn's disease of colon without complication (HCC)   3. Constipation, unspecified constipation  type    The patient is hemodynamically and clinically stable at this time.  She has been diagnosed with Crohn's colitis over 10 years ago and requires surveillance colonoscopy at this point in time.  If she has a normal-appearing colon, we can space out her colonoscopies from there.  Interestingly, patient has been dealing more with constipation issues rather than any diarrheal symptoms.  Will go ahead and just maintain her currently all the dosing at 2.4 g daily.  If she has evidence of active Crohn's will need to consider increasing back to 4.8 g daily and consider role of other therapies (what is hard is that the patient does not have evidence of more significant inflammation pattern based on her clinical symptomatology).  Could consider fecal calprotectin but for now as she is doing well we will plan to just update her colonoscopy and if there is inflammation that is found we will then proceed with a fecal calprotectin at that point.  She wants to continue to do her constipation therapies without laxative therapy if possible, so we can be available for this in the future if needed.  She will schedule colonoscopy later this year.  The risks and benefits of endoscopic evaluation were discussed with the patient; these include but are not limited to the risk of perforation, infection, bleeding, missed lesions, lack of diagnosis, severe illness requiring hospitalization, as well as anesthesia and sedation related illnesses.  The patient and/or family is agreeable to proceed.  Will see if we can decrease her PPI to 20 mg daily in an effort of trying to be on the lowest effective dose for her GERD symptoms.  If this does not work we will go back up to 40 mg once daily.  All patient questions were answered to the best of my ability, and the patient agrees to the aforementioned plan of action with follow-up as indicated.   PLAN  Decrease omeprazole  to 20 mg daily (take 30 minutes before breakfast or dinner) Lialda   2.4 g daily for now Proceed with surveillance colonoscopy this year Pending results will consider fecal calprotectin Holding on additional therapies for now Can consider other laxative therapies in future, if patient desires   No orders of the defined types were placed in this encounter.   New Prescriptions   OMEPRAZOLE  (PRILOSEC) 20 MG CAPSULE    Take 1 capsule (20 mg total) by mouth daily.   Modified Medications   Modified Medication  Previous Medication   MESALAMINE  (LIALDA ) 1.2 G EC TABLET mesalamine  (LIALDA ) 1.2 g EC tablet      Take 2 tablets (2.4 g total) by mouth daily with breakfast.    TAKE 2 TABLETS BY MOUTH DAILY WITH BREAKFAST.    Planned Follow Up No follow-ups on file.   Total Time in Face-to-Face and in Coordination of Care for patient including independent/personal interpretation/review of prior testing, medical history, examination, medication adjustment, communicating results with the patient directly, and documentation within the EHR is 30 minutes.   Aloha Finner, MD [facility] Office # [phone]

## 2023-04-27 ENCOUNTER — Ambulatory Visit (INDEPENDENT_AMBULATORY_CARE_PROVIDER_SITE_OTHER): Payer: 59

## 2023-04-27 VITALS — Ht 65.0 in | Wt 196.0 lb

## 2023-04-27 DIAGNOSIS — Z0000 Encounter for general adult medical examination without abnormal findings: Secondary | ICD-10-CM

## 2023-04-27 NOTE — Progress Notes (Signed)
 Subjective:   Amanda Dickerson is a 63 y.o. who presents for a Medicare Wellness preventive visit.  Visit Complete: Virtual I connected with  Amanda Dickerson on 04/27/23 by a audio enabled telemedicine application and verified that I am speaking with the correct person using two identifiers.  Patient Location: Home  Provider Location: Office/Clinic  I discussed the limitations of evaluation and management by telemedicine. The patient expressed understanding and agreed to proceed.  Vital Signs: Because this visit was a virtual/telehealth visit, some criteria may be missing or patient reported. Any vitals not documented were not able to be obtained and vitals that have been documented are patient reported.  VideoDeclined- This patient declined Librarian, academic. Therefore the visit was completed with audio only.  AWV Questionnaire: No: Patient Medicare AWV questionnaire was not completed prior to this visit.  Cardiac Risk Factors include: advanced age (>19men, >63 women);dyslipidemia;family history of premature cardiovascular disease;hypertension;obesity (BMI >30kg/m2);sedentary lifestyle     Objective:    Today's Vitals   04/27/23 1501  Weight: 196 lb (88.9 kg)  Height: 5\' 5"  (1.651 m)  PainSc: 4   PainLoc: Generalized   Body mass index is 32.62 kg/m.     04/27/2023    3:05 PM 12/22/2022    9:36 AM 05/09/2022    1:37 PM 03/24/2022   10:49 AM 10/07/2021    1:50 PM 08/27/2021    9:12 AM 08/06/2021    9:01 AM  Advanced Directives  Does Patient Have a Medical Advance Directive? No No No No No No No  Would patient like information on creating a medical advance directive? No - Patient declined No - Patient declined No - Patient declined No - Patient declined No - Patient declined No - Patient declined Yes (MAU/Ambulatory/Procedural Areas - Information given)    Current Medications (verified) Outpatient Encounter Medications as of 04/27/2023  Medication  Sig   amLODipine (NORVASC) 5 MG tablet TAKE 1 TABLET (5 MG TOTAL) BY MOUTH DAILY.   mesalamine (LIALDA) 1.2 g EC tablet Take 2 tablets (2.4 g total) by mouth daily with breakfast.   metoprolol succinate (TOPROL XL) 25 MG 24 hr tablet Take 1 tablet (25 mg total) by mouth daily.   mometasone-formoterol (DULERA) 100-5 MCG/ACT AERO INHALE 2 PUFFS INTO THE LUNGS FOR WHEEZING OR SHORTNESS OF BREATH   omeprazole (PRILOSEC) 20 MG capsule Take 1 capsule (20 mg total) by mouth daily.   rosuvastatin (CRESTOR) 20 MG tablet Take 1 tablet (20 mg total) by mouth daily.   venlafaxine (EFFEXOR) 75 MG tablet TAKE 1 TABLET BY MOUTH TWICE A DAY   cyclobenzaprine (FLEXERIL) 5 MG tablet Take 0.5-1 tablets (2.5-5 mg total) by mouth at bedtime. (Patient not taking: Reported on 04/27/2023)   No facility-administered encounter medications on file as of 04/27/2023.    Allergies (verified) Ibuprofen and Penicillins   History: Past Medical History:  Diagnosis Date   Anemia    Anxiety    Arthritis    KNEES   Asthma    Colitis    Dysfunctional uterine bleeding    Fibromyalgia    GERD (gastroesophageal reflux disease)    Headache(784.0)    History of cardiovascular stress test    ETT-Myoview 7/16:  EF 71%, no ischemia or infarct; Low Risk   Past Surgical History:  Procedure Laterality Date   CESAREAN SECTION  1988   COLONOSCOPY     ENDOMETRIAL ABLATION W/ NOVASURE  08/2011   MULTIPLE TOOTH EXTRACTIONS  TUBAL LIGATION  2000   Family History  Problem Relation Age of Onset   Hypertension Mother    Heart disease Mother    Hyperlipidemia Mother    Other Mother        vertigo   Colon polyps Mother    Diabetes Father    Prostate cancer Father    Colon polyps Sister    Esophageal cancer Neg Hx    Colon cancer Neg Hx    Stomach cancer Neg Hx    Rectal cancer Neg Hx    Inflammatory bowel disease Neg Hx    Liver disease Neg Hx    Pancreatic cancer Neg Hx    Social History   Socioeconomic History    Marital status: Divorced    Spouse name: Not on file   Number of children: 1   Years of education: 12   Highest education level: Not on file  Occupational History   Occupation: Disabled  Tobacco Use   Smoking status: Never    Passive exposure: Never   Smokeless tobacco: Never  Vaping Use   Vaping status: Never Used  Substance and Sexual Activity   Alcohol use: Yes    Comment: occasionally   Drug use: No   Sexual activity: Yes    Birth control/protection: Surgical  Other Topics Concern   Not on file  Social History Narrative   Lives alone   Caffeine - rarely   Social Drivers of Health   Financial Resource Strain: Low Risk  (04/27/2023)   Overall Financial Resource Strain (CARDIA)    Difficulty of Paying Living Expenses: Not hard at all  Food Insecurity: No Food Insecurity (04/27/2023)   Hunger Vital Sign    Worried About Running Out of Food in the Last Year: Never true    Ran Out of Food in the Last Year: Never true  Transportation Needs: No Transportation Needs (04/27/2023)   PRAPARE - Administrator, Civil Service (Medical): No    Lack of Transportation (Non-Medical): No  Physical Activity: Inactive (04/27/2023)   Exercise Vital Sign    Days of Exercise per Week: 0 days    Minutes of Exercise per Session: 0 min  Stress: No Stress Concern Present (04/27/2023)   [employer] of Occupational Health - Occupational Stress Questionnaire    Feeling of Stress : Not at all  Social Connections: Moderately Isolated (04/27/2023)   Social Connection and Isolation Panel [NHANES]    Frequency of Communication with Friends and Family: More than three times a week    Frequency of Social Gatherings with Friends and Family: More than three times a week    Attends Religious Services: More than 4 times per year    Active Member of [employer] or Organizations: No    Attends Banker Meetings: Never    Marital Status: Widowed    Tobacco Counseling Counseling given:  Not Answered    Clinical Intake:  Pre-visit preparation completed: Yes  Pain : No/denies pain Pain Score: 4      BMI - recorded: 32.62 Nutritional Status: BMI > 30  Obese Nutritional Risks: None Diabetes: No  How often do you need to have someone help you when you read instructions, pamphlets, or other written materials from your doctor or pharmacy?: 1 - Never What is the last grade level you completed in school?: HSG  Interpreter Needed?: No  Information entered by :: Leniyah Martell N. Jeimy Bickert, LPN.   Activities of Daily Living  04/27/2023    3:10 PM  In your present state of health, do you have any difficulty performing the following activities:  Hearing? 0  Vision? 0  Difficulty concentrating or making decisions? 0  Walking or climbing stairs? 0  Dressing or bathing? 0  Doing errands, shopping? 0  Preparing Food and eating ? N  Using the Toilet? N  In the past six months, have you accidently leaked urine? N  Do you have problems with loss of bowel control? N  Managing your Medications? N  Managing your Finances? N  Housekeeping or managing your Housekeeping? N    Patient Care Team: Para March, DO as PCP - General (Family Medicine) Little Ishikawa, MD as PCP - Cardiology (Cardiology) [facility] as Consulting Physician (Optometry)  Indicate any recent Medical Services you may have received from other than [facility] providers in the past year (date may be approximate).     Assessment:   This is a routine wellness examination for Amanda Dickerson.  Hearing/Vision screen Hearing Screening - Comments:: Denies hearing difficulties. No hearing aids.  Vision Screening - Comments:: Wears rx glasses - up to date with routine eye exams with Timothy McFarland, OD.    Goals Addressed             This Visit's Progress    Client understands the importance of follow-up with providers by attending scheduled visits          Depression Screen     04/27/2023    3:08 PM 12/22/2022    9:36 AM 05/09/2022    1:37 PM 03/24/2022   10:49 AM 10/07/2021    1:51 PM 08/27/2021    9:11 AM 08/06/2021    9:01 AM  PHQ 2/9 Scores  PHQ - 2 Score 0 0 0 1 0 0 0  PHQ- 9 Score 5 4 2 5  0 4 4    Fall Risk     04/27/2023    3:06 PM 03/24/2022   10:49 AM 07/19/2019   10:37 AM 01/07/2019    9:23 AM 10/20/2018    8:51 AM  Fall Risk   Falls in the past year? 0 0 0 0 0  Number falls in past yr: 0 0  0 0  Injury with Fall? 0 0     Risk for fall due to : No Fall Risks      Follow up Falls prevention discussed;Falls evaluation completed        MEDICARE RISK AT HOME:  Medicare Risk at Home Any stairs in or around the home?: Yes (front  and back entry) If so, are there any without handrails?: Yes Home free of loose throw rugs in walkways, pet beds, electrical cords, etc?: Yes Adequate lighting in your home to reduce risk of falls?: Yes Life alert?: No Use of a cane, walker or w/c?: No Grab bars in the bathroom?: Yes Shower chair or bench in shower?: No Elevated toilet seat or a handicapped toilet?: Yes  TIMED UP AND GO:  Was the test performed?  No  Cognitive Function: 6CIT completed    04/27/2023    3:08 PM  MMSE - Mini Mental State Exam  Not completed: Unable to complete        04/27/2023    3:08 PM  6CIT Screen  What Year? 0 points  What month? 0 points  What time? 0 points  Count back from 20 0 points  Months in reverse 0 points  Repeat phrase  0 points  Total Score 0 points    Immunizations Immunization History  Administered Date(s) Administered   Influenza,inj,Quad PF,6+ Mos 01/12/2015   PFIZER Comirnaty(Gray Top)Covid-19 Tri-Sucrose Vaccine 03/28/2020   PFIZER(Purple Top)SARS-COV-2 Vaccination 06/17/2019, 07/08/2019    Screening Tests Health Maintenance  Topic Date Due   Pneumococcal Vaccine 25-82 Years old (1 of 2 - PCV) Never done   Zoster Vaccines- Shingrix (1 of 2) Never done   COVID-19 Vaccine  (4 - 2024-25 season) 11/02/2022   INFLUENZA VACCINE  06/01/2023 (Originally 10/02/2022)   DTaP/Tdap/Td (1 - Tdap) 12/22/2023 (Originally [birth year]/18/1981)   Diabetic kidney evaluation - Urine ACR  12/22/2027 (Originally [birth year]/18/1980)   MAMMOGRAM  10/06/2023   Diabetic kidney evaluation - eGFR measurement  03/31/2024   Medicare Annual Wellness (AWV)  04/26/2024   Cervical Cancer Screening (HPV/Pap Cotest)  12/22/2027   Colonoscopy  02/16/2029   Hepatitis C Screening  Completed   HIV Screening  Completed   HPV VACCINES  Aged Out    Health Maintenance  Health Maintenance Due  Topic Date Due   Pneumococcal Vaccine 26-73 Years old (1 of 2 - PCV) Never done   Zoster Vaccines- Shingrix (1 of 2) Never done   COVID-19 Vaccine (4 - 2024-25 season) 11/02/2022   Health Maintenance Items Addressed: Patient declined the following vaccines: Covid-19, Pneumonia, Shingrix, Flu and DTaP.   Additional Screening:  Vision Screening: Recommended annual ophthalmology exams for early detection of glaucoma and other disorders of the eye.  Dental Screening: Recommended annual dental exams for proper oral hygiene  Community Resource Referral / Chronic Care Management: CRR required this visit?  No   CCM required this visit?  No     Plan:     I have personally reviewed and noted the following in the patient's chart:   Medical and social history Use of alcohol, tobacco or illicit drugs  Current medications and supplements including opioid prescriptions. Patient is not currently taking opioid prescriptions. Functional ability and status Nutritional status Physical activity Advanced directives List of other physicians Hospitalizations, surgeries, and ER visits in previous 12 months Vitals Screenings to include cognitive, depression, and falls Referrals and appointments  In addition, I have reviewed and discussed with patient certain preventive protocols, quality metrics, and best practice  recommendations. A written personalized care plan for preventive services as well as general preventive health recommendations were provided to patient.     Mickeal Needy, LPN   1/61/0960   After Visit Summary: (Declined) Due to this being a telephonic visit, with patients personalized plan was offered to patient but patient Declined AVS at this time   Notes: Please refer to Routing Comments.

## 2023-04-27 NOTE — Patient Instructions (Addendum)
 Amanda Dickerson , Thank you for taking time to come for your Medicare Wellness Visit. I appreciate your ongoing commitment to your health goals. Please review the following plan we discussed and let me know if I can assist you in the future.   Referrals/Orders/Follow-Ups/Clinician Recommendations: Yes; Keep your appointments with Dr. Rexene Alberts  nd any specialists that you may see.  Call us if you need anything.  Have a great year!!!!  This is a list of the screening recommended for you and due dates:  Health Maintenance  Topic Date Due   Pneumococcal Vaccination (1 of 2 - PCV) Never done   Zoster (Shingles) Vaccine (1 of 2) Never done   COVID-19 Vaccine (4 - 2024-25 season) 11/02/2022   Flu Shot  06/01/2023*   DTaP/Tdap/Td vaccine (1 - Tdap) 12/22/2023*   Yearly kidney health urinalysis for diabetes  12/22/2027*   Mammogram  10/06/2023   Yearly kidney function blood test for diabetes  03/31/2024   Medicare Annual Wellness Visit  04/26/2024   Pap with HPV screening  12/22/2027   Colon Cancer Screening  02/16/2029   Hepatitis C Screening  Completed   HIV Screening  Completed   HPV Vaccine  Aged Out  *Topic was postponed. The date shown is not the original due date.    Advanced directives: (Declined) Advance directive discussed with you today. Even though you declined this today, please call our office should you change your mind, and we can give you the proper paperwork for you to fill out.  Next Medicare Annual Wellness Visit scheduled for next year: Yes

## 2023-05-25 ENCOUNTER — Other Ambulatory Visit: Payer: Self-pay

## 2023-05-25 DIAGNOSIS — E785 Hyperlipidemia, unspecified: Secondary | ICD-10-CM

## 2023-05-25 LAB — LIPID PANEL
Chol/HDL Ratio: 1.9 ratio (ref 0.0–4.4)
Cholesterol, Total: 128 mg/dL (ref 100–199)
HDL: 68 mg/dL (ref >39)
LDL Chol Calc (NIH): 44 mg/dL (ref 0–99)
Triglycerides: 85 mg/dL (ref 0–149)
VLDL Cholesterol Cal: 16 mg/dL (ref 5–40)

## 2023-06-07 NOTE — Progress Notes (Unsigned)
    SUBJECTIVE:   CHIEF COMPLAINT / HPI:   Presents for A1c check Last 2019 was 6.9 12/2022 Has been working on exercising and has lost weight  Also has a skin lesion on her right forearm, unclear how long it has been there, it is not itchy or painful  Would also like her estrogen cream refilled.  States she used it in the past due to vaginal dryness and atrophy, stopped using it for a little bit because she was no longer sexually active.  PERTINENT  PMH / PSH: asthma, GERD, crohn's disease, HLD, prediabetes, MDD, GAD  OBJECTIVE:   BP 129/68   Pulse 66   Ht 5\' 5"  (1.651 m)   Wt 192 lb 3.2 oz (87.2 kg)   SpO2 100%   BMI 31.98 kg/m   Gen: well appearing, NAD Skin: Small erythematous slightly raised lesion to R forearm.     ASSESSMENT/PLAN:   Assessment & Plan Diabetes mellitus without complication (HCC) Meets criteria for DM-A1c today 6.8, it was 6.9 in October 2024.  Patient would like to try intensive lifestyle modifications over the next 3 months including dietary changes and continuing exercise prior to starting any medications for diabetes.  Discussed nutrition with her today - Patient will schedule appointment in 3 months to recheck A1c Skin lesion Skin lesion noted to right forearm, asymptomatic.  Unclear duration.  Discussed punch biopsy to diagnose it with patient, she would like to wait until her next appointment to do this Vaginal atrophy Patient would like her estrogen cream refilled that she uses intermittently for vaginal dryness - Premarin intravaginal cream refilled - Patient advised of risks of using topical estrogen cream including ovarian and breast cancer Gastroesophageal reflux disease, unspecified whether esophagitis present GI attempting to cut patient's PPI dose down, she decreased from 40 mg to 20 mg.  Patient states that she does not feel like she needs the omeprazole anymore, will start to work on coming off of this - Sent Pepcid for patient to take  when she experiences heartburn   Para March, DO [facility]

## 2023-06-08 ENCOUNTER — Encounter: Payer: Self-pay | Admitting: Family Medicine

## 2023-06-08 ENCOUNTER — Ambulatory Visit (INDEPENDENT_AMBULATORY_CARE_PROVIDER_SITE_OTHER): Admitting: Family Medicine

## 2023-06-08 VITALS — BP 129/68 | HR 66 | Ht 65.0 in | Wt 192.2 lb

## 2023-06-08 DIAGNOSIS — L989 Disorder of the skin and subcutaneous tissue, unspecified: Secondary | ICD-10-CM

## 2023-06-08 DIAGNOSIS — K219 Gastro-esophageal reflux disease without esophagitis: Secondary | ICD-10-CM

## 2023-06-08 DIAGNOSIS — R7303 Prediabetes: Secondary | ICD-10-CM | POA: Diagnosis not present

## 2023-06-08 DIAGNOSIS — N952 Postmenopausal atrophic vaginitis: Secondary | ICD-10-CM

## 2023-06-08 DIAGNOSIS — E119 Type 2 diabetes mellitus without complications: Secondary | ICD-10-CM

## 2023-06-08 LAB — POCT GLYCOSYLATED HEMOGLOBIN (HGB A1C): HbA1c, POC (prediabetic range): 6.8 % — AB (ref 5.7–6.4)

## 2023-06-08 MED ORDER — PREMARIN 0.625 MG/GM VA CREA: 1.0000 | TOPICAL_CREAM | VAGINAL | 3 refills | Status: AC

## 2023-06-08 MED ORDER — FAMOTIDINE 20 MG PO TABS: 20.0000 mg | ORAL_TABLET | Freq: Every day | ORAL | 0 refills | Status: DC | PRN

## 2023-06-08 NOTE — Assessment & Plan Note (Addendum)
 GI attempting to cut patient's PPI dose down, she decreased from 40 mg to 20 mg.  Patient states that she does not feel like she needs the omeprazole anymore, will start to work on coming off of this - Sent Pepcid for patient to take when she experiences heartburn

## 2023-06-08 NOTE — Assessment & Plan Note (Signed)
 Meets criteria for DM-A1c today 6.8, it was 6.9 in October 2024.  Patient would like to try intensive lifestyle modifications over the next 3 months including dietary changes and continuing exercise prior to starting any medications for diabetes.  Discussed nutrition with her today - Patient will schedule appointment in 3 months to recheck A1c

## 2023-06-08 NOTE — Patient Instructions (Addendum)
 It was great to see you today! Thank you for choosing [facility] for your primary care.   Today we addressed: You have diabetes.  Please continue to work on lifestyle modifications for the next 3 months and then we will recheck your A1c.  I have attached some paperwork about nutrition, be mindful of your carbohydrate and added sugars. You can consider scheduling for a punch biopsy in our dermatology clinic or with me for the lesion on your arm. I have refilled your estrogen cream.  The dosing will be 1 applicatorful once weekly   If you haven't already, sign up for My Chart to have easy access to your labs results, and communication with your primary care physician.  Call the clinic at [phone] if your symptoms worsen or you have any concerns.  You should return to our clinic Return in about 3 months (around 09/07/2023) for A1c. Please arrive 15 minutes before your appointment to ensure smooth check in process.  We appreciate your efforts in making this happen.  Thank you for allowing me to participate in your care, Para March, DO 06/08/2023, 10:53 AM PGY-1, [facility]

## 2023-06-08 NOTE — Progress Notes (Signed)
 A1c 6.8 today, discussed at office appointment

## 2023-07-30 NOTE — Progress Notes (Unsigned)
 Cardiology Office Note:    Date:  07/31/2023   ID:  Amanda Dickerson, DOB Jul 18, 1960, MRN [id number]  PCP:  Sarahann Cumins, DO  Cardiologist:  Wendie Hamburg, MD  Electrophysiologist:  None   Referring MD: Sarahann Cumins, DO   Chief Complaint  Patient presents with   Palpitations    History of Present Illness:    Amanda Dickerson is a 63 y.o. 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  She was referred by Dr. Lanetta Pion for evaluation of irregular heartbeat, initially seen on 06/15/2019.    Exercise Myoview  09/2014 showed no ischemia or infarct, EF 71%.  TTE 05/31/2019 showed normal biventricular function without evidence of significant valvular disease.  Labs on 05/27/2019 showed normal potassium, normal TSH.  Zio patch x3 days showed occasional PACs (2.3% of beats), with symptoms appearing to correspond to PACs.  Coronary CTA on 08/02/2020 showed anomalous RCA off the left coronary cusp with slitlike appearance at origin and traversing between the aorta and main pulmonary artery, calcium  score 0, no hemodynamically significant lesions by CT FFR.  Exercise Myoview  on 10/03/2020 showed fair exercise capacity (8.5 METS), hypertensive response to exercise (peak BP 210/85), no EKG evidence of ischemia, EF 70%, normal perfusion.  Zio patch x 5.5 days in 07/2022 showed 9 episodes of SVT, longest lasting 27 seconds with average rate 99 bpm.  Echo 08/26/2022 showed EF 60 to 65%, normal RV function, no significant valvular disease.  Since last clinic visit, she reports she is doing well.  Reports occasional sharp chest pain, just lasts for few seconds.  She denies any shortness of breath.  Reports her palpitations have improved since starting metoprolol .  She continues to walk for exercise.  Past Medical History:  Diagnosis Date   Anemia    Anxiety    Arthritis     KNEES   Asthma    Colitis    Dysfunctional uterine bleeding    Fibromyalgia    GERD (gastroesophageal reflux disease)    Headache(784.0)    History of cardiovascular stress test    ETT-Myoview  7/16:  EF 71%, no ischemia or infarct; Low Risk    Past Surgical History:  Procedure Laterality Date   CESAREAN SECTION  1988   COLONOSCOPY     ENDOMETRIAL ABLATION W/ NOVASURE  08/2011   MULTIPLE TOOTH EXTRACTIONS     TUBAL LIGATION  2000    Current Medications: Current Meds  Medication Sig   amLODipine  (NORVASC ) 5 MG tablet TAKE 1 TABLET (5 MG TOTAL) BY MOUTH DAILY.   conjugated estrogens  (PREMARIN ) vaginal cream Place 1 Applicatorful vaginally once a week.   cyclobenzaprine  (FLEXERIL ) 5 MG tablet Take 0.5-1 tablets (2.5-5 mg total) by mouth at bedtime.   famotidine  (PEPCID ) 20 MG tablet Take 1 tablet (20 mg total) by mouth daily as needed for heartburn or indigestion.   mesalamine  (LIALDA ) 1.2 g EC tablet Take 2 tablets (2.4 g total) by mouth daily with breakfast.   metoprolol  succinate (TOPROL  XL) 25 MG 24 hr tablet Take 1 tablet (25 mg total) by mouth daily.   mometasone -formoterol  (DULERA ) 100-5 MCG/ACT AERO INHALE 2 PUFFS INTO THE LUNGS FOR WHEEZING OR SHORTNESS OF BREATH   omeprazole  (PRILOSEC) 20 MG capsule Take 1 capsule (20 mg total) by mouth daily.   venlafaxine  (EFFEXOR ) 75 MG tablet TAKE 1 TABLET BY MOUTH TWICE A DAY     Allergies:   Ibuprofen  and  Penicillins   Social History   Socioeconomic History   Marital status: Divorced    Spouse name: Not on file   Number of children: 1   Years of education: 12   Highest education level: Not on file  Occupational History   Occupation: Disabled  Tobacco Use   Smoking status: Never    Passive exposure: Never   Smokeless tobacco: Never  Vaping Use   Vaping status: Never Used  Substance and Sexual Activity   Alcohol use: Yes    Comment: occasionally   Drug use: No   Sexual activity: Yes    Birth control/protection:  Surgical  Other Topics Concern   Not on file  Social History Narrative   Lives alone   Caffeine - rarely   Social Drivers of Health   Financial Resource Strain: Low Risk  (04/27/2023)   Overall Financial Resource Strain (CARDIA)    Difficulty of Paying Living Expenses: Not hard at all  Food Insecurity: No Food Insecurity (04/27/2023)   Hunger Vital Sign    Worried About Running Out of Food in the Last Year: Never true    Ran Out of Food in the Last Year: Never true  Transportation Needs: No Transportation Needs (04/27/2023)   PRAPARE - Administrator, Civil Service (Medical): No    Lack of Transportation (Non-Medical): No  Physical Activity: Inactive (04/27/2023)   Exercise Vital Sign    Days of Exercise per Week: 0 days    Minutes of Exercise per Session: 0 min  Stress: No Stress Concern Present (04/27/2023)   [employer] of Occupational Health - Occupational Stress Questionnaire    Feeling of Stress : Not at all  Social Connections: Moderately Isolated (04/27/2023)   Social Connection and Isolation Panel [NHANES]    Frequency of Communication with Friends and Family: More than three times a week    Frequency of Social Gatherings with Friends and Family: More than three times a week    Attends Religious Services: More than 4 times per year    Active Member of [employer] or Organizations: No    Attends Banker Meetings: Never    Marital Status: Widowed     Family History: The patient's family history includes Colon polyps in her mother and sister; Diabetes in her father; Heart disease in her mother; Hyperlipidemia in her mother; Hypertension in her mother; Other in her mother; Prostate cancer in her father. There is no history of Esophageal cancer, Colon cancer, Stomach cancer, Rectal cancer, Inflammatory bowel disease, Liver disease, or Pancreatic cancer.  ROS:   Please see the history of present illness.     All other systems reviewed and are  negative.  EKGs/Labs/Other Studies Reviewed:    The following studies were reviewed today:   EKG:   04/01/23: NSR, rate 62, no ST abnormalities 03/05/21:sinus rhythm, rate 59, no ST abnormalities 07/22: NSR, rate 69, no ST abnormalities 08/06/2020: NSR, motion artifact, rate 65, no ST abnormalities 06/15/2019: normal sinus rhythm, rate 56, no ST/T abnormalities  3 day Zio Monitor 07/04/2019: Occasional PACs (2.3% of beats). Patient triggered events appear to correspond to PACs 3 days of data recorded on Zio monitor. Patient had a min HR of 46 bpm, max HR of 154 bpm, and avg HR of 67 bpm. Predominant underlying rhythm was Sinus Rhythm. No VT,  atrial fibrillation, high degree block, or pauses noted. Three runs of SVT, longest lasting 11 beats.  Isolated ventricular ectopy was rare (<1%).  Isolated  atrail ectopy was occasional (2.3%). There were 24 triggered events, corresponding to sinus rhythm +/- PACs.   No significant arrhythmias detected.   TTE 05/31/19:  1. Left ventricular ejection fraction, by estimation, is 65 to 70%. The  left ventricle has normal function. The left ventricle has no regional  wall motion abnormalities. Left ventricular diastolic parameters were  normal.   2. Right ventricular systolic function is normal. The right ventricular  size is normal. There is mildly elevated pulmonary artery systolic  pressure.   3. The mitral valve is normal in structure. Trivial mitral valve  regurgitation. No evidence of mitral stenosis.   4. The aortic valve is tricuspid. Aortic valve regurgitation is not  visualized. No aortic stenosis is present.   Conclusion(s)/Recommendation(s): Normal biventricular function without  evidence of hemodynamically significant valvular heart disease.   Lexiscan Myoview  09/21/2014: The left ventricular ejection fraction is hyperdynamic (>65%). Nuclear stress EF: 71%. There was no ST segment deviation noted during stress. The study is normal. This is  a low risk study.   Normal study with no evidence of infarct or ischemia.     Recent Labs: 04/01/2023: ALT 20; BUN 17; Creatinine, Ser 0.78; Hemoglobin 14.1; Platelets 267; Potassium 4.3; Sodium 138; TSH 2.280  Recent Lipid Panel    Component Value Date/Time   CHOL 128 05/25/2023 1015   TRIG 85 05/25/2023 1015   HDL 68 05/25/2023 1015   CHOLHDL 1.9 05/25/2023 1015   CHOLHDL 3.4 07/26/2015 0918   VLDL 28 07/26/2015 0918   LDLCALC 44 05/25/2023 1015   LDLDIRECT 114 (H) 03/24/2022 1143    Physical Exam:    VS:  BP 126/74 (BP Location: Left Arm, Patient Position: Sitting)   Pulse 62   Ht 5\' 4"  (1.626 m)   Wt 183 lb 3.2 oz (83.1 kg)   SpO2 98%   BMI 31.45 kg/m     Wt Readings from Last 3 Encounters:  07/31/23 183 lb 3.2 oz (83.1 kg)  06/08/23 192 lb 3.2 oz (87.2 kg)  04/27/23 196 lb (88.9 kg)     GEN:  Well nourished, well developed in no acute distress HEENT: Normal NECK: No JVD; No carotid bruits CARDIAC: RRR, no murmurs, rubs, gallops RESPIRATORY:  Clear to auscultation without rales, wheezing or rhonchi  ABDOMEN: Soft, non-tender, non-distended MUSCULOSKELETAL:  No edema; No deformity  SKIN: Warm and dry NEUROLOGIC:  Alert and oriented x 3 PSYCHIATRIC:  Normal affect   ASSESSMENT:    1. Anomalous right coronary artery   2. Palpitations   3. Hyperlipidemia, unspecified hyperlipidemia type   4. Essential hypertension       PLAN:    Chest pain/dyspnea: Atypical in description but does report can occur with exertion.  Coronary CTA on 08/02/2020 showed anomalous RCA off the left coronary cusp with slitlike appearance at origin and traversing between the aorta and main pulmonary artery, calcium  score 0, no hemodynamically significant lesions by CT FFR.   Exercise Myoview  on 10/03/2020 showed fair exercise capacity (8.5 METS), hypertensive response to exercise (peak BP 210/85), no EKG evidence of ischemia, EF 70%, normal perfusion.  Echo 08/26/2022 showed EF 60 to 65%,  normal RV function, no significant valvular disease.  Palpitations: Symptoms corresponded to PACs on Zio patch.  PACs represented 2.3% of beats on monitor.  Normal thyroid  function.  TTE on 06/01/2019 showed normal systolic function.  Reported worsening palpitations and underwent Zio patch x 5.5 days in 07/2022 showed 9 episodes of SVT, longest lasting 27 seconds with  average rate 99 bpm.   - She reports improvement in palpitations since starting metoprolol , continue Toprol -XL 25 mg daily  T2DM: A1c 6.8% on 06/08/2023  Snoring: Sleep study unremarkable on 10/01/2020  Hyperlipidemia: LDL 184 on 04/01/2023.  Started rosuvastatin  20 mg daily, LDL 44 on 05/25/2023  Hypertension: on amlodipine  5mg  daily and Toprol -XL 25 mg daily.  Appears controlled   RTC in 1 year     Medication Adjustments/Labs and Tests Ordered: Current medicines are reviewed at length with the patient today.  Concerns regarding medicines are outlined above.  No orders of the defined types were placed in this encounter.   No orders of the defined types were placed in this encounter.    Patient Instructions  Medication Instructions:  None *If you need a refill on your cardiac medications before your next appointment, please call your pharmacy*  Lab Work: None If you have labs (blood work) drawn today and your tests are completely normal, you will receive your results only by: MyChart Message (if you have MyChart) OR A paper copy in the mail If you have any lab test that is abnormal or we need to change your treatment, we will call you to review the results.  Testing/Procedures: None  Follow-Up: At [facility], you and your health needs are our priority.  As part of our continuing mission to provide you with exceptional heart care, our providers are all part of one team.  This team includes your primary Cardiologist (physician) and Advanced Practice Providers or APPs (Physician Assistants and Nurse  Practitioners) who all work together to provide you with the care you need, when you need it.  Your next appointment:   1 year(s)  Provider:   Wendie Hamburg, MD          Signed, Wendie Hamburg, MD  07/31/2023 9:01 AM    [facility]

## 2023-07-31 ENCOUNTER — Ambulatory Visit: Payer: 59 | Attending: Cardiology | Admitting: Cardiology

## 2023-07-31 ENCOUNTER — Encounter: Payer: Self-pay | Admitting: Cardiology

## 2023-07-31 VITALS — BP 126/74 | HR 62 | Ht 64.0 in | Wt 183.2 lb

## 2023-07-31 DIAGNOSIS — I10 Essential (primary) hypertension: Secondary | ICD-10-CM | POA: Diagnosis not present

## 2023-07-31 DIAGNOSIS — E785 Hyperlipidemia, unspecified: Secondary | ICD-10-CM

## 2023-07-31 DIAGNOSIS — R002 Palpitations: Secondary | ICD-10-CM

## 2023-07-31 DIAGNOSIS — Q245 Malformation of coronary vessels: Secondary | ICD-10-CM | POA: Diagnosis not present

## 2023-07-31 NOTE — Patient Instructions (Signed)
 Medication Instructions:  None *If you need a refill on your cardiac medications before your next appointment, please call your pharmacy*  Lab Work: None If you have labs (blood work) drawn today and your tests are completely normal, you will receive your results only by: MyChart Message (if you have MyChart) OR A paper copy in the mail If you have any lab test that is abnormal or we need to change your treatment, we will call you to review the results.  Testing/Procedures: None  Follow-Up: At [facility], you and your health needs are our priority.  As part of our continuing mission to provide you with exceptional heart care, our providers are all part of one team.  This team includes your primary Cardiologist (physician) and Advanced Practice Providers or APPs (Physician Assistants and Nurse Practitioners) who all work together to provide you with the care you need, when you need it.  Your next appointment:   1 year(s)  Provider:   Wendie Hamburg, MD

## 2023-09-08 ENCOUNTER — Other Ambulatory Visit: Payer: Self-pay | Admitting: Cardiology

## 2023-09-09 ENCOUNTER — Telehealth: Payer: Self-pay | Admitting: Cardiology

## 2023-09-09 MED ORDER — AMLODIPINE BESYLATE 5 MG PO TABS: 5.0000 mg | ORAL_TABLET | Freq: Every day | ORAL | 3 refills | Status: AC

## 2023-09-09 NOTE — Telephone Encounter (Signed)
 Pt's medication was sent to pt's pharmacy as requested. Confirmation received.

## 2023-09-09 NOTE — Telephone Encounter (Signed)
*  STAT* If patient is at the pharmacy, call can be transferred to refill team.   1. Which medications need to be refilled? (please list name of each medication and dose if known) amLODipine  (NORVASC ) 5 MG tablet   2. Which pharmacy/location (including street and city if local pharmacy) is medication to be sent to?  [id number] - [place],  - [address]      3. Do they need a 30 day or 90 day supply? 90 day    Pt is out of medication

## 2023-09-21 ENCOUNTER — Other Ambulatory Visit: Payer: Self-pay | Admitting: Gastroenterology

## 2023-10-07 ENCOUNTER — Encounter: Payer: Self-pay | Admitting: Gastroenterology

## 2023-10-19 ENCOUNTER — Ambulatory Visit: Admitting: Family Medicine

## 2023-10-19 ENCOUNTER — Encounter: Payer: Self-pay | Admitting: Family Medicine

## 2023-10-19 VITALS — BP 135/72 | HR 56 | Ht 64.5 in | Wt 177.0 lb

## 2023-10-19 DIAGNOSIS — E119 Type 2 diabetes mellitus without complications: Secondary | ICD-10-CM

## 2023-10-19 DIAGNOSIS — Z135 Encounter for screening for eye and ear disorders: Secondary | ICD-10-CM

## 2023-10-19 DIAGNOSIS — Z1231 Encounter for screening mammogram for malignant neoplasm of breast: Secondary | ICD-10-CM

## 2023-10-19 DIAGNOSIS — R519 Headache, unspecified: Secondary | ICD-10-CM | POA: Diagnosis not present

## 2023-10-19 LAB — POCT GLYCOSYLATED HEMOGLOBIN (HGB A1C): HbA1c, POC (controlled diabetic range): 6.6 % (ref 0.0–7.0)

## 2023-10-19 MED ORDER — ATORVASTATIN CALCIUM 40 MG PO TABS: 40.0000 mg | ORAL_TABLET | Freq: Every day | ORAL | 3 refills | Status: AC

## 2023-10-19 NOTE — Progress Notes (Signed)
    SUBJECTIVE:   CHIEF COMPLAINT / HPI:   A1C check 6.8 06/2023, 6.9 12/2022 Decided at last visit to work on lifestyle changes A1C today 6.6  Needs mammogram, eye exam  Frequent Headache started a while ago, worse since starting statin, intermittent light-headedness Pain is throbbing, left sided Medications tried: none Patient thinks cause of headache might be: starting statin  Head trauma: no Sudden onset: no Previous similar headaches: no, previous migraines with severe photophobia but felt different than these Taking blood thinners: no Symptoms Nose congestion stuffiness:  no Nausea vomiting: some nausea Photophobia: yes, outdoor sunlight Noise sensitivity: no Double vision or loss of vision: no Fever: no Neck Stiffness: no Trouble walking or speaking: no   PERTINENT  PMH / PSH: mild intermittent asthma, GERD, proctosigmoiditis, Crohn's, DM, HLD, fibromyalgia, GAD  OBJECTIVE:   BP 135/72   Pulse (!) 56   Ht 5' 4.5 (1.638 m)   Wt 177 lb (80.3 kg)   SpO2 100%   BMI 29.91 kg/m   General: well appearing, NAD Cardiovascular: RRR, no m/r/g Respiratory: normal work of breathing on RA, CTAB Extremities: No swelling BLE Neuro: No vision changes. EOMI. Sensation intact BL. Facial expressions symmetric. Hearing intact BL, palate symmetric, regular speech, turns head against resistance, tongue midline. 5/5 strength BUE and BLE.   ASSESSMENT/PLAN:   Assessment & Plan Diabetes mellitus without complication (HCC) A1c improved to 6.6 today.  Patient would like to continue working on lifestyle modifications and dietary changes instead of starting metformin . Will recheck in 3 months Nonintractable headache, unspecified chronicity pattern, unspecified headache type More frequent headaches recently, they sound like migraines given the focality and associated photophobia and nausea.  Patient attributes the increase in frequency to starting Crestor .  No red flag  symptoms Will switch from Crestor  to atorvastatin  to see if she has less side effects, she has also been experiencing muscle cramps If headaches persist, consider migraine abortive therapy vs further workup Discussed red flag symptoms including focal weakness, vision changes, neurological deficits, thunderclap headache and when to go to the ED Encounter for screening mammogram for malignant neoplasm of breast Order for mammogram placed, patient provided with number to call to schedule Screening for diabetic retinopathy Ophthalmology referral placed for eye exam     Amanda Prescott, DO [facility]

## 2023-10-19 NOTE — Assessment & Plan Note (Signed)
 A1c improved to 6.6 today.  Patient would like to continue working on lifestyle modifications and dietary changes instead of starting metformin . Will recheck in 3 months

## 2023-10-19 NOTE — Patient Instructions (Addendum)
 Good to see you today - Thank you for coming in  Things we discussed today: I have switched you from Crestor  to atorvastatin  in hopes that you will have less side effects I have ordered your mammogram, please call to schedule this I have referred you to ophthalmology to get your diabetic eye exam  Great job on your weight loss and lowering your A1c.  We will recheck it in 3 months.  If you develop any symptoms with your headaches that we talked about such as sudden weakness on one side or the other, the worst headache of your life, loss of vision, passing out please go to the emergency room   Come back to see me in 3 months

## 2023-11-11 ENCOUNTER — Ambulatory Visit
Admission: RE | Admit: 2023-11-11 | Discharge: 2023-11-11 | Disposition: A | Discharge status: Home / Self Care | Source: Ambulatory Visit | Attending: Family Medicine

## 2023-11-11 ENCOUNTER — Encounter: Payer: Self-pay | Admitting: Radiology

## 2023-11-11 DIAGNOSIS — Z1231 Encounter for screening mammogram for malignant neoplasm of breast: Secondary | ICD-10-CM

## 2023-11-12 ENCOUNTER — Other Ambulatory Visit: Payer: Self-pay | Admitting: Family Medicine

## 2023-11-12 DIAGNOSIS — R232 Flushing: Secondary | ICD-10-CM

## 2023-11-12 DIAGNOSIS — J4520 Mild intermittent asthma, uncomplicated: Secondary | ICD-10-CM

## 2023-11-24 ENCOUNTER — Ambulatory Visit (AMBULATORY_SURGERY_CENTER): Admitting: *Deleted

## 2023-11-24 VITALS — Ht 64.5 in | Wt 177.0 lb

## 2023-11-24 DIAGNOSIS — Z1211 Encounter for screening for malignant neoplasm of colon: Secondary | ICD-10-CM

## 2023-11-24 MED ORDER — NA SULFATE-K SULFATE-MG SULF 17.5-3.13-1.6 GM/177ML PO SOLN: 1.0000 | Freq: Once | ORAL | 0 refills | Status: AC

## 2023-11-24 NOTE — Progress Notes (Signed)
 Pt's name and DOB verified at the beginning of the pre-visit with 2 identifiers  Pt denies any difficulty with ambulating,sitting, laying down or rolling side to side  Pt has no issues moving head neck or swallowing  No egg or soy allergy known to patient   No issues known to pt with past sedation  No FH of Malignant Hyperthermia  Pt is not on home 02   Pt is not on blood thinners   Pt has frequent issues with constipation RN instructed pt to use Miralax  per bottles instructions a week before prep days. Pt states they will  Pt is not on dialysis  Pt states she was told she had a fast HR  Pt denies any upcoming cardiac testing  Patient's chart reviewed by Norleen Schillings CNRA prior to pre-visit and patient appropriate for the [facility].  Pre-visit completed and red dot placed by patient's name on their procedure day (on provider's schedule).    Visit by phone  Pt states weight is 177 lb  Pt given  both [facility] main # and MD on call # prior to instructions.  Informed pt to come in at the time discussed and is shown on PV instructions.  Pt instructed to use Singlecare.com or GoodRx for a price reduction on prep  Instructed pt where to find PV instructions in My Chart  Instructed pt on all aspects of written instructions including med holds clothing to wear and foods to eat and not eat as well as after procedure legal restrictions and to call MD on call if needed.. Pt states understanding. Instructed pt to review instructions again prior to procedure and call main # given if has any questions or any issues. Pt states they will.

## 2023-12-08 ENCOUNTER — Encounter: Payer: Self-pay | Admitting: Gastroenterology

## 2023-12-08 ENCOUNTER — Ambulatory Visit: Admitting: Gastroenterology

## 2023-12-08 VITALS — BP 111/69 | HR 50 | Temp 97.5°F | Resp 13 | Ht 64.5 in | Wt 177.0 lb

## 2023-12-08 DIAGNOSIS — K641 Second degree hemorrhoids: Secondary | ICD-10-CM

## 2023-12-08 DIAGNOSIS — D122 Benign neoplasm of ascending colon: Secondary | ICD-10-CM

## 2023-12-08 DIAGNOSIS — Z8719 Personal history of other diseases of the digestive system: Secondary | ICD-10-CM | POA: Diagnosis not present

## 2023-12-08 DIAGNOSIS — K644 Residual hemorrhoidal skin tags: Secondary | ICD-10-CM | POA: Diagnosis not present

## 2023-12-08 DIAGNOSIS — Z1211 Encounter for screening for malignant neoplasm of colon: Secondary | ICD-10-CM | POA: Diagnosis not present

## 2023-12-08 MED ORDER — SODIUM CHLORIDE 0.9 % IV SOLN: 500.0000 mL | Freq: Once | INTRAVENOUS | Status: DC

## 2023-12-08 NOTE — Progress Notes (Signed)
 Pt's states no medical or surgical changes since previsit or office visit.

## 2023-12-08 NOTE — Progress Notes (Signed)
 A/o x 3, VSS, good SR's, pleased with anesthesia, report to RN

## 2023-12-08 NOTE — Op Note (Signed)
 [facility] Patient Name: Amanda Dickerson Procedure Date: 12/08/2023 8:46 AM MRN: [id number] Endoscopist: Aloha Finner , MD, [id number] Age: 63 Referring MD:  Date of Birth: 11-Jan-1961 Gender: Female Account #: [id number] Procedure:                Colonoscopy Indications:              High risk colon cancer surveillance: Crohn's                            colitis of 8 (or more) years duration with                            one-third (or more) of the colon involved Medicines:                Monitored Anesthesia Care Procedure:                Pre-Anesthesia Assessment:                           - Prior to the procedure, a History and Physical                            was performed, and patient medications and                            allergies were reviewed. The patient's tolerance of                            previous anesthesia was also reviewed. The risks                            and benefits of the procedure and the sedation                            options and risks were discussed with the patient.                            All questions were answered, and informed consent                            was obtained. Prior Anticoagulants: The patient has                            taken no anticoagulant or antiplatelet agents. ASA                            Grade Assessment: II - A patient with mild systemic                            disease. After reviewing the risks and benefits,                            the patient was deemed in satisfactory condition to  undergo the procedure.                           After obtaining informed consent, the colonoscope                            was passed under direct vision. Throughout the                            procedure, the patient's blood pressure, pulse, and                            oxygen saturations were monitored continuously. The                            Olympus Scope SN:  [id number] was introduced through                            the anus and advanced to the the cecum, identified                            by the appendiceal orifice, ileocecal valve and                            palpation. The colonoscopy was performed without                            difficulty. The patient tolerated the procedure.                            The quality of the bowel preparation was good. The                            terminal ileum, ileocecal valve, appendiceal                            orifice, and rectum were photographed. Scope In: 8:57:19 AM Scope Out: 9:13:56 AM Scope Withdrawal Time: 0 hours 14 minutes 24 seconds  Total Procedure Duration: 0 hours 16 minutes 37 seconds  Findings:                 The digital rectal exam findings include                            hemorrhoids. Pertinent negatives include no                            palpable rectal lesions.                           The terminal ileum and ileocecal valve appeared                            normal. Biopsies were taken with a cold forceps for  histology.                           A 2 mm polyp was found in the ascending colon. The                            polyp was sessile. The polyp was removed with a                            cold snare. Resection and retrieval were complete.                           Normal mucosa was found in the entire colon                            otherwise. Surveillance biopsies performed.                            Biopsies were taken with a cold forceps for                            histology from the right colon. Biopsies were taken                            with a cold forceps for histology from the left                            colon. Biopsies were taken with a cold forceps for                            histology from the rectum.                           Non-bleeding non-thrombosed internal hemorrhoids                             were found during retroflexion, during perianal                            exam and during digital exam. The hemorrhoids were                            Grade II (internal hemorrhoids that prolapse but                            reduce spontaneously). Complications:            No immediate complications. Estimated Blood Loss:     Estimated blood loss was minimal. Impression:               - Hemorrhoids found on digital rectal exam.                           - The examined portion of the ileum was normal.  Biopsied.                           - One 2 mm polyp in the ascending colon, removed                            with a cold snare. Resected and retrieved.                           - Normal mucosa in the entire examined colon.                            Biopsied.                           - Non-bleeding non-thrombosed internal hemorrhoids. Recommendation:           - The patient will be observed post-procedure,                            until all discharge criteria are met.                           - Discharge patient to home.                           - Patient has a contact number available for                            emergencies. The signs and symptoms of potential                            delayed complications were discussed with the                            patient. Return to normal activities tomorrow.                            Written discharge instructions were provided to the                            patient.                           - High fiber diet.                           - Use FiberCon 1-2 tablets PO daily.                           - Continue present medications.                           - Await pathology results.                           - Repeat colonoscopy for surveillance based on  pathology results.                           - The findings and recommendations were discussed                             with the patient.                           - The findings and recommendations were discussed                            with the patient's family. Aloha Finner, MD 12/08/2023 9:18:11 AM

## 2023-12-08 NOTE — Progress Notes (Signed)
 GASTROENTEROLOGY PROCEDURE H&P NOTE   Primary Care Physician: Stoney Blizzard, DO  HPI: Amanda Dickerson is a 63 y.o. female who presents for Colonoscopy for history of Crohn's disease surveillance.  Past Medical History:  Diagnosis Date   Anemia    Anxiety    Arthritis    KNEES   Asthma    Colitis    Dysfunctional uterine bleeding    Fibromyalgia    GERD (gastroesophageal reflux disease)    Headache(784.0)    History of cardiovascular stress test    ETT-Myoview  7/16:  EF 71%, no ischemia or infarct; Low Risk   Past Surgical History:  Procedure Laterality Date   CESAREAN SECTION  1988   COLONOSCOPY     ENDOMETRIAL ABLATION W/ NOVASURE  08/2011   MULTIPLE TOOTH EXTRACTIONS     TUBAL LIGATION  2000   Current Outpatient Medications  Medication Sig Dispense Refill   amLODipine  (NORVASC ) 5 MG tablet Take 1 tablet (5 mg total) by mouth daily. 90 tablet 3   atorvastatin  (LIPITOR) 40 MG tablet Take 1 tablet (40 mg total) by mouth daily. 90 tablet 3   conjugated estrogens  (PREMARIN ) vaginal cream Place 1 Applicatorful vaginally once a week. (Patient not taking: Reported on 11/24/2023) 42.5 g 3   cyclobenzaprine  (FLEXERIL ) 5 MG tablet Take 0.5-1 tablets (2.5-5 mg total) by mouth at bedtime. (Patient not taking: Reported on 11/24/2023) 30 tablet 1   DULERA  100-5 MCG/ACT AERO INHALE 2 PUFFS INTO THE LUNGS FOR WHEEZING OR SHORTNESS OF BREATH 13 each 3   famotidine  (PEPCID ) 20 MG tablet Take 1 tablet (20 mg total) by mouth daily as needed for heartburn or indigestion. 90 tablet 0   mesalamine  (LIALDA ) 1.2 g EC tablet Take 2 tablets (2.4 g total) by mouth daily with breakfast. 180 tablet 4   metoprolol  succinate (TOPROL  XL) 25 MG 24 hr tablet Take 1 tablet (25 mg total) by mouth daily. 90 tablet 3   omeprazole  (PRILOSEC) 20 MG capsule TAKE 1 CAPSULE BY MOUTH EVERY DAY (Patient taking differently: Take by mouth as needed.) 90 capsule 1   polyethylene glycol powder (GLYCOLAX /MIRALAX ) 17  GM/SCOOP powder Take 17 g by mouth daily. Dissolve 1 capful (17g) in 4-8 ounces of liquid and take by mouth daily.     venlafaxine  (EFFEXOR ) 75 MG tablet TAKE 1 TABLET BY MOUTH TWICE A DAY 180 tablet 1   Current Facility-Administered Medications  Medication Dose Route Frequency Provider Last Rate Last Admin   0.9 %  sodium chloride  infusion  500 mL Intravenous Once Mansouraty, Zniya Cottone Jr., MD        Current Outpatient Medications:    amLODipine  (NORVASC ) 5 MG tablet, Take 1 tablet (5 mg total) by mouth daily., Disp: 90 tablet, Rfl: 3   atorvastatin  (LIPITOR) 40 MG tablet, Take 1 tablet (40 mg total) by mouth daily., Disp: 90 tablet, Rfl: 3   conjugated estrogens  (PREMARIN ) vaginal cream, Place 1 Applicatorful vaginally once a week. (Patient not taking: Reported on 11/24/2023), Disp: 42.5 g, Rfl: 3   cyclobenzaprine  (FLEXERIL ) 5 MG tablet, Take 0.5-1 tablets (2.5-5 mg total) by mouth at bedtime. (Patient not taking: Reported on 11/24/2023), Disp: 30 tablet, Rfl: 1   DULERA  100-5 MCG/ACT AERO, INHALE 2 PUFFS INTO THE LUNGS FOR WHEEZING OR SHORTNESS OF BREATH, Disp: 13 each, Rfl: 3   famotidine  (PEPCID ) 20 MG tablet, Take 1 tablet (20 mg total) by mouth daily as needed for heartburn or indigestion., Disp: 90 tablet, Rfl: 0   mesalamine  (  LIALDA ) 1.2 g EC tablet, Take 2 tablets (2.4 g total) by mouth daily with breakfast., Disp: 180 tablet, Rfl: 4   metoprolol  succinate (TOPROL  XL) 25 MG 24 hr tablet, Take 1 tablet (25 mg total) by mouth daily., Disp: 90 tablet, Rfl: 3   omeprazole  (PRILOSEC) 20 MG capsule, TAKE 1 CAPSULE BY MOUTH EVERY DAY (Patient taking differently: Take by mouth as needed.), Disp: 90 capsule, Rfl: 1   polyethylene glycol powder (GLYCOLAX /MIRALAX ) 17 GM/SCOOP powder, Take 17 g by mouth daily. Dissolve 1 capful (17g) in 4-8 ounces of liquid and take by mouth daily., Disp: , Rfl:    venlafaxine  (EFFEXOR ) 75 MG tablet, TAKE 1 TABLET BY MOUTH TWICE A DAY, Disp: 180 tablet, Rfl:  1  Current Facility-Administered Medications:    0.9 %  sodium chloride  infusion, 500 mL, Intravenous, Once, Mansouraty, Aloha Raddle., MD Allergies  Allergen Reactions   Ibuprofen  Other (See Comments)    Upsets stomach   Penicillins Nausea And Vomiting   Family History  Problem Relation Age of Onset   Hypertension Mother    Heart disease Mother    Hyperlipidemia Mother    Other Mother        vertigo   Colon polyps Mother    Diabetes Father    Prostate cancer Father    Colon polyps Sister    Esophageal cancer Neg Hx    Colon cancer Neg Hx    Stomach cancer Neg Hx    Rectal cancer Neg Hx    Inflammatory bowel disease Neg Hx    Liver disease Neg Hx    Pancreatic cancer Neg Hx    Breast cancer Neg Hx    BRCA 1/2 Neg Hx    Social History   Socioeconomic History   Marital status: Divorced    Spouse name: Not on file   Number of children: 1   Years of education: 12   Highest education level: Not on file  Occupational History   Occupation: Disabled  Tobacco Use   Smoking status: Never    Passive exposure: Never   Smokeless tobacco: Never  Vaping Use   Vaping status: Never Used  Substance and Sexual Activity   Alcohol use: Yes    Comment: occasionally   Drug use: No   Sexual activity: Yes    Birth control/protection: Surgical  Other Topics Concern   Not on file  Social History Narrative   Lives alone   Caffeine - rarely   Social Drivers of Health   Financial Resource Strain: Low Risk  (04/27/2023)   Overall Financial Resource Strain (CARDIA)    Difficulty of Paying Living Expenses: Not hard at all  Food Insecurity: No Food Insecurity (04/27/2023)   Hunger Vital Sign    Worried About Running Out of Food in the Last Year: Never true    Ran Out of Food in the Last Year: Never true  Transportation Needs: No Transportation Needs (04/27/2023)   PRAPARE - Administrator, Civil Service (Medical): No    Lack of Transportation (Non-Medical): No  Physical  Activity: Inactive (04/27/2023)   Exercise Vital Sign    Days of Exercise per Week: 0 days    Minutes of Exercise per Session: 0 min  Stress: No Stress Concern Present (04/27/2023)   [employer] of Occupational Health - Occupational Stress Questionnaire    Feeling of Stress : Not at all  Social Connections: Moderately Isolated (04/27/2023)   Social Connection and Isolation Panel  Frequency of Communication with Friends and Family: More than three times a week    Frequency of Social Gatherings with Friends and Family: More than three times a week    Attends Religious Services: More than 4 times per year    Active Member of [employer] or Organizations: No    Attends Banker Meetings: Never    Marital Status: Widowed  Intimate Partner Violence: Not At Risk (04/27/2023)   Humiliation, Afraid, Rape, and Kick questionnaire    Fear of Current or Ex-Partner: No    Emotionally Abused: No    Physically Abused: No    Sexually Abused: No    Physical Exam: Today's Vitals   12/08/23 0749  BP: 121/78  Pulse: (!) 58  Temp: (!) 97.5 F (36.4 C)  TempSrc: Temporal  SpO2: 100%  Weight: 177 lb (80.3 kg)  Height: 5' 4.5 (1.638 m)   Body mass index is 29.91 kg/m. GEN: NAD EYE: Sclerae anicteric ENT: MMM CV: Non-tachycardic GI: Soft, NT/ND NEURO:  Alert & Oriented x 3  Lab Results: No results for input(s): WBC, HGB, HCT, PLT in the last 72 hours. BMET No results for input(s): NA, K, CL, CO2, GLUCOSE, BUN, CREATININE, CALCIUM  in the last 72 hours. LFT No results for input(s): PROT, ALBUMIN, AST, ALT, ALKPHOS, BILITOT, BILIDIR, IBILI in the last 72 hours. PT/INR No results for input(s): LABPROT, INR in the last 72 hours.   Impression / Plan: This is a 63 y.o.female who presents for Colonoscopy for history of Crohn's disease surveillance.  The risks and benefits of endoscopic evaluation/treatment were discussed with the patient  and/or family; these include but are not limited to the risk of perforation, infection, bleeding, missed lesions, lack of diagnosis, severe illness requiring hospitalization, as well as anesthesia and sedation related illnesses.  The patient's history has been reviewed, patient examined, no change in status, and deemed stable for procedure.  The patient and/or family is agreeable to proceed.    Aloha Finner, MD [facility] Office # [phone]

## 2023-12-08 NOTE — Progress Notes (Signed)
 Called to room to assist during endoscopic procedure.  Patient ID and intended procedure confirmed with present staff. Received instructions for my participation in the procedure from the performing physician.

## 2023-12-08 NOTE — Patient Instructions (Signed)
 Thank you for letting us  care for your healthcare needs today! Please see handouts regarding Polyps, Hemorrhoids, and High Fiber Diet. Use FiberCon 1-2 tablets daily. Continue current medications. Await pathology results.   YOU HAD AN ENDOSCOPIC PROCEDURE TODAY AT THE  ENDOSCOPY CENTER:   Refer to the procedure report that was given to you for any specific questions about what was found during the examination.  If the procedure report does not answer your questions, please call your gastroenterologist to clarify.  If you requested that your care partner not be given the details of your procedure findings, then the procedure report has been included in a sealed envelope for you to review at your convenience later.  YOU SHOULD EXPECT: Some feelings of bloating in the abdomen. Passage of more gas than usual.  Walking can help get rid of the air that was put into your GI tract during the procedure and reduce the bloating. If you had a lower endoscopy (such as a colonoscopy or flexible sigmoidoscopy) you may notice spotting of blood in your stool or on the toilet paper. If you underwent a bowel prep for your procedure, you may not have a normal bowel movement for a few days.  Please Note:  You might notice some irritation and congestion in your nose or some drainage.  This is from the oxygen used during your procedure.  There is no need for concern and it should clear up in a day or so.  SYMPTOMS TO REPORT IMMEDIATELY:  Following lower endoscopy (colonoscopy or flexible sigmoidoscopy):  Excessive amounts of blood in the stool  Significant tenderness or worsening of abdominal pains  Swelling of the abdomen that is new, acute  Fever of 100F or higher  For urgent or emergent issues, a gastroenterologist can be reached at any hour by calling [phone]. Do not use MyChart messaging for urgent concerns.    DIET:  We do recommend a small meal at first, but then you may proceed to your regular  diet.  Drink plenty of fluids but you should avoid alcoholic beverages for 24 hours.  ACTIVITY:  You should plan to take it easy for the rest of today and you should NOT DRIVE or use heavy machinery until tomorrow (because of the sedation medicines used during the test).    FOLLOW UP: Our staff will call the number listed on your records the next business day following your procedure.  We will call around 7:15- 8:00 am to check on you and address any questions or concerns that you may have regarding the information given to you following your procedure. If we do not reach you, we will leave a message.     If any biopsies were taken you will be contacted by phone or by letter within the next 1-3 weeks.  Please call us  at [phone] if you have not heard about the biopsies in 3 weeks.    SIGNATURES/CONFIDENTIALITY: You and/or your care partner have signed paperwork which will be entered into your electronic medical record.  These signatures attest to the fact that that the information above on your After Visit Summary has been reviewed and is understood.  Full responsibility of the confidentiality of this discharge information lies with you and/or your care-partner.

## 2023-12-09 ENCOUNTER — Telehealth: Payer: Self-pay | Admitting: *Deleted

## 2023-12-09 NOTE — Telephone Encounter (Signed)
  Follow up Call-     12/08/2023    7:49 AM  Call back number  Post procedure Call Back phone  # [phone]  Permission to leave phone message Yes     Patient questions:  Do you have a fever, pain , or abdominal swelling? No. Pain Score  0 *  Have you tolerated food without any problems? Yes.    Have you been able to return to your normal activities? Yes.    Do you have any questions about your discharge instructions: Diet   No. Medications  No. Follow up visit  No.  Do you have questions or concerns about your Care? No.  Actions: * If pain score is 4 or above: No action needed, pain <4.

## 2023-12-11 LAB — SURGICAL PATHOLOGY

## 2023-12-16 ENCOUNTER — Ambulatory Visit: Payer: Self-pay | Admitting: Gastroenterology

## 2024-01-01 ENCOUNTER — Telehealth: Payer: Self-pay | Admitting: Gastroenterology

## 2024-01-01 ENCOUNTER — Telehealth: Payer: Self-pay

## 2024-01-01 NOTE — Telephone Encounter (Signed)
 Inbound call from patient stating that she was looking over her insurance things for next year and believes that her insurance will not cover  Lialda . Patient is requesting a call to discuss if there is a different medication just in case. Please advise.

## 2024-01-01 NOTE — Telephone Encounter (Signed)
 The pt is going to call her insurance and see what they will cover if the lialda  is not going to be on formulary. She will call back if needed.

## 2024-01-01 NOTE — Telephone Encounter (Signed)
 Patient calls nurse line due to coverage issues with Venlafaxine  in 2026.   Patient reports that she was scanning her medications into coverage verification and she received message that this would not be covered next year.   Advised her that I would message our pharmacy team, however, we may need to wait until rejection to determine alternatives/ PA, etc.   Patient voices understanding.   Chiquita JAYSON English, RN

## 2024-01-03 ENCOUNTER — Other Ambulatory Visit: Payer: Self-pay | Admitting: Family Medicine

## 2024-01-04 ENCOUNTER — Telehealth: Payer: Self-pay

## 2024-01-04 NOTE — Telephone Encounter (Signed)
 Patient calls nurse line in regards to Dulera .  She reports issues picking this medication up. She reports no stock of the medication.   I called [facility], they report the shipment should be in tomorrow.   I called patient. She reports they will not fill until 11/14, she reports she received her last refill on 10/14. She reports she is out.   She reports she has been using the inhaler more directed.   Patient advised she would need an apt to discuss.  She was speaking in full sentences. No red flags.   Patient scheduled for 11/7 for medication management.   ED precautions discussed.

## 2024-01-08 ENCOUNTER — Ambulatory Visit (INDEPENDENT_AMBULATORY_CARE_PROVIDER_SITE_OTHER): Admitting: Family Medicine

## 2024-01-08 VITALS — BP 114/60 | HR 59 | Ht 65.0 in | Wt 179.6 lb

## 2024-01-08 DIAGNOSIS — J4520 Mild intermittent asthma, uncomplicated: Secondary | ICD-10-CM | POA: Diagnosis not present

## 2024-01-08 DIAGNOSIS — Z0000 Encounter for general adult medical examination without abnormal findings: Secondary | ICD-10-CM

## 2024-01-08 MED ORDER — BUDESONIDE-FORMOTEROL FUMARATE 160-4.5 MCG/ACT IN AERO: 2.0000 | INHALATION_SPRAY | Freq: Two times a day (BID) | RESPIRATORY_TRACT | 12 refills | Status: AC | PRN

## 2024-01-08 NOTE — Progress Notes (Signed)
    SUBJECTIVE:   CHIEF COMPLAINT / HPI:   Issues with Dulera , pharmacy doesn't keep it in stock and she ran out Uses as needed for mild asthma Symptoms maybe a bit worse at night in the winter   Declines flu and COVID vaccines today  PERTINENT  PMH / PSH: reviewed  OBJECTIVE:   BP 114/60   Pulse (!) 59   Ht 5' 5 (1.651 m)   Wt 179 lb 9.6 oz (81.5 kg)   LMP  (LMP Unknown)   SpO2 98%   BMI 29.89 kg/m   - General: No acute distress. Awake and conversant. - Eyes: Normal conjunctiva, anicteric. Round symmetric pupils. - ENT: Hearing grossly intact. No nasal discharge. - Neck: Neck is supple. No masses or thyromegaly. - Respiratory: Normal WOB on RA - Psych: Alert and oriented. Cooperative, Appropriate mood and affect, Normal judgment. - MSK: Normal ambulation. - Neuro: Sensation and CN II-XII grossly normal.   ASSESSMENT/PLAN:   Assessment & Plan Mild intermittent asthma without complication Due to issues obtaining dulera  at pharmacy, will switch to Symbicort at similar dose. Advised pt to contact clinic if ongoing issues occur.  - budesonide -formoterol  (SYMBICORT) 160-4.5 MCG/ACT inhaler; Inhale 2 puffs into the lungs 2 (two) times daily as needed (for wheezing or shortness of breath).  Dispense: 1 each; Refill: 12 Healthcare maintenance Advised Flu/COVID vaccines today, pt declined. Advised shingles vaccine, can ask for this at pharmacy.      Rea Raring, MD [facility]

## 2024-01-08 NOTE — Patient Instructions (Addendum)
 Thank you for coming in today! Here is a summary of what we discussed:  -I sent in Symbicort for you to use as needed instead of Dulera   -You can get the Shingrix vaccine at the pharmacy. You will need a 2nd shot 2-6 months after the first. This vaccine is important to help prevent shingles.    -You should also schedule appt with podiatry and ophthalmology for diabetic checks  Please call the clinic at [phone] if your symptoms worsen or you have any concerns.  Best, Dr Adele

## 2024-01-08 NOTE — Assessment & Plan Note (Signed)
 Due to issues obtaining dulera  at pharmacy, will switch to Symbicort at similar dose. Advised pt to contact clinic if ongoing issues occur.  - budesonide -formoterol  (SYMBICORT) 160-4.5 MCG/ACT inhaler; Inhale 2 puffs into the lungs 2 (two) times daily as needed (for wheezing or shortness of breath).  Dispense: 1 each; Refill: 12

## 2024-03-04 ENCOUNTER — Telehealth: Payer: Self-pay | Admitting: Cardiology

## 2024-03-04 DIAGNOSIS — E785 Hyperlipidemia, unspecified: Secondary | ICD-10-CM

## 2024-03-04 DIAGNOSIS — T466X5A Adverse effect of antihyperlipidemic and antiarteriosclerotic drugs, initial encounter: Secondary | ICD-10-CM

## 2024-03-04 NOTE — Telephone Encounter (Signed)
 Pt c/o medication issue:  1. Name of Medication: atorvastatin  (LIPITOR) 40 MG tablet   2. How are you currently taking this medication (dosage and times per day)?   Take 1 tablet (40 mg total) by mouth daily.    3. Are you having a reaction (difficulty breathing--STAT)? No   4. What is your medication issue? Pt was having leg pain on rosuvastatin , her PCP changed her to atorvastatin . Leg pain has continued and worsened. She can hardly walk. Pt would like c/b to discuss. Please advise.

## 2024-03-04 NOTE — Telephone Encounter (Signed)
 Spoke with patient of Dr. Kate. PCP changed from crestor  >> lipitor 10/2023. Headaches previously reported likely due to sinuses per patient. She reports suffering with muscle cramping since early November, limping with R>L. Muscle aches in UE as well. Has fibromyalgia as well. Impacting sleep.   Advised 2 week statin holiday.   Will send message to MD to review/advise

## 2024-03-06 NOTE — Telephone Encounter (Signed)
 Agree with holding atorvastatin .  Recommend referral to pharmacy lipid clinic to evaluate for PCSK9 inhibitor

## 2024-03-07 NOTE — Telephone Encounter (Signed)
 MyChart message sent to patient w/update from MD Referral ordered.

## 2024-03-17 ENCOUNTER — Telehealth: Payer: Self-pay | Admitting: Cardiology

## 2024-03-17 ENCOUNTER — Other Ambulatory Visit: Payer: Self-pay | Admitting: Cardiology

## 2024-03-17 DIAGNOSIS — R002 Palpitations: Secondary | ICD-10-CM

## 2024-03-17 NOTE — Telephone Encounter (Signed)
*  STAT* If patient is at the pharmacy, call can be transferred to refill team.   1. Which medications need to be refilled? (please list name of each medication and dose if known) metoprolol succinate (TOPROL XL) 25 MG 24 hr tablet   2. Which pharmacy/location (including street and city if local pharmacy) is medication to be sent to?[id number] [place], [place] - [address]   3. Do they need a 30 day or 90 day supply? 90 day

## 2024-03-23 MED ORDER — METOPROLOL SUCCINATE ER 25 MG PO TB24: 25.0000 mg | ORAL_TABLET | Freq: Every day | ORAL | 0 refills | Status: AC

## 2024-03-23 NOTE — Telephone Encounter (Signed)
Pt's medication was sent to pt's pharmacy a requested. Confirmation received.  

## 2024-04-20 ENCOUNTER — Ambulatory Visit

## 2024-05-06 ENCOUNTER — Ambulatory Visit: Admitting: Gastroenterology

## 2024-05-18 ENCOUNTER — Encounter: Payer: Self-pay | Admitting: Family Medicine

## 2024-08-03 ENCOUNTER — Ambulatory Visit: Admitting: Cardiology
